# Patient Record
Sex: Female | Born: 1990 | Hispanic: No | Marital: Single | Smoking: Former smoker
Health system: Southern US, Community
[De-identification: ages and names within clinical notes are randomized; demographics above are authoritative.]

## PROBLEM LIST (undated history)

## (undated) ENCOUNTER — Inpatient Hospital Stay: Payer: Self-pay

## (undated) DIAGNOSIS — F32A Depression, unspecified: Secondary | ICD-10-CM

## (undated) DIAGNOSIS — R569 Unspecified convulsions: Secondary | ICD-10-CM

## (undated) DIAGNOSIS — F419 Anxiety disorder, unspecified: Secondary | ICD-10-CM

## (undated) HISTORY — DX: Depression, unspecified: F32.A

## (undated) HISTORY — DX: Anxiety disorder, unspecified: F41.9

---

## 2013-04-05 DIAGNOSIS — G40909 Epilepsy, unspecified, not intractable, without status epilepticus: Secondary | ICD-10-CM

## 2013-04-05 HISTORY — DX: Epilepsy, unspecified, not intractable, without status epilepticus: G40.909

## 2016-03-05 NOTE — L&D Delivery Note (Signed)
Delivery Note At 9:33 PM a viable female was delivered via VBAC, Spontaneous (Presentation: vtx Milon Dikes/loa;  ).  APGAR: 9, 9; weight  .  Pushed for 15 minutes  With good effort . Controlled delivery of shoulders and body . Vigorous cry . Delayed cord clamping    Placenta status:intact  , .  Cord:  with the following complications: left periurethral laceration .  Cord pH: not done   Anesthesia:  CLE and lidocaine local  Episiotomy:  None  Lacerations:  Left periurethral laceration  Suture Repair: 2.0 3.0 vicryl Est. Blood Loss (mL):  700  Mom to postpartum.  Baby to Couplet care / Skin to Skin.  Natalie Lawrence 01/10/2017, 10:02 PM

## 2016-03-21 ENCOUNTER — Emergency Department: Payer: Self-pay

## 2016-03-21 ENCOUNTER — Encounter: Payer: Self-pay | Admitting: Emergency Medicine

## 2016-03-21 ENCOUNTER — Emergency Department
Admission: EM | Admit: 2016-03-21 | Discharge: 2016-03-21 | Disposition: A | Discharge status: Home / Self Care | Payer: Self-pay | Attending: Emergency Medicine | Admitting: Emergency Medicine

## 2016-03-21 DIAGNOSIS — X58XXXA Exposure to other specified factors, initial encounter: Secondary | ICD-10-CM | POA: Insufficient documentation

## 2016-03-21 DIAGNOSIS — Y999 Unspecified external cause status: Secondary | ICD-10-CM | POA: Insufficient documentation

## 2016-03-21 DIAGNOSIS — Y929 Unspecified place or not applicable: Secondary | ICD-10-CM | POA: Insufficient documentation

## 2016-03-21 DIAGNOSIS — F17200 Nicotine dependence, unspecified, uncomplicated: Secondary | ICD-10-CM | POA: Insufficient documentation

## 2016-03-21 DIAGNOSIS — R55 Syncope and collapse: Secondary | ICD-10-CM | POA: Insufficient documentation

## 2016-03-21 DIAGNOSIS — Y93E1 Activity, personal bathing and showering: Secondary | ICD-10-CM | POA: Insufficient documentation

## 2016-03-21 DIAGNOSIS — S0511XA Contusion of eyeball and orbital tissues, right eye, initial encounter: Secondary | ICD-10-CM | POA: Insufficient documentation

## 2016-03-21 DIAGNOSIS — S0011XA Contusion of right eyelid and periocular area, initial encounter: Secondary | ICD-10-CM

## 2016-03-21 HISTORY — DX: Unspecified convulsions: R56.9

## 2016-03-21 LAB — BASIC METABOLIC PANEL
Anion gap: 4 — ABNORMAL LOW (ref 5–15)
BUN: 9 mg/dL (ref 6–20)
CHLORIDE: 107 mmol/L (ref 101–111)
CO2: 28 mmol/L (ref 22–32)
CREATININE: 0.79 mg/dL (ref 0.44–1.00)
Calcium: 8.8 mg/dL — ABNORMAL LOW (ref 8.9–10.3)
GFR calc Af Amer: >60 mL/min (ref >60)
Glucose, Bld: 87 mg/dL (ref 65–99)
Potassium: 3.4 mmol/L — ABNORMAL LOW (ref 3.5–5.1)
SODIUM: 139 mmol/L (ref 135–145)

## 2016-03-21 LAB — URINALYSIS, COMPLETE (UACMP) WITH MICROSCOPIC
Bacteria, UA: NONE SEEN
Bilirubin Urine: NEGATIVE
Glucose, UA: NEGATIVE mg/dL
Ketones, ur: NEGATIVE mg/dL
Nitrite: NEGATIVE
PH: 5 (ref 5.0–8.0)
Protein, ur: 30 mg/dL — AB
SPECIFIC GRAVITY, URINE: 1.028 (ref 1.005–1.030)

## 2016-03-21 LAB — CBC
HCT: 36.7 % (ref 35.0–47.0)
Hemoglobin: 11.9 g/dL — ABNORMAL LOW (ref 12.0–16.0)
MCH: 28.4 pg (ref 26.0–34.0)
MCHC: 32.5 g/dL (ref 32.0–36.0)
MCV: 87.2 fL (ref 80.0–100.0)
PLATELETS: 244 10*3/uL (ref 150–440)
RBC: 4.21 MIL/uL (ref 3.80–5.20)
RDW: 13.1 % (ref 11.5–14.5)
WBC: 10.4 10*3/uL (ref 3.6–11.0)

## 2016-03-21 LAB — PREGNANCY, URINE: Preg Test, Ur: NEGATIVE

## 2016-03-21 NOTE — ED Triage Notes (Addendum)
Pt ambulatory to triage with steady gait, no distress noted. Pt reports she was getting ready for work when she had a syncopal episode, sts she hit her head, denies LOC. Hx of epilepsy, takes medication on daily basis.

## 2016-03-21 NOTE — ED Provider Notes (Signed)
[facility] Emergency Department Provider Note   First MD Initiated Contact with Patient 03/21/16 0222     (approximate)  I have reviewed the triage vital signs and the nursing notes.   HISTORY  Chief Complaint Dizziness    HPI Natalie Lawrence is a 26 y.o. female history of seizure disorder presents to the emergency department following a syncopal episode. Patient states that following taking a shower she abruptly lost consciousness. Patient denies any preceding symptoms. Patient does admit to right facial/periorbital pain at this time. Patient states her current pain score is 3 out of 10. Patient denies any chest pain or shortness of breath or dizziness no weakness numbness or gait instability. Patient denies any visual changes. Patient denies any palpitations. Patient states that his syncopal episode was witnessed by her boyfriend who is not present in the emergency department at this time however she states that he denied with this in any seizure-like activity. She admits to being compliant with Keppra   Past Medical History:  Diagnosis Date  . Seizures (HCC)     There are no active problems to display for this patient.   Surgical history None  Prior to Admission medications   Not on File    Allergies No known drug allergies History reviewed. No pertinent family history.  Social History Social History  Substance Use Topics  . Smoking status: Current Every Day Smoker  . Smokeless tobacco: Never Used  . Alcohol use No    Review of Systems Constitutional: No fever/chills Eyes: No visual changes. ENT: No sore throat. Cardiovascular: Denies chest pain. Respiratory: Denies shortness of breath. Gastrointestinal: No abdominal pain.  No nausea, no vomiting.  No diarrhea.  No constipation. Genitourinary: Negative for dysuria. Musculoskeletal: Negative for back pain. Skin: Negative for rash. Neurological: Negative for headaches, focal  weakness or numbness.Positive for syncope   10-point ROS otherwise negative.  ____________________________________________   PHYSICAL EXAM:  VITAL SIGNS: ED Triage Vitals  Enc Vitals Group     BP 03/21/16 0100 114/90     Pulse Rate 03/21/16 0100 75     Resp 03/21/16 0100 16     Temp 03/21/16 0100 98 F (36.7 C)     Temp Source 03/21/16 0100 Oral     SpO2 03/21/16 0100 100 %     Weight 03/21/16 0101 168 lb (76.2 kg)     Height 03/21/16 0101 5\' 6"  (1.676 m)     Head Circumference --      Peak Flow --      Pain Score 03/21/16 0406 3     Pain Loc --      Pain Edu? --      Excl. in GC? --     Constitutional: Alert and oriented. Well appearing and in no acute distress. Eyes: Conjunctivae are normal. PERRL. EOMI. Head: Right periorbital ecchymoses Ears:  Healthy appearing ear canals and TMs bilaterally Nose: No congestion/rhinnorhea. Mouth/Throat: Mucous membranes are moist.  Oropharynx non-erythematous. Neck: No stridor. No cervical spine tenderness to palpation. Cardiovascular: Normal rate, regular rhythm. Good peripheral circulation. Grossly normal heart sounds. Respiratory: Normal respiratory effort.  No retractions. Lungs CTAB. Gastrointestinal: Soft and nontender. No distention.  Musculoskeletal: No lower extremity tenderness nor edema. No gross deformities of extremities. Neurologic:  Normal speech and language. No gross focal neurologic deficits are appreciated.  Skin:  Skin is warm, dry and intact. No rash noted. Psychiatric: Mood and affect are normal. Speech and behavior are normal.  ____________________________________________  LABS (all labs ordered are listed, but only abnormal results are displayed)  Labs Reviewed  BASIC METABOLIC PANEL - Abnormal; Notable for the following:       Result Value   Potassium 3.4 (*)    Calcium 8.8 (*)    Anion gap 4 (*)    All other components within normal limits  CBC - Abnormal; Notable for the following:    Hemoglobin  11.9 (*)    All other components within normal limits  URINALYSIS, COMPLETE (UACMP) WITH MICROSCOPIC - Abnormal; Notable for the following:    Color, Urine AMBER (*)    APPearance CLOUDY (*)    Hgb urine dipstick SMALL (*)    Protein, ur 30 (*)    Leukocytes, UA SMALL (*)    Squamous Epithelial / LPF TOO NUMEROUS TO COUNT (*)    All other components within normal limits  PREGNANCY, URINE  CBG MONITORING, ED   ____________________________________________  EKG  ED ECG REPORT I, Plevna N BROWN, the attending physician, personally viewed and interpreted this ECG.   Date: 03/21/2016  EKG Time: 1:07 AM  Rate: 69  Rhythm: Normal sinus rhythm  Axis: Normal  Intervals: Normal  ST&T Change: None  ____________________________________________  RADIOLOGY I, Iola N BROWN, personally viewed and evaluated these images (plain radiographs) as part of my medical decision making, as well as reviewing the written report by the radiologist.  Dg Chest 2 View  Result Date: 03/21/2016 CLINICAL DATA:  26 y/o  F; syncope with right periorbital contusion. EXAM: CHEST  2 VIEW COMPARISON:  None. FINDINGS: The heart size and mediastinal contours are within normal limits. Both lungs are clear. The visualized skeletal structures are unremarkable. IMPRESSION: No active cardiopulmonary disease. Electronically Signed   By: Mitzi Hansen M.D.   On: 03/21/2016 02:56   Ct Head Wo Contrast  Result Date: 03/21/2016 CLINICAL DATA:  Syncope with right periorbital contusion. No loss of consciousness. EXAM: CT HEAD WITHOUT CONTRAST TECHNIQUE: Contiguous axial images were obtained from the base of the skull through the vertex without intravenous contrast. COMPARISON:  None. FINDINGS: Brain: No evidence of acute infarction, hemorrhage, hydrocephalus, extra-axial collection or mass lesion/mass effect. Vascular: No hyperdense vessel or unexpected calcification. Skull: Normal. Negative for fracture or focal  lesion. Sinuses/Orbits: Paranasal sinuses and mastoid air cells are clear. The visualized orbits are unremarkable. The periorbital contusion is not seen by CT. Other: None. IMPRESSION: No acute intracranial abnormality or skull fracture. Electronically Signed   By: Rubye Oaks M.D.   On: 03/21/2016 03:30     Procedures     INITIAL IMPRESSION / ASSESSMENT AND PLAN / ED COURSE  Pertinent labs & imaging results that were available during my care of the patient were reviewed by me and considered in my medical decision making (see chart for details).  No clear etiology for the patient's syncopal episode identified the emergency department laboratory data and CT of the head is unremarkable   Clinical Course     ____________________________________________  FINAL CLINICAL IMPRESSION(S) / ED DIAGNOSES  Final diagnoses:  Syncope, unspecified syncope type  Periorbital ecchymosis of right eye, initial encounter     MEDICATIONS GIVEN DURING THIS VISIT:  Medications - No data to display   NEW OUTPATIENT MEDICATIONS STARTED DURING THIS VISIT:  There are no discharge medications for this patient.   There are no discharge medications for this patient.   There are no discharge medications for this patient.    Note:  This document was prepared  using Conservation officer, historic buildingsDragon voice recognition software and may include unintentional dictation errors.    Darci Currentandolph N Brown, MD 03/21/16 [phone]

## 2016-05-15 ENCOUNTER — Emergency Department
Admission: EM | Admit: 2016-05-15 | Discharge: 2016-05-15 | Disposition: A | Discharge status: Home / Self Care | Payer: Medicaid Other | Attending: Emergency Medicine | Admitting: Emergency Medicine

## 2016-05-15 ENCOUNTER — Encounter: Payer: Self-pay | Admitting: Emergency Medicine

## 2016-05-15 DIAGNOSIS — O209 Hemorrhage in early pregnancy, unspecified: Secondary | ICD-10-CM | POA: Insufficient documentation

## 2016-05-15 DIAGNOSIS — O4690 Antepartum hemorrhage, unspecified, unspecified trimester: Secondary | ICD-10-CM

## 2016-05-15 DIAGNOSIS — Z87891 Personal history of nicotine dependence: Secondary | ICD-10-CM | POA: Insufficient documentation

## 2016-05-15 DIAGNOSIS — Z3A01 Less than 8 weeks gestation of pregnancy: Secondary | ICD-10-CM | POA: Insufficient documentation

## 2016-05-15 DIAGNOSIS — R102 Pelvic and perineal pain: Secondary | ICD-10-CM | POA: Insufficient documentation

## 2016-05-15 LAB — ABO/RH: ABO/RH(D): B POS

## 2016-05-15 LAB — HCG, QUANTITATIVE, PREGNANCY: hCG, Beta Chain, Quant, S: 5652 m[IU]/mL — ABNORMAL HIGH (ref <5)

## 2016-05-15 NOTE — ED Triage Notes (Addendum)
Patient to ER for c/o very light vaginal spotting after fall today. Patient states she slipped on ice and fell on her butt. Patient also reports one vomiting episode that had a couple of streaks of blood after fall. Patient is currently [redacted] weeks pregnant.

## 2016-05-15 NOTE — ED Provider Notes (Signed)
[facility] Emergency Department Provider Note  ________________________________________   I have reviewed the triage vital signs and the nursing notes.   HISTORY  Chief Complaint Abdominal Pain and Vaginal Bleeding   History limited by: Not Limited   HPI Natalie Lawrence is a 26 y.o. female who presents to the emergency department today because of concerns for vaginal spotting after a fall. Patient states she fell down about 3 steps onto her buttocks. She states couple hours after that after urinating she noticed some blood. The patient is roughly [redacted] weeks pregnant. She did have ultrasound and workup performed 3 days ago at [facility]. This showed an intrauterine gestational sac. The patient denies any other vaginal discharge. In addition the patient states she has had a couple episodes of emesis today with some blood streaks in them. Patient has had emesis during this pregnancy. Denies any head injury during the fall.   Past Medical History:  Diagnosis Date  . Seizures (HCC)     There are no active problems to display for this patient.   Past Surgical History:  Procedure Laterality Date  . CESAREAN SECTION  2012    Prior to Admission medications   Not on File    Allergies Patient has no known allergies.  No family history on file.  Social History Social History  Substance Use Topics  . Smoking status: Former Games developer  . Smokeless tobacco: Never Used  . Alcohol use No    Review of Systems  Constitutional: Negative for fever. Cardiovascular: Negative for chest pain. Respiratory: Negative for shortness of breath. Gastrointestinal: Positive for pelvic cramping. Neurological: Negative for headaches, focal weakness or numbness.  10-point ROS otherwise negative.  ____________________________________________   PHYSICAL EXAM:  VITAL SIGNS: ED Triage Vitals  Enc Vitals Group     BP 05/15/16 1944 (!) 142/85     Pulse Rate 05/15/16  1944 83     Resp 05/15/16 1944 18     Temp 05/15/16 1944 99.5 F (37.5 C)     Temp Source 05/15/16 1944 Oral     SpO2 05/15/16 1944 100 %     Weight 05/15/16 1945 172 lb (78 kg)     Height 05/15/16 1945 5\' 6"  (1.676 m)     Head Circumference --      Peak Flow --      Pain Score 05/15/16 1945 10   Constitutional: Alert and oriented. Well appearing and in no distress. Eyes: Conjunctivae are normal. Normal extraocular movements. ENT   Head: Normocephalic and atraumatic.   Nose: No congestion/rhinnorhea.   Mouth/Throat: Mucous membranes are moist.   Neck: No stridor. Hematological/Lymphatic/Immunilogical: No cervical lymphadenopathy. Cardiovascular: Normal rate, regular rhythm.  No murmurs, rubs, or gallops.  Respiratory: Normal respiratory effort without tachypnea nor retractions. Breath sounds are clear and equal bilaterally. No wheezes/rales/rhonchi. Gastrointestinal: Soft and non tender. No rebound. No guarding.  Genitourinary: Deferred Musculoskeletal: Normal range of motion in all extremities. No lower extremity edema. No spinal tenderness. Neurologic:  Normal speech and language. No gross focal neurologic deficits are appreciated.  Skin:  Skin is warm, dry and intact. No rash noted. Psychiatric: Mood and affect are normal. Speech and behavior are normal. Patient exhibits appropriate insight and judgment.  ____________________________________________    LABS (pertinent positives/negatives)  Labs Reviewed  HCG, QUANTITATIVE, PREGNANCY - Abnormal; Notable for the following:       Result Value   hCG, Beta Chain, Quant, S 5,652 (*)    All other components  within normal limits  ABO/RH     ____________________________________________   EKG  None  ____________________________________________    RADIOLOGY  None  ____________________________________________   PROCEDURES  Procedures  ____________________________________________   INITIAL  IMPRESSION / ASSESSMENT AND PLAN / ED COURSE  Pertinent labs & imaging results that were available during my care of the patient were reviewed by me and considered in my medical decision making (see chart for details).  Patient presented to the emergency department today with concerns for vaginal spotting after a fall. He hCG today is roughly double what it was 3 days ago. Patient's ultrasound did show an intrauterine gestational sac. I had a discussion with the patient. I discussed with patient that the bleeding could represent an early miscarriage. I did discuss ultrasound patient however stated even if we saw gestational sac would be hard to determine what the final course will be. Discussed the more [place] be following the beta hCG. Did recommend patient contact her primary OB/GYN doctor and have them repeat beta hCG and 48 hours. Patient felt comfortable with the plan.  ____________________________________________   FINAL CLINICAL IMPRESSION(S) / ED DIAGNOSES  Final diagnoses:  Vaginal bleeding in pregnancy     Note: This dictation was prepared with Dragon dictation. Any transcriptional errors that result from this process are unintentional     Phineas SemenGraydon Tamanika Heiney, MD 05/15/16 2119

## 2016-05-15 NOTE — ED Notes (Signed)
Pt states she fell on stairs today and after started having mid abd pain . Pt states she noticed blood when wiping after urination and also vomited blood x2 . Pt ambulatory, NAD noted

## 2016-05-15 NOTE — Discharge Instructions (Signed)
Please seek medical attention for any high fevers, chest pain, shortness of breath, change in behavior, persistent vomiting, bloody stool or any other new or concerning symptoms.  

## 2016-05-18 ENCOUNTER — Other Ambulatory Visit: Payer: Self-pay | Admitting: Obstetrics and Gynecology

## 2016-05-18 DIAGNOSIS — O200 Threatened abortion: Secondary | ICD-10-CM

## 2016-05-21 ENCOUNTER — Other Ambulatory Visit: Payer: Self-pay | Admitting: Obstetrics and Gynecology

## 2016-05-25 ENCOUNTER — Other Ambulatory Visit: Payer: Self-pay | Admitting: Obstetrics and Gynecology

## 2016-05-25 DIAGNOSIS — O200 Threatened abortion: Secondary | ICD-10-CM

## 2016-05-31 ENCOUNTER — Ambulatory Visit
Admission: RE | Admit: 2016-05-31 | Discharge: 2016-05-31 | Disposition: A | Discharge status: Home / Self Care | Payer: Self-pay | Source: Ambulatory Visit | Attending: Obstetrics and Gynecology | Admitting: Obstetrics and Gynecology

## 2016-05-31 DIAGNOSIS — Z3A01 Less than 8 weeks gestation of pregnancy: Secondary | ICD-10-CM | POA: Insufficient documentation

## 2016-05-31 DIAGNOSIS — O200 Threatened abortion: Secondary | ICD-10-CM

## 2016-07-23 DIAGNOSIS — R569 Unspecified convulsions: Secondary | ICD-10-CM | POA: Insufficient documentation

## 2016-07-25 ENCOUNTER — Other Ambulatory Visit: Payer: Self-pay | Admitting: Obstetrics and Gynecology

## 2016-07-25 DIAGNOSIS — Z3689 Encounter for other specified antenatal screening: Secondary | ICD-10-CM

## 2016-08-13 ENCOUNTER — Institutional Professional Consult (permissible substitution): Payer: Self-pay

## 2016-08-13 ENCOUNTER — Other Ambulatory Visit: Payer: Self-pay

## 2016-08-16 ENCOUNTER — Other Ambulatory Visit: Payer: Self-pay

## 2016-08-16 ENCOUNTER — Inpatient Hospital Stay: Admission: RE | Admit: 2016-08-16 | Payer: Self-pay | Source: Ambulatory Visit

## 2016-08-21 DIAGNOSIS — O0992 Supervision of high risk pregnancy, unspecified, second trimester: Secondary | ICD-10-CM | POA: Insufficient documentation

## 2016-09-10 ENCOUNTER — Ambulatory Visit: Payer: Self-pay | Attending: Obstetrics and Gynecology

## 2016-09-13 ENCOUNTER — Other Ambulatory Visit: Payer: Self-pay | Admitting: Neurology

## 2016-09-13 DIAGNOSIS — G40909 Epilepsy, unspecified, not intractable, without status epilepticus: Secondary | ICD-10-CM

## 2016-09-20 ENCOUNTER — Ambulatory Visit: Payer: Medicaid Other

## 2016-12-28 ENCOUNTER — Observation Stay
Admission: EM | Admit: 2016-12-28 | Discharge: 2016-12-28 | Disposition: A | Discharge status: Home / Self Care | Payer: Medicaid Other | Attending: Obstetrics and Gynecology | Admitting: Obstetrics and Gynecology

## 2016-12-28 ENCOUNTER — Encounter: Payer: Self-pay | Admitting: *Deleted

## 2016-12-28 DIAGNOSIS — Z3689 Encounter for other specified antenatal screening: Secondary | ICD-10-CM

## 2016-12-28 DIAGNOSIS — O99353 Diseases of the nervous system complicating pregnancy, third trimester: Principal | ICD-10-CM | POA: Insufficient documentation

## 2016-12-28 DIAGNOSIS — Z3A37 37 weeks gestation of pregnancy: Secondary | ICD-10-CM | POA: Diagnosis not present

## 2016-12-28 NOTE — Final Progress Note (Signed)
   Triage visit for NST   Natalie Lawrence is a 26 y.o. G3P2003. She is at 2854w6d gestation. She presents for a scheduled NST.  Indication: Seizure disorder  S: Resting comfortably. no CTX, no VB. Active fetal movement. Concerned about nothing O:  BP 102/69 (BP Location: Left Arm)   Pulse 75   Resp 18   LMP 03/27/2016 (Exact Date)  No results found for this or any previous visit (from the past 48 hour(s)).   Gen: NAD, AAOx3      Abd: FNTTP      Ext: Non-tender, Nonedmeatous    NST  Baseline: 120 Variability: moderate Accelerations present x >2 Decelerations absent Time 20mins  Interpretation: reactive NST, category 1 tracing  A/P:  26 y.o. G3P2003 4254w6d with seizure disorder at term.    Reactive NST, with moderate variability and accelerations, no decels  Fetal Wellbeing: Reassuring  D/c home stable, precautions reviewed, follow-up as scheduled.     ----- Christeen DouglasBethany Quantrell Splitt, MD MPH Attending Obstetrician and Gynecologist [facility], Department of OB/GYN [facility]

## 2016-12-28 NOTE — OB Triage Note (Signed)
Scheduled NST 

## 2016-12-28 NOTE — Discharge Instructions (Signed)
Drink plenty of fluid and get plenty of rest. Call your provider for any other concerns. Keep your next scheduled appointment

## 2017-01-10 ENCOUNTER — Inpatient Hospital Stay
Admission: EM | Admit: 2017-01-10 | Discharge: 2017-01-12 | DRG: 806 | Disposition: A | Discharge status: Home / Self Care | Payer: Medicaid Other | Attending: Obstetrics and Gynecology | Admitting: Obstetrics and Gynecology

## 2017-01-10 ENCOUNTER — Other Ambulatory Visit: Payer: Self-pay

## 2017-01-10 ENCOUNTER — Inpatient Hospital Stay: Payer: Medicaid Other | Admitting: Anesthesiology

## 2017-01-10 DIAGNOSIS — Z3A39 39 weeks gestation of pregnancy: Secondary | ICD-10-CM | POA: Diagnosis not present

## 2017-01-10 DIAGNOSIS — G40909 Epilepsy, unspecified, not intractable, without status epilepticus: Secondary | ICD-10-CM | POA: Diagnosis present

## 2017-01-10 DIAGNOSIS — O34211 Maternal care for low transverse scar from previous cesarean delivery: Secondary | ICD-10-CM | POA: Diagnosis present

## 2017-01-10 DIAGNOSIS — O4292 Full-term premature rupture of membranes, unspecified as to length of time between rupture and onset of labor: Secondary | ICD-10-CM | POA: Diagnosis present

## 2017-01-10 DIAGNOSIS — Z87891 Personal history of nicotine dependence: Secondary | ICD-10-CM

## 2017-01-10 DIAGNOSIS — O99824 Streptococcus B carrier state complicating childbirth: Secondary | ICD-10-CM | POA: Diagnosis present

## 2017-01-10 DIAGNOSIS — O99354 Diseases of the nervous system complicating childbirth: Secondary | ICD-10-CM | POA: Diagnosis present

## 2017-01-10 DIAGNOSIS — O479 False labor, unspecified: Secondary | ICD-10-CM | POA: Diagnosis present

## 2017-01-10 LAB — TYPE AND SCREEN
ABO/RH(D): B POS
Antibody Screen: NEGATIVE

## 2017-01-10 LAB — CBC
HEMATOCRIT: 36.6 % (ref 35.0–47.0)
HEMOGLOBIN: 11.8 g/dL — AB (ref 12.0–16.0)
MCH: 28.2 pg (ref 26.0–34.0)
MCHC: 32.3 g/dL (ref 32.0–36.0)
MCV: 87.4 fL (ref 80.0–100.0)
Platelets: 164 10*3/uL (ref 150–440)
RBC: 4.19 MIL/uL (ref 3.80–5.20)
RDW: 13.8 % (ref 11.5–14.5)
WBC: 13.1 10*3/uL — ABNORMAL HIGH (ref 3.6–11.0)

## 2017-01-10 MED ORDER — LACTATED RINGERS IV SOLN: 500.0000 mL | Freq: Once | INTRAVENOUS | Status: DC

## 2017-01-10 MED ORDER — PHENYLEPHRINE 40 MCG/ML (10ML) SYRINGE FOR IV PUSH (FOR BLOOD PRESSURE SUPPORT)
80.0000 ug | PREFILLED_SYRINGE | INTRAVENOUS | Status: DC | PRN
  Filled 2017-01-10: qty 5

## 2017-01-10 MED ORDER — METHYLERGONOVINE MALEATE 0.2 MG/ML IJ SOLN
0.2000 mg | INTRAMUSCULAR | Status: DC | PRN
  Administered 2017-01-10: 0.2 mg via INTRAMUSCULAR

## 2017-01-10 MED ORDER — ONDANSETRON HCL 4 MG/2ML IJ SOLN: 4.0000 mg | Freq: Four times a day (QID) | INTRAMUSCULAR | Status: DC | PRN

## 2017-01-10 MED ORDER — OXYTOCIN 40 UNITS IN LACTATED RINGERS INFUSION - SIMPLE MED
INTRAVENOUS | Status: AC
  Administered 2017-01-10: 500 mL via INTRAVENOUS
  Filled 2017-01-10: qty 1000

## 2017-01-10 MED ORDER — OXYTOCIN 40 UNITS IN LACTATED RINGERS INFUSION - SIMPLE MED
1.0000 m[IU]/min | INTRAVENOUS | Status: DC
  Administered 2017-01-10: 1 m[IU]/min via INTRAVENOUS

## 2017-01-10 MED ORDER — METHYLERGONOVINE MALEATE 0.2 MG PO TABS
0.2000 mg | ORAL_TABLET | ORAL | Status: DC | PRN
  Filled 2017-01-10: qty 1

## 2017-01-10 MED ORDER — OXYTOCIN BOLUS FROM INFUSION
500.0000 mL | Freq: Once | INTRAVENOUS | Status: AC
  Administered 2017-01-10: 500 mL via INTRAVENOUS

## 2017-01-10 MED ORDER — SODIUM CHLORIDE 0.9 % IV SOLN
INTRAVENOUS | Status: AC
  Filled 2017-01-10: qty 1000

## 2017-01-10 MED ORDER — AMMONIA AROMATIC IN INHA
RESPIRATORY_TRACT | Status: AC
  Filled 2017-01-10: qty 10

## 2017-01-10 MED ORDER — LIDOCAINE HCL (PF) 1 % IJ SOLN
INTRAMUSCULAR | Status: DC | PRN
  Administered 2017-01-10: 3 mL

## 2017-01-10 MED ORDER — FENTANYL 2.5 MCG/ML W/ROPIVACAINE 0.15% IN NS 100 ML EPIDURAL (ARMC)
EPIDURAL | Status: AC
Start: 2017-01-10 — End: 2017-01-10
  Filled 2017-01-10: qty 100

## 2017-01-10 MED ORDER — SODIUM CHLORIDE 0.9 % IV SOLN: 2.0000 g | Freq: Four times a day (QID) | INTRAVENOUS | Status: DC

## 2017-01-10 MED ORDER — LACTATED RINGERS IV SOLN
500.0000 mL | INTRAVENOUS | Status: DC | PRN
  Administered 2017-01-10: 500 mL via INTRAVENOUS

## 2017-01-10 MED ORDER — EPHEDRINE 5 MG/ML INJ
10.0000 mg | INTRAVENOUS | Status: DC | PRN
  Filled 2017-01-10: qty 2

## 2017-01-10 MED ORDER — BUTORPHANOL TARTRATE 1 MG/ML IJ SOLN: 1.0000 mg | INTRAMUSCULAR | Status: DC | PRN

## 2017-01-10 MED ORDER — ACETAMINOPHEN 325 MG PO TABS: 650.0000 mg | ORAL_TABLET | ORAL | Status: DC | PRN

## 2017-01-10 MED ORDER — AMPICILLIN SODIUM 2 G IJ SOLR
2.0000 g | Freq: Once | INTRAMUSCULAR | Status: AC
  Administered 2017-01-10: 2 g via INTRAVENOUS

## 2017-01-10 MED ORDER — LIDOCAINE HCL (PF) 1 % IJ SOLN: 30.0000 mL | INTRAMUSCULAR | Status: DC | PRN

## 2017-01-10 MED ORDER — MISOPROSTOL 200 MCG PO TABS
ORAL_TABLET | ORAL | Status: AC
  Filled 2017-01-10: qty 4

## 2017-01-10 MED ORDER — LIDOCAINE-EPINEPHRINE (PF) 1.5 %-1:200000 IJ SOLN
INTRAMUSCULAR | Status: DC | PRN
  Administered 2017-01-10: 3 mL via PERINEURAL

## 2017-01-10 MED ORDER — LIDOCAINE HCL (PF) 1 % IJ SOLN
INTRAMUSCULAR | Status: AC
  Filled 2017-01-10: qty 30

## 2017-01-10 MED ORDER — BUPIVACAINE HCL (PF) 0.25 % IJ SOLN
INTRAMUSCULAR | Status: DC | PRN
  Administered 2017-01-10: 5 mL via EPIDURAL
  Administered 2017-01-10: 2 mL via EPIDURAL

## 2017-01-10 MED ORDER — METHYLERGONOVINE MALEATE 0.2 MG/ML IJ SOLN
INTRAMUSCULAR | Status: AC
  Administered 2017-01-10: 0.2 mg via INTRAMUSCULAR
  Filled 2017-01-10: qty 1

## 2017-01-10 MED ORDER — FENTANYL 2.5 MCG/ML W/ROPIVACAINE 0.15% IN NS 100 ML EPIDURAL (ARMC)
12.0000 mL/h | EPIDURAL | Status: DC
  Administered 2017-01-10 (×2): 12 mL/h via EPIDURAL

## 2017-01-10 MED ORDER — LACTATED RINGERS IV SOLN
INTRAVENOUS | Status: DC
  Administered 2017-01-10: 1000 mL via INTRAVENOUS

## 2017-01-10 MED ORDER — OXYTOCIN 40 UNITS IN LACTATED RINGERS INFUSION - SIMPLE MED: 2.5000 [IU]/h | INTRAVENOUS | Status: DC

## 2017-01-10 MED ORDER — SODIUM CHLORIDE 0.9 % IV SOLN
2.0000 g | INTRAVENOUS | Status: DC
  Administered 2017-01-10: 2 g via INTRAVENOUS
  Filled 2017-01-10 (×5): qty 2000

## 2017-01-10 MED ORDER — HYDROCODONE-ACETAMINOPHEN 5-325 MG PO TABS: 1.0000 | ORAL_TABLET | ORAL | Status: DC | PRN

## 2017-01-10 MED ORDER — SODIUM CHLORIDE 0.9 % IV SOLN
2.0000 g | Freq: Once | INTRAVENOUS | Status: AC
  Administered 2017-01-10: 2 g via INTRAVENOUS
  Filled 2017-01-10: qty 2000

## 2017-01-10 MED ORDER — TERBUTALINE SULFATE 1 MG/ML IJ SOLN: 0.2500 mg | Freq: Once | INTRAMUSCULAR | Status: DC | PRN

## 2017-01-10 MED ORDER — SODIUM CHLORIDE 0.9 % IV SOLN
INTRAVENOUS | Status: AC
  Filled 2017-01-10: qty 2000

## 2017-01-10 MED ORDER — DIPHENHYDRAMINE HCL 50 MG/ML IJ SOLN: 12.5000 mg | INTRAMUSCULAR | Status: DC | PRN

## 2017-01-10 MED ORDER — SOD CITRATE-CITRIC ACID 500-334 MG/5ML PO SOLN: 30.0000 mL | ORAL | Status: DC | PRN

## 2017-01-10 MED ORDER — OXYTOCIN 10 UNIT/ML IJ SOLN
INTRAMUSCULAR | Status: AC
  Filled 2017-01-10: qty 2

## 2017-01-10 NOTE — Progress Notes (Signed)
Patient ID: Natalie RobinsonVanessa D Lawrence, female   DOB: [birth year], 26 y.o.   MRN: [id number] cx 3 cm / 75% /-1  CLE inplace   inadequate ctx pattern  Reassuring fetal monitoring  Start low dose Pitocin

## 2017-01-10 NOTE — H&P (Signed)
Deatra RobinsonVanessa D Balaban is a 26 y.o. female presenting for ROM and interested in TOL 39+5 weeks . OB History    Gravida Para Term Preterm AB Living   3 2 2     3    SAB TAB Ectopic Multiple Live Births         1 3     Past Medical History:  Diagnosis Date  . Seizures (HCC)    Past Surgical History:  Procedure Laterality Date  . CESAREAN SECTION  2012   Family History: family history includes Cancer in her paternal grandmother; Diabetes in her paternal grandmother; Hypertension in her paternal grandmother. Social History:  reports that she has quit smoking. she has never used smokeless tobacco. She reports that she does not drink alcohol or use drugs.     Maternal Diabetes: No Genetic Screening: Declined Maternal Ultrasounds/Referrals: Normal Fetal Ultrasounds or other Referrals:  None Maternal Substance Abuse:  No Significant Maternal Medications:  None Significant Maternal Lab Results:  None Other Comments:  None  ROS History Dilation: 3 Effacement (%): 60 Station: -2 Exam by:: L Neese, RN Blood pressure (!) 102/58, pulse 72, temperature 98.5 F (36.9 C), temperature source Oral, resp. rate 18, height 5\' 6"  (1.676 m), weight 106.6 kg (235 lb), last menstrual period 03/27/2016, SpO2 98 %. Exam Physical Exam  Prenatal labs: ABO, Rh: --/--/B POS (11/08 16100956) Antibody: NEG (11/08 0956) Rubella:  imm, varicella Imm RPR:   neg HBsAg:   neg HIV:   neg  GBS:   +  Assessment/Plan: SROM , term  Reassuring fetal monitoring  PT desires TOLAC .  On ampicillin for GBS prophylaxis    Ihor Austinhomas J Schermerhorn 01/10/2017, 5:15 PM

## 2017-01-10 NOTE — Discharge Summary (Signed)
Obstetric Discharge Summary   Patient ID: Patient Name: Natalie RobinsonVanessa D Lawrence DOB: September 19, 1990 MRN: [id number]  Date of Admission: 01/10/2017 Date of Delivery: 01/12/2017  Delivered IO:NGEXBMby:Thomas Shela CommonsJ Schermerhorn MD Date of Discharge: 01/12/2017  [facility]  WUX:[id number]:Patient's last menstrual period was 03/27/2016 (exact date). EDC Estimated Date of Delivery: 01/12/17 Gestational Age at Delivery: 7322w5d   Antepartum complications: h/o seizures restarted on keppra and stopped  Admitting Diagnosis: PROM 39+5  Augmentation: Pitocin Complications: None Delivery Type: vaginal birth after cesarean (VBAC) Anesthesia: epidural Placenta: sponatneous Laceration: left periurethral Episiotomy: none  Newborn Data: Live born female  Birth Weight:   APGAR: 9, 9  Newborn Delivery   Birth date/time:  01/10/2017 21:33:00 Delivery type:  VBAC, Spontaneous     Postpartum Course  Patient had an uncomplicated postpartum course.  By time of discharge on PPD#2, her pain was controlled on oral pain medications; she had appropriate lochia and was ambulating, voiding without difficulty and tolerating regular diet.  She was deemed stable for discharge to home.       Labs: CBC Latest Ref Rng & Units 01/12/2017 01/11/2017 01/10/2017  WBC 3.6 - 11.0 K/uL 16.1(H) 17.4(H) 13.1(H)  Hemoglobin 12.0 - 16.0 g/dL 10.3(L) 11.0(L) 11.8(L)  Hematocrit 35.0 - 47.0 % 32.0(L) 33.6(L) 36.6  Platelets 150 - 440 K/uL 167 154 164   B POS  Physical exam:  BP 101/69   Pulse 77   Temp 98.4 F (36.9 C) (Oral)   Resp 20   Ht 5\' 6"  (1.676 m)   Wt 106.6 kg (235 lb)   LMP 03/27/2016 (Exact Date)   SpO2 97%   BMI 37.93 kg/m  General: alert and no distress Pulm: normal respiratory effort Lochia: appropriate Abdomen: soft, NT Uterine Fundus: firm, below umbilicus Extremities: No evidence of DVT seen on physical exam. No lower extremity edema.   Disposition: stable, discharge to home Baby Disposition:  home with mom  Contraception: tbd  Prenatal Labs:  ABO, Rh: --/--/B POS (11/08 [id number]) Antibody: NEG (11/08 0956) Rubella:  imm, varicella Imm RPR:   neg HBsAg:   neg HIV:   neg  GBS:   +  Rh Immune globulin given: n/a  Plan:  Natalie Lawrence was discharged to home in good condition. [facility] with delivery provider in 4 weeks  Discharge Instructions: Per After Visit Summary. Activity: Advance as tolerated. Pelvic rest for 6 weeks.   Diet: Regular Discharge Medications: Allergies as of 01/12/2017   No Known Allergies     Medication List    TAKE these medications   folic acid 1 MG tablet Commonly known as:  FOLVITE Take 1 mg by mouth daily.   levETIRAcetam 500 MG tablet Commonly known as:  KEPPRA Take 500 mg by mouth 2 (two) times daily.   multivitamin-prenatal 27-0.8 MG Tabs tablet Take 1 tablet by mouth daily at 12 noon.      Outpatient follow up:  Follow-up Information    [facility].   Why:  [facility] in [place] not accepting new patients.  Please have parents call [facility] on Monday for appt.(office was closed on Friday) Contact information: [address] [place] [address] [phone]        Schermerhorn, Ihor Austinhomas J, MD Follow up in 4 week(s).   Specialty:  Obstetrics and Gynecology Contact information: [address] [facility] [place] [address] [phone]  Signed:  Genia DelMargaret Theon Sobotka

## 2017-01-10 NOTE — Progress Notes (Signed)
Natalie RobinsonVanessa D Lawrence is a 26 y.o. G3P2003 at 4850w5d  Subjective: Pitocin at 6 mu/ min -  Pt is comfortable  Objective: BP (!) 118/57   Pulse 76   Temp 98.3 F (36.8 C) (Oral)   Resp 18   Ht 5\' 6"  (1.676 m)   Wt 106.6 kg (235 lb)   LMP 03/27/2016 (Exact Date)   SpO2 97%   BMI 37.93 kg/m  I/O last 3 completed shifts: In: 500 [I.V.:500] Out: 750 [Urine:400; Emesis/NG output:350] No intake/output data recorded.  FHT:  FHR: 130 bpm, variability: moderate,  accelerations:  Present,  decelerations:  Absent UC:   regular, every 2-3 minutes SVE:   Dilation: 4.5 Effacement (%): 70 Station: -2 Exam by:: TJS  cx exam  4-5 cm / 100/ 0 station  Labs: Lab Results  Component Value Date   WBC 13.1 (H) 01/10/2017   HGB 11.8 (L) 01/10/2017   HCT 36.6 01/10/2017   MCV 87.4 01/10/2017   PLT 164 01/10/2017    Assessment / Plan: inadequate ctx pattern by MVU , but cervical change occuring . IUPC replaced continue pitocin to acheive adequate pattern     Suzy Bouchardhomas J Nomar Broad 01/10/2017, 8:29 PM

## 2017-01-10 NOTE — Anesthesia Procedure Notes (Signed)
Epidural Patient location during procedure: OB Start time: 01/10/2017 3:31 PM End time: 01/10/2017 3:50 PM  Staffing Anesthesiologist: Lenard SimmerKarenz, Andrew, MD Resident/CRNA: Ginger CarneMichelet, Ellwyn Ergle, CRNA Performed: resident/CRNA   Preanesthetic Checklist Completed: patient identified, site marked, surgical consent, pre-op evaluation, timeout performed, IV checked, risks and benefits discussed and monitors and equipment checked  Epidural Patient position: sitting Prep: Betadine Patient monitoring: heart rate, continuous pulse ox and blood pressure Approach: midline Location: L4-L5 Injection technique: LOR saline  Needle:  Needle type: Tuohy  Needle gauge: 17 G Needle length: 9 cm and 9 Needle insertion depth: 9 cm Catheter type: closed end flexible Catheter size: 19 Gauge Catheter at skin depth: 14 cm Test dose: negative and 1.5% lidocaine with Epi 1:200 K  Assessment Sensory level: T10 Events: blood not aspirated, injection not painful, no injection resistance, negative IV test and no paresthesia  Additional Notes Pt. Evaluated and documentation done after procedure finished. Patient identified. Risks/Benefits/Options discussed with patient including but not limited to bleeding, infection, nerve damage, paralysis, failed block, incomplete pain control, headache, blood pressure changes, nausea, vomiting, reactions to medication both or allergic, itching and postpartum back pain. Confirmed with bedside nurse the patient's most recent platelet count. Confirmed with patient that they are not currently taking any anticoagulation, have any bleeding history or any family history of bleeding disorders. Patient expressed understanding and wished to proceed. All questions were answered. Sterile technique was used throughout the entire procedure. Please see nursing notes for vital signs. Test dose was given through epidural catheter and negative prior to continuing to dose epidural or start infusion.  Warning signs of high block given to the patient including shortness of breath, tingling/numbness in hands, complete motor block, or any concerning symptoms with instructions to call for help. Patient was given instructions on fall risk and not to get out of bed. All questions and concerns addressed with instructions to call with any issues or inadequate analgesia.   Patient tolerated the insertion well without immediate complications.Reason for block:procedure for pain

## 2017-01-10 NOTE — Anesthesia Preprocedure Evaluation (Signed)
Anesthesia Evaluation  Patient identified by MRN, date of birth, ID band Patient awake    Reviewed: Allergy & Precautions, H&P , NPO status   History of Anesthesia Complications Negative for: history of anesthetic complications  Airway Mallampati: II   Neck ROM: full  Mouth opening: Limited Mouth Opening  Dental  (+) Teeth Intact   Pulmonary neg pulmonary ROS, former smoker,    Pulmonary exam normal        Cardiovascular Exercise Tolerance: Good negative cardio ROS Normal cardiovascular exam     Neuro/Psych Seizures -, Well Controlled,  Last seizure September 2018 negative psych ROS   GI/Hepatic Neg liver ROS, Controlled,  Endo/Other  negative endocrine ROS  Renal/GU negative Renal ROS  negative genitourinary   Musculoskeletal   Abdominal   Peds  Hematology negative hematology ROS (+)   Anesthesia Other Findings   Reproductive/Obstetrics (+) Pregnancy                             Anesthesia Physical Anesthesia Plan  ASA: II  Anesthesia Plan: Epidural   Post-op Pain Management:    Induction:   PONV Risk Score and Plan:   Airway Management Planned:   Additional Equipment:   Intra-op Plan:   Post-operative Plan:   Informed Consent:   Dental Advisory Given  Plan Discussed with: Anesthesiologist  Anesthesia Plan Comments:         Anesthesia Quick Evaluation

## 2017-01-11 LAB — CBC
HEMATOCRIT: 33.6 % — AB (ref 35.0–47.0)
HEMOGLOBIN: 11 g/dL — AB (ref 12.0–16.0)
MCH: 28.4 pg (ref 26.0–34.0)
MCHC: 32.8 g/dL (ref 32.0–36.0)
MCV: 86.6 fL (ref 80.0–100.0)
Platelets: 154 10*3/uL (ref 150–440)
RBC: 3.87 MIL/uL (ref 3.80–5.20)
RDW: 13.8 % (ref 11.5–14.5)
WBC: 17.4 10*3/uL — ABNORMAL HIGH (ref 3.6–11.0)

## 2017-01-11 LAB — RPR: RPR: NONREACTIVE

## 2017-01-11 MED ORDER — MAGNESIUM HYDROXIDE 400 MG/5ML PO SUSP: 30.0000 mL | ORAL | Status: DC | PRN

## 2017-01-11 MED ORDER — ZOLPIDEM TARTRATE 5 MG PO TABS: 5.0000 mg | ORAL_TABLET | Freq: Every evening | ORAL | Status: DC | PRN

## 2017-01-11 MED ORDER — PRENATAL MULTIVITAMIN CH
1.0000 | ORAL_TABLET | Freq: Every day | ORAL | Status: DC
  Administered 2017-01-11 – 2017-01-12 (×2): 1 via ORAL
  Filled 2017-01-11 (×2): qty 1

## 2017-01-11 MED ORDER — BENZOCAINE-MENTHOL 20-0.5 % EX AERO: 1.0000 "application " | INHALATION_SPRAY | CUTANEOUS | Status: DC | PRN

## 2017-01-11 MED ORDER — ONDANSETRON HCL 4 MG PO TABS
4.0000 mg | ORAL_TABLET | ORAL | Status: DC | PRN
Start: 2017-01-11 — End: 2017-01-12

## 2017-01-11 MED ORDER — MEASLES, MUMPS & RUBELLA VAC ~~LOC~~ INJ
0.5000 mL | INJECTION | Freq: Once | SUBCUTANEOUS | Status: DC
  Filled 2017-01-11: qty 0.5

## 2017-01-11 MED ORDER — ACETAMINOPHEN 325 MG PO TABS: 650.0000 mg | ORAL_TABLET | ORAL | Status: DC | PRN

## 2017-01-11 MED ORDER — DIPHENHYDRAMINE HCL 25 MG PO CAPS: 25.0000 mg | ORAL_CAPSULE | Freq: Four times a day (QID) | ORAL | Status: DC | PRN

## 2017-01-11 MED ORDER — FERROUS SULFATE 325 (65 FE) MG PO TABS
325.0000 mg | ORAL_TABLET | Freq: Two times a day (BID) | ORAL | Status: DC
  Administered 2017-01-11 – 2017-01-12 (×2): 325 mg via ORAL
  Filled 2017-01-11 (×3): qty 1

## 2017-01-11 MED ORDER — SENNOSIDES-DOCUSATE SODIUM 8.6-50 MG PO TABS
2.0000 | ORAL_TABLET | ORAL | Status: DC
  Administered 2017-01-12: 2 via ORAL
  Filled 2017-01-11 (×2): qty 2

## 2017-01-11 MED ORDER — ONDANSETRON HCL 4 MG/2ML IJ SOLN: 4.0000 mg | INTRAMUSCULAR | Status: DC | PRN

## 2017-01-11 MED ORDER — WITCH HAZEL-GLYCERIN EX PADS: 1.0000 "application " | MEDICATED_PAD | CUTANEOUS | Status: DC | PRN

## 2017-01-11 MED ORDER — COCONUT OIL OIL: 1.0000 "application " | TOPICAL_OIL | Status: DC | PRN

## 2017-01-11 MED ORDER — SIMETHICONE 80 MG PO CHEW: 80.0000 mg | CHEWABLE_TABLET | ORAL | Status: DC | PRN

## 2017-01-11 MED ORDER — DIBUCAINE 1 % RE OINT: 1.0000 "application " | TOPICAL_OINTMENT | RECTAL | Status: DC | PRN

## 2017-01-11 MED ORDER — IBUPROFEN 600 MG PO TABS
600.0000 mg | ORAL_TABLET | Freq: Four times a day (QID) | ORAL | Status: DC
  Administered 2017-01-11: 600 mg via ORAL
  Filled 2017-01-11 (×2): qty 1

## 2017-01-11 NOTE — Progress Notes (Signed)
Post Partum Day 1 Subjective: no complaints, up ad lib and voiding  Objective: Blood pressure 106/73, pulse 93, temperature 98.9 F (37.2 C), temperature source Oral, resp. rate 18, height 5\' 6"  (1.676 m), weight 106.6 kg (235 lb), last menstrual period 03/27/2016, SpO2 94 %.  Physical Exam:  General: alert, cooperative and appears stated age 19Lochia: appropriate CV: RRR Pulm: CTAB Uterine Fundus: firm DVT Evaluation: No evidence of DVT seen on physical exam. Negative Homan's sign. No cords or calf tenderness.  Recent Labs    01/10/17 0956 01/11/17 0549  HGB 11.8* 11.0*  HCT 36.6 33.6*    Assessment/Plan: Plan for discharge tomorrow, Breastfeeding and Lactation consult   LOS: 1 day   Natalie DouglasBethany Jamisyn Lawrence 01/11/2017, 3:29 PM

## 2017-01-11 NOTE — Anesthesia Postprocedure Evaluation (Signed)
Anesthesia Post Note  Patient: Natalie Lawrence  Procedure(s) Performed: AN AD HOC LABOR EPIDURAL  Patient location during evaluation: Mother Baby Anesthesia Type: Epidural Level of consciousness: awake, awake and alert and oriented Pain management: pain level controlled Vital Signs Assessment: post-procedure vital signs reviewed and stable Respiratory status: nonlabored ventilation, spontaneous breathing and respiratory function stable Cardiovascular status: blood pressure returned to baseline and stable Postop Assessment: no headache and no backache Anesthetic complications: no     Last Vitals:  Vitals:   01/11/17 0157 01/11/17 0601  BP: 121/76 105/72  Pulse: 83 77  Resp:    Temp: 36.9 C 36.8 C  SpO2: 98% 98%    Last Pain:  Vitals:   01/11/17 0601  TempSrc: Oral  PainSc:                  Ginger CarneStephanie Meika Earll

## 2017-01-12 LAB — CBC
HCT: 32 % — ABNORMAL LOW (ref 35.0–47.0)
Hemoglobin: 10.3 g/dL — ABNORMAL LOW (ref 12.0–16.0)
MCH: 28.4 pg (ref 26.0–34.0)
MCHC: 32.3 g/dL (ref 32.0–36.0)
MCV: 87.7 fL (ref 80.0–100.0)
PLATELETS: 167 10*3/uL (ref 150–440)
RBC: 3.64 MIL/uL — AB (ref 3.80–5.20)
RDW: 13.7 % (ref 11.5–14.5)
WBC: 16.1 10*3/uL — ABNORMAL HIGH (ref 3.6–11.0)

## 2017-01-12 NOTE — Progress Notes (Signed)
Upon review of D/C instructions, discussed with pt not taking Keppra during pregnancy. Pt stated she did not want to risk harm to her baby. Pt stated she has had a few seizures during her pregnancy, per pt "none were that serious" and last one was in September. Pt does plan to resume taking medications and her MD is aware.

## 2017-01-12 NOTE — Progress Notes (Signed)
Reviewed D/C instructions and follow up appointments with patient. Pt verbalized understanding.  Verified ID with mom and baby. Awaiting ride home with father to pick up after lunch, is having car worked on..Marland Kitchen

## 2017-01-12 NOTE — Progress Notes (Signed)
  January 12, 2017  Patient: Natalie Lawrence  Date of Birth: [birth year]  Date of Visit: 01/10/2017    To Whom It May Concern:  Natalie Lawrence was seen and treated in our womens care center on 11/8/2018and discharged on 01/12/17. Rolm GalaCory Badgett was present during this time as a support person for Natalie Lawrence  may return to work on when cleared by MD..  Sincerely,   Rea CollegeJessica,RN

## 2017-01-12 NOTE — Discharge Instructions (Signed)
Discharge instructions:   Call office if you have any of the following: headache, visual changes, fever >101.0 F, chills, breast concerns, excessive vaginal bleeding, incision drainage or problems, leg pain or redness, depression or any other concerns.   Activity: Do not lift > 10 lbs for 6 weeks.  No intercourse or tampons for 6 weeks.  No driving for 1-2 weeks.   Call your doctor for increased pain or vaginal bleeding, temperature above 101.0, depression, or concerns.  No strenuous activity or heavy lifting for 6 weeks.  No intercourse, tampons, douching, or enemas for 6 weeks.  No tub baths-showers only.  No driving for 2 weeks or while taking pain medications.  Continue prenatal vitamin and iron.  Increase calories and fluids while breastfeeding.  **Please discuss restarting seizure medications with MD as soon as possible. You may have a slight fever when your milk comes in, but it should go away on its own.  If it does not, and rises above 101.0 please call the doctor.  For concerns about your baby, please call your pediatrician For breastfeeding concerns, the lactation consultant can be reached at [phone]

## 2017-01-12 NOTE — Progress Notes (Signed)
Pt resting at rounds/report, baby in bassinet.

## 2017-01-12 NOTE — Progress Notes (Signed)
Pt discharged to home with husband via W/C.

## 2018-05-16 ENCOUNTER — Other Ambulatory Visit: Payer: Self-pay

## 2018-05-16 ENCOUNTER — Emergency Department (HOSPITAL_COMMUNITY)
Admission: EM | Admit: 2018-05-16 | Discharge: 2018-05-16 | Disposition: A | Discharge status: Home / Self Care | Payer: Medicaid Other | Attending: Emergency Medicine | Admitting: Emergency Medicine

## 2018-05-16 DIAGNOSIS — F329 Major depressive disorder, single episode, unspecified: Secondary | ICD-10-CM | POA: Diagnosis not present

## 2018-05-16 DIAGNOSIS — Z9119 Patient's noncompliance with other medical treatment and regimen: Secondary | ICD-10-CM | POA: Insufficient documentation

## 2018-05-16 DIAGNOSIS — Z008 Encounter for other general examination: Secondary | ICD-10-CM | POA: Diagnosis present

## 2018-05-16 DIAGNOSIS — Z87891 Personal history of nicotine dependence: Secondary | ICD-10-CM | POA: Diagnosis not present

## 2018-05-16 DIAGNOSIS — F32A Depression, unspecified: Secondary | ICD-10-CM

## 2018-05-16 DIAGNOSIS — R45851 Suicidal ideations: Secondary | ICD-10-CM | POA: Diagnosis not present

## 2018-05-16 DIAGNOSIS — Z79899 Other long term (current) drug therapy: Secondary | ICD-10-CM | POA: Diagnosis not present

## 2018-05-16 DIAGNOSIS — F419 Anxiety disorder, unspecified: Secondary | ICD-10-CM | POA: Insufficient documentation

## 2018-05-16 LAB — CBC
HCT: 40.3 % (ref 36.0–46.0)
Hemoglobin: 11.6 g/dL — ABNORMAL LOW (ref 12.0–15.0)
MCH: 26.4 pg (ref 26.0–34.0)
MCHC: 28.8 g/dL — AB (ref 30.0–36.0)
MCV: 91.6 fL (ref 80.0–100.0)
PLATELETS: 313 10*3/uL (ref 150–400)
RBC: 4.4 MIL/uL (ref 3.87–5.11)
RDW: 14 % (ref 11.5–15.5)
WBC: 7.4 10*3/uL (ref 4.0–10.5)
nRBC: 0 % (ref 0.0–0.2)

## 2018-05-16 LAB — COMPREHENSIVE METABOLIC PANEL
ALK PHOS: 77 U/L (ref 38–126)
ALT: 12 U/L (ref 0–44)
ANION GAP: 10 (ref 5–15)
AST: 15 U/L (ref 15–41)
Albumin: 4.1 g/dL (ref 3.5–5.0)
BILIRUBIN TOTAL: 0.3 mg/dL (ref 0.3–1.2)
BUN: 6 mg/dL (ref 6–20)
CO2: 22 mmol/L (ref 22–32)
CREATININE: 0.67 mg/dL (ref 0.44–1.00)
Calcium: 8.9 mg/dL (ref 8.9–10.3)
Chloride: 108 mmol/L (ref 98–111)
Glucose, Bld: 96 mg/dL (ref 70–99)
Potassium: 3.7 mmol/L (ref 3.5–5.1)
Sodium: 140 mmol/L (ref 135–145)
TOTAL PROTEIN: 7.7 g/dL (ref 6.5–8.1)

## 2018-05-16 LAB — SALICYLATE LEVEL

## 2018-05-16 LAB — I-STAT BETA HCG BLOOD, ED (MC, WL, AP ONLY)

## 2018-05-16 LAB — ACETAMINOPHEN LEVEL

## 2018-05-16 LAB — RAPID URINE DRUG SCREEN, HOSP PERFORMED
AMPHETAMINES: NOT DETECTED
Barbiturates: NOT DETECTED
Benzodiazepines: NOT DETECTED
Cocaine: NOT DETECTED
OPIATES: NOT DETECTED
TETRAHYDROCANNABINOL: POSITIVE — AB

## 2018-05-16 LAB — ETHANOL: Alcohol, Ethyl (B): <10 mg/dL (ref <10)

## 2018-05-16 MED ORDER — LORAZEPAM 1 MG PO TABS
1.0000 mg | ORAL_TABLET | Freq: Once | ORAL | Status: AC
  Administered 2018-05-16: 1 mg via ORAL
  Filled 2018-05-16: qty 1

## 2018-05-16 MED ORDER — LEVETIRACETAM 250 MG PO TABS
750.0000 mg | ORAL_TABLET | Freq: Two times a day (BID) | ORAL | Status: DC
  Administered 2018-05-16: 750 mg via ORAL
  Filled 2018-05-16: qty 1

## 2018-05-16 NOTE — ED Provider Notes (Signed)
[facility]-EMERGENCY DEPT Provider Note   CSN: [id number] Arrival date & time: 05/16/18  2148    History   Chief Complaint Chief Complaint  Patient presents with  . Anxiety  . Depression    HPI Natalie Lawrence is a 28 y.o. female.     28 year old female with a history of seizures presents to the emergency department for worsening depression and anxiety.  States that she has been battling worsening depression since the birth of her son in November 2018.  She has been having intermittent suicidal thoughts since this time, but notes increased SI lately.  She feels that her boyfriend is not supportive or helpful in taking care of her son.  Has had thoughts of cutting her wrists as well as sticking a pair of scissors in her vagina.  She feels as though her family would be "better off" if she wasn't around.  Is currently homeless with increased financial stress; has been staying with a family members of her boyfriend.  No HI, ETOH use, reported drug use.  Patient with hx of epilepsy. Has been off of her medications for seizures since approximately December. Was transitioning from 750mg  Keppra BID to 150mg  Lamictal BID over a period of 9 weeks, but never fully transitioned before discontinuing these medications all together. Notes last seizure to be in January 2020.   Anxiety   Depression     Past Medical History:  Diagnosis Date  . Seizures [facility])     Patient Active Problem List   Diagnosis Date Noted  . Uterine contractions during pregnancy 01/10/2017  . NST (non-stress test) reactive 12/28/2016    Past Surgical History:  Procedure Laterality Date  . CESAREAN SECTION  2012     OB History    Gravida  3   Para  2   Term  2   Preterm      AB      Living  3     SAB      TAB      Ectopic      Multiple  1   Live Births  3            Home Medications    Prior to Admission medications   Medication Sig Start Date End Date  Taking? Authorizing Provider  magnesium gluconate (MAGONATE) 500 MG tablet Take 500 mg by mouth daily.   Yes [provider]  Multiple Vitamin (MULTIVITAMIN WITH MINERALS) TABS tablet Take 1 tablet by mouth daily.   Yes [provider]    Family History Family History  Problem Relation Age of Onset  . Cancer Paternal Grandmother   . Hypertension Paternal Grandmother   . Diabetes Paternal Grandmother     Social History Social History   Tobacco Use  . Smoking status: Former Games developer  . Smokeless tobacco: Never Used  Substance Use Topics  . Alcohol use: No  . Drug use: No     Allergies   Patient has no known allergies.   Review of Systems Review of Systems  Psychiatric/Behavioral: Positive for depression.  Ten systems reviewed and are negative for acute change, except as noted in the HPI.     Physical Exam Updated Vital Signs BP (!) 138/103 (BP Location: Right Arm)   Pulse 94   Temp 98.7 F (37.1 C) (Oral)   Resp 20   Ht 5\' 6"  (1.676 m)   Wt 104.3 kg   LMP 05/12/2018   SpO2 98%  BMI 37.12 kg/m   Physical Exam Vitals signs and nursing note reviewed.  Constitutional:      General: She is not in acute distress.    Appearance: She is well-developed. She is not diaphoretic.     Comments: Nontoxic appearing and in NAD  HENT:     Head: Normocephalic and atraumatic.  Eyes:     General: No scleral icterus.    Conjunctiva/sclera: Conjunctivae normal.  Neck:     Musculoskeletal: Normal range of motion.  Pulmonary:     Effort: Pulmonary effort is normal. No respiratory distress.     Comments: Respirations even and unlabored Musculoskeletal: Normal range of motion.  Skin:    General: Skin is warm and dry.     Coloration: Skin is not pale.     Findings: No erythema or rash.  Neurological:     General: No focal deficit present.     Mental Status: She is alert and oriented to person, place, and time.     Coordination: Coordination normal.   Psychiatric:        Mood and Affect: Mood is anxious and depressed.        Behavior: Behavior normal. Behavior is cooperative.        Thought Content: Thought content includes suicidal ideation. Thought content does not include homicidal ideation.      ED Treatments / Results  Labs (all labs ordered are listed, but only abnormal results are displayed) Labs Reviewed  ACETAMINOPHEN LEVEL - Abnormal; Notable for the following components:      Result Value   Acetaminophen (Tylenol), Serum <10 (*)    All other components within normal limits  CBC - Abnormal; Notable for the following components:   Hemoglobin 11.6 (*)    MCHC 28.8 (*)    All other components within normal limits  RAPID URINE DRUG SCREEN, HOSP PERFORMED - Abnormal; Notable for the following components:   Tetrahydrocannabinol POSITIVE (*)    All other components within normal limits  COMPREHENSIVE METABOLIC PANEL  ETHANOL  SALICYLATE LEVEL  I-STAT BETA HCG BLOOD, ED ([facility], [facility], [facility] ONLY)    EKG None  Radiology No results found.  Procedures Procedures (including critical care time)  Medications Ordered in ED Medications  levETIRAcetam (KEPPRA) tablet 750 mg (750 mg Oral Given 05/16/18 2302)  LORazepam (ATIVAN) tablet 1 mg (1 mg Oral Given 05/16/18 2303)     Initial Impression / Assessment and Plan / ED Course  I have reviewed the triage vital signs and the nursing notes.  Pertinent labs & imaging results that were available during my care of the patient were reviewed by me and considered in my medical decision making (see chart for details).        10:51 PM Case discussed with Dr. Wilford Corner of neurology.  He recommends that the patient be put back on Keppra until she is able to be tapered back on Lamictal.  She has not had any seizure activity since arrival in the ED.  11:28 PM Patient appears anxious and panicked. She states that she cannot stay because her child is unable to be consoled by friends and she  needs to be with him to care for him. She is demanding to get her items and be discharged from the department. States that she will follow up with her outpatient mental health providers; has not been able to see them since January. Will have MD Pickering also assess to determine whether safe for discharge.  11:31 PM Patient seen  by my attending, Dr. Rubin PayorPickering who believes patient is stable for discharge.   Final Clinical Impressions(s) / ED Diagnoses   Final diagnoses:  Depression, unspecified depression type    ED Discharge Orders    None       Antony MaduraHumes, Leeza Heiner, PA-C 05/16/18 2332    Benjiman CorePickering, Nathan, MD 05/16/18 [id number]

## 2018-05-16 NOTE — BHH Counselor (Addendum)
Clinician attempted to assess the pt. Pt continued to say she wanted to leave and be with her baby. Clinician discussed the three possible dispositions (discharge with OPT resources, observe/re-evaluate and inpatient treatment) in detail. Clinician expressed to the pt that if inpatient treatment is recommended and she declined to sign-in voluntary, she could be involuntarily committed. Clinician followed up with Melina Schools, RN. PA to talk to pt, Robyn, RN to follow up with TTS, to see if assessment is needed.    Redmond Pulling, MS, [facility], [facility]. Triage Specialist [phone]

## 2018-05-16 NOTE — BHH Counselor (Signed)
EDP/PA discontinued TTS order. Pt to be discharged.    Redmond Pulling, MS, [facility], [facility] Triage Specialist [phone]

## 2018-05-16 NOTE — ED Notes (Signed)
Pt alert and oriented. Pt denies any pain or discomfort. Pt denies any si, hi, or avh. Pt tearful, and reports being homeless. Sitter noted at the bedside. Pt safe will continue to monitor.

## 2018-05-16 NOTE — ED Notes (Signed)
Bed: WTR6 Expected date:  Expected time:  Means of arrival:  Comments: 

## 2018-05-16 NOTE — ED Notes (Signed)
Pt discharged home. Pt denies any si or hi. Pt reported her fiance is on the way to pick her up. Pt calm and cooperative. Pt verbalizes an understanding of discharge instructions.
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
## 2019-02-05 LAB — OB RESULTS CONSOLE GC/CHLAMYDIA
Chlamydia: NEGATIVE
Gonorrhea: NEGATIVE

## 2019-02-11 LAB — CYTOLOGY - PAP: Pap: NEGATIVE

## 2019-02-18 DIAGNOSIS — O99211 Obesity complicating pregnancy, first trimester: Secondary | ICD-10-CM | POA: Insufficient documentation

## 2019-03-06 NOTE — L&D Delivery Note (Signed)
OB/GYN Faculty Practice Delivery Note  Natalie Lawrence is a 29 y.o. [id number] s/p NSVD at [redacted]w[redacted]d. She was admitted for latent labor.   ROM: 4h 81m with clear fluid GBS Status: positive Maximum Maternal Temperature: 98.5*F  Labor Progress: Labor was augmented with pitocin. AROM was performed, she progressed to complete and delivered shortly after.  Delivery Date/Time: 07/03/19, 0236 Delivery: Called to room and patient was complete and pushing. Head delivered ROA. No nuchal cord present. Shoulder and body delivered in usual fashion. Infant with spontaneous cry, placed on mother's abdomen, dried and stimulated. Cord clamped x 2 after 1-minute delay, and cut by FOB under my direct supervision. Cord blood drawn. Placenta delivered spontaneously with gentle cord traction. Fundus firm with massage and Pitocin. Labia, perineum, vagina, and cervix were inspected, and a small right vaginal side wall laceration was identified, bleeding- repaired with 3-0 Monocryl in the usual fashion.   Placenta: 3 vessel cord, intact, to L&D Complications: None Lacerations: a small right vaginal side wall laceration was identified, bleeding- repaired with 3-0 Monocryl in the usual fashion EBL: 400 mL Analgesia: epidural  Postpartum Planning [x]  message to sent to schedule follow-up  [x]  vaccines UTD  Infant: female  APGARs 8,9  weight per medical record  , DO OB/GYN Fellow, Faculty Practice

## 2019-05-05 ENCOUNTER — Inpatient Hospital Stay (HOSPITAL_COMMUNITY)
Admission: AD | Admit: 2019-05-05 | Discharge: 2019-05-05 | Disposition: A | Discharge status: Home / Self Care | Payer: Medicaid Other | Attending: Obstetrics & Gynecology | Admitting: Obstetrics & Gynecology

## 2019-05-05 ENCOUNTER — Encounter (HOSPITAL_COMMUNITY): Payer: Self-pay | Admitting: *Deleted

## 2019-05-05 ENCOUNTER — Other Ambulatory Visit: Payer: Self-pay

## 2019-05-05 DIAGNOSIS — O26893 Other specified pregnancy related conditions, third trimester: Secondary | ICD-10-CM | POA: Diagnosis not present

## 2019-05-05 DIAGNOSIS — O99353 Diseases of the nervous system complicating pregnancy, third trimester: Secondary | ICD-10-CM | POA: Diagnosis not present

## 2019-05-05 DIAGNOSIS — G40909 Epilepsy, unspecified, not intractable, without status epilepticus: Secondary | ICD-10-CM | POA: Diagnosis not present

## 2019-05-05 DIAGNOSIS — N949 Unspecified condition associated with female genital organs and menstrual cycle: Secondary | ICD-10-CM | POA: Diagnosis present

## 2019-05-05 DIAGNOSIS — Z87891 Personal history of nicotine dependence: Secondary | ICD-10-CM | POA: Diagnosis not present

## 2019-05-05 DIAGNOSIS — Z3A30 30 weeks gestation of pregnancy: Secondary | ICD-10-CM

## 2019-05-05 DIAGNOSIS — R102 Pelvic and perineal pain: Secondary | ICD-10-CM | POA: Diagnosis present

## 2019-05-05 LAB — WET PREP, GENITAL
Clue Cells Wet Prep HPF POC: NONE SEEN
Sperm: NONE SEEN
Trich, Wet Prep: NONE SEEN
Yeast Wet Prep HPF POC: NONE SEEN

## 2019-05-05 MED ORDER — COMFORT FIT MATERNITY SUPP SM MISC: 1.0000 [IU] | Freq: Every day | 0 refills | Status: DC | PRN

## 2019-05-05 NOTE — MAU Provider Note (Signed)
History     CSN: [id number]  Arrival date and time: 05/05/19 1427   First Provider Initiated Contact with Patient 05/05/19 1506      Chief Complaint  Patient presents with  . Pelvic Pain    pressure constant    HPI  Ms.  Natalie Lawrence is a 29 y.o. year old G28P2003 female at [redacted]w[redacted]d weeks gestation who arrives to MAU via EMS reporting constant pelvic pressure that started about 30 minutes prior to her arrival. She reports that she was in the park with her son when the pressure started all of a sudden, but it was more like pain than pressure. She had to sit down and call EMS. She said as she approached the hospital on the EMS truck, the pain subsided but the pressure remained. She also complains of "feeling wet." She denies VB. She reports good (+) FM. She [facility] at the [facility] in [place], moved to [place] and now is back in [place]. She wants to establish care at an OB/GYN in [place], because she doesn't want to not be able to get to hospital in [place]. She has a h/o epilepsy since 2015. She stopped taking her Keppra "d/t pregnancy." She does not have a neurologist in [place]. Her last seizure was Saturday 05/02/2019. She reports she "only has them in her sleep." She knows this because she "wake up with bloody pillow and tongue bent." She states she "has some seizure-like feelings sometimes while awake, but has never had a seizure while awake." She reports she is trying to get her Medicaid established, but not as of yet.    Past Medical History:  Diagnosis Date  . Seizures (HCC)     Past Surgical History:  Procedure Laterality Date  . CESAREAN SECTION  2012    Family History  Problem Relation Age of Onset  . Cancer Paternal Grandmother   . Hypertension Paternal Grandmother   . Diabetes Paternal Grandmother     Social History   Tobacco Use  . Smoking status: Former Games developer  . Smokeless tobacco: Never Used  Substance Use Topics  . Alcohol use:  No  . Drug use: No    Allergies: No Known Allergies  Medications Prior to Admission  Medication Sig Dispense Refill Last Dose  . magnesium gluconate (MAGONATE) 500 MG tablet Take 500 mg by mouth daily.     . Multiple Vitamin (MULTIVITAMIN WITH MINERALS) TABS tablet Take 1 tablet by mouth daily.       Review of Systems  Constitutional: Negative.   HENT: Negative.   Eyes: Negative.   Respiratory: Negative.   Cardiovascular: Negative.   Gastrointestinal: Negative.   Endocrine: Negative.   Genitourinary: Positive for pelvic pain ("pressure") and vaginal discharge ("feels wet").  Musculoskeletal: Negative.   Skin: Negative.   Allergic/Immunologic: Negative.   Neurological: Negative.   Hematological: Negative.   Psychiatric/Behavioral: Negative.    Physical Exam   Blood pressure 98/64, pulse 81, temperature 98.5 F (36.9 C), temperature source Oral, resp. rate 19, height 5\' 6"  (1.676 m), weight 104.3 kg, last menstrual period 05/12/2018, SpO2 99 %.  Physical Exam  Nursing note and vitals reviewed. Constitutional: She is oriented to person, place, and time. She appears well-developed and well-nourished.  HENT:  Head: Normocephalic and atraumatic.  Eyes: Pupils are equal, round, and reactive to light.  Cardiovascular: Normal rate.  Respiratory: Effort normal.  GI: Soft.  Genitourinary:    Genitourinary Comments: Uterus: gravid, SE: cervix is smooth, pink, no lesions,  moderate amt of thin, white vaginal d/c -- WP, GC/CT done, closed/long/soft/posterior, mild CMT or friability, no adnexal tenderness     Musculoskeletal:        General: Normal range of motion.     Cervical back: Normal range of motion.  Neurological: She is alert and oriented to person, place, and time.  Skin: Skin is warm and dry.  Psychiatric: She has a normal mood and affect. Her behavior is normal. Judgment and thought content normal.   NST - FHR: 140 bpm / moderate variability / accels present / decels  absent / TOCO: UI noted  MAU Course  Procedures  MDM Wet Prep GC/CT -- Results pending   Results for orders placed or performed during the hospital encounter of 05/05/19 (from the past 24 hour(s))  Wet prep, genital     Status: Abnormal   Collection Time: 05/05/19  3:16 PM   Specimen: PATH Cytology Cervicovaginal Ancillary Only  Result Value Ref Range   Yeast Wet Prep HPF POC NONE SEEN NONE SEEN   Trich, Wet Prep NONE SEEN NONE SEEN   Clue Cells Wet Prep HPF POC NONE SEEN NONE SEEN   WBC, Wet Prep HPF POC MANY (A) NONE SEEN   Sperm NONE SEEN     Assessment and Plan  Round ligament pain - Plan: Discharge patient - Rx for maternity support belt given, also info for purchasing one on [employer] - Information provided on RLP - Get scheduled for [facility] - OB providers list given - Patient verbalized an understanding of the plan of care and agrees.    Laury Deep, MSN, CNM 05/05/2019, 3:06 PM

## 2019-05-05 NOTE — Discharge Instructions (Signed)
  PREGNANCY SUPPORT BELT: You are not alone, Seventy-five percent of women have some sort of abdominal or back pain at some point in their pregnancy. Your baby is growing at a fast pace, which means that your whole body is rapidly trying to adjust to the changes. As your uterus grows, your back may start feeling a bit under stress and this can result in back or abdominal pain that can go from mild, and therefore bearable, to severe pains that will not allow you to sit or lay down comfortably, When it comes to dealing with pregnancy-related pains and cramps, some pregnant women usually prefer natural remedies, which the market is filled with nowadays. For example, wearing a pregnancy support belt can help ease and lessen your discomfort and pain. WHAT ARE THE BENEFITS OF WEARING A PREGNANCY SUPPORT BELT? A pregnancy support belt provides support to the lower portion of the belly taking some of the weight of the growing uterus and distributing to the other parts of your body. It is designed make you comfortable and gives you extra support. Over the years, the pregnancy apparel market has been studying the needs and wants of pregnant women and they have come up with the most comfortable pregnancy support belts that woman could ever ask for. In fact, you will no longer have to wear a stretched-out or bulky pregnancy belt that is visible underneath your clothes and makes you feel even more uncomfortable. Nowadays, a pregnancy support belt is made of comfortable and stretchy materials that will not irritate your skin but will actually make you feel at ease and you will not even notice you are wearing it. They are easy to put on and adjust during the day and can be worn at night for additional support.  BENEFITS: . Relives Back pain . Relieves Abdominal Muscle and Leg Pain . Stabilizes the Pelvic Ring . Offers a Cushioned Abdominal Lift Pad . Relieves pressure on the Sciatic Nerve Within Minutes WHERE TO GET YOUR  PREGNANCY BELT:  1) You can purchase a 4 in 1 Pregnancy support belt maternity pelvic girdle & back pain $19.97 up to $28.95 on [employer].  2) Conservation officer, nature and [employer]  [address] [place], [place] [address]  [phone]  [email or web address]    Center for [facility] Healthcare Prenatal Care Providers          Center for [facility] locations:  Hours may vary. Please call for an appointment  [facility] for [facility] @ [place]  [address] [place]  [phone]  [facility] for [facility] @ [facility]   [address]  [phone]  Center For [facility] @ [facility]       [address] [phone]            Center for [facility] Healthcare @ [place]     [phone]          Center for [facility] @ [facility]   [address] [phone]  Center for [facility] [place] [facility] @ [facility]  [address] [phone]     Center for [facility] @ [facility]  [address]   [phone]

## 2019-05-05 NOTE — MAU Note (Signed)
Pt received to 128 via EMS with c/o constant pelvic pressure starting approximately 30 minutes ago, pt "feels a little wet" but normal vaginal discharge noted on panties, no vaginal bleeding or obvious LOF noted. Pt verbalized + Fm, EFM applied and assessing.

## 2019-05-06 LAB — GC/CHLAMYDIA PROBE AMP (~~LOC~~) NOT AT ARMC
Chlamydia: NEGATIVE
Comment: NEGATIVE
Comment: NORMAL
Neisseria Gonorrhea: NEGATIVE

## 2019-05-14 ENCOUNTER — Telehealth: Payer: Self-pay | Admitting: Family Medicine

## 2019-05-14 NOTE — Telephone Encounter (Signed)
Attempted to contact patient to get her scheduled for an appointment to start prenatal care at our office. No answer, left voicemail for patient to give the office a call back to be scheduled.

## 2019-05-17 ENCOUNTER — Encounter (HOSPITAL_COMMUNITY): Payer: Self-pay | Admitting: Emergency Medicine

## 2019-05-17 ENCOUNTER — Emergency Department (HOSPITAL_COMMUNITY)
Admission: EM | Admit: 2019-05-17 | Discharge: 2019-05-17 | Disposition: A | Discharge status: Home / Self Care | Payer: Medicaid Other | Attending: Emergency Medicine | Admitting: Emergency Medicine

## 2019-05-17 ENCOUNTER — Emergency Department (HOSPITAL_COMMUNITY): Payer: Medicaid Other

## 2019-05-17 DIAGNOSIS — Z79899 Other long term (current) drug therapy: Secondary | ICD-10-CM | POA: Diagnosis not present

## 2019-05-17 DIAGNOSIS — Z87891 Personal history of nicotine dependence: Secondary | ICD-10-CM | POA: Insufficient documentation

## 2019-05-17 DIAGNOSIS — R0789 Other chest pain: Secondary | ICD-10-CM

## 2019-05-17 DIAGNOSIS — O99353 Diseases of the nervous system complicating pregnancy, third trimester: Secondary | ICD-10-CM | POA: Insufficient documentation

## 2019-05-17 DIAGNOSIS — R21 Rash and other nonspecific skin eruption: Secondary | ICD-10-CM | POA: Insufficient documentation

## 2019-05-17 DIAGNOSIS — Z3A33 33 weeks gestation of pregnancy: Secondary | ICD-10-CM | POA: Insufficient documentation

## 2019-05-17 DIAGNOSIS — R569 Unspecified convulsions: Secondary | ICD-10-CM | POA: Diagnosis not present

## 2019-05-17 LAB — URINALYSIS, ROUTINE W REFLEX MICROSCOPIC
Bilirubin Urine: NEGATIVE
Glucose, UA: NEGATIVE mg/dL
Ketones, ur: 80 mg/dL — AB
Leukocytes,Ua: NEGATIVE
Nitrite: NEGATIVE
Protein, ur: NEGATIVE mg/dL
RBC / HPF: >50 RBC/hpf — ABNORMAL HIGH (ref 0–5)
Specific Gravity, Urine: 1.023 (ref 1.005–1.030)
pH: 5 (ref 5.0–8.0)

## 2019-05-17 LAB — CBC
HCT: 36.6 % (ref 36.0–46.0)
Hemoglobin: 11.5 g/dL — ABNORMAL LOW (ref 12.0–15.0)
MCH: 28.7 pg (ref 26.0–34.0)
MCHC: 31.4 g/dL (ref 30.0–36.0)
MCV: 91.3 fL (ref 80.0–100.0)
Platelets: 208 10*3/uL (ref 150–400)
RBC: 4.01 MIL/uL (ref 3.87–5.11)
RDW: 13 % (ref 11.5–15.5)
WBC: 15.4 10*3/uL — ABNORMAL HIGH (ref 4.0–10.5)
nRBC: 0 % (ref 0.0–0.2)

## 2019-05-17 LAB — BASIC METABOLIC PANEL
Anion gap: 9 (ref 5–15)
BUN: 6 mg/dL (ref 6–20)
CO2: 18 mmol/L — ABNORMAL LOW (ref 22–32)
Calcium: 8.3 mg/dL — ABNORMAL LOW (ref 8.9–10.3)
Chloride: 108 mmol/L (ref 98–111)
Creatinine, Ser: 0.48 mg/dL (ref 0.44–1.00)
GFR calc Af Amer: >60 mL/min (ref >60)
GFR calc non Af Amer: >60 mL/min (ref >60)
Glucose, Bld: 86 mg/dL (ref 70–99)
Potassium: 3.3 mmol/L — ABNORMAL LOW (ref 3.5–5.1)
Sodium: 135 mmol/L (ref 135–145)

## 2019-05-17 MED ORDER — NITROFURANTOIN MONOHYD MACRO 100 MG PO CAPS: 100.0000 mg | ORAL_CAPSULE | Freq: Two times a day (BID) | ORAL | 0 refills | Status: DC

## 2019-05-17 NOTE — Progress Notes (Signed)
Pt is a G4 P3 at 32 4/[redacted] weeks gestation presenting with chest pain that started around 0900 this morning. Pt denies diaphoresis, rapid or irreular heart rate, pain in her arms or jaw, no nausea. Says the pain does not radiate. Pt also has a hx of epilepsy that was diagnosed in 2015. She has recently moved here from [place] and says she was seeing a neurologist there. Says she has not been on any seizure meds since this pregnancy. Pt says she has about one seizure a month that occurs while she is sleeping. Says she wakes up and has bitten her tongue and has urinated on herself. She denies any problems with this pregnancy or her previous pregnancies. She has had a vaginal delivery, a C/S, and a vaginal delivery. The C/S was for a twin gestation. Says  She delivered all her babies at term. She denies vaginal bleeding or leaking of fluid. Denies cramping or uc's.

## 2019-05-17 NOTE — ED Triage Notes (Addendum)
Per pt, states she is 8 1/2 months pregnant-has not had any prenatal care-first appointment is on Tuesday-states CP that started this am, rash on face and neck-states she has been off her seizure meds since becoming pregnant-had a seizure last night and bit her tongue-states she has been pregnant with normal deliveries times 5

## 2019-05-17 NOTE — ED Notes (Signed)
Rapid OB called  

## 2019-05-17 NOTE — Progress Notes (Signed)
Spoke with Dr. Vergie Living. Pt is a G4P3 at 32 4/[redacted] weeks gestation presenting with chest pain. V/S are stable, 02 sats 99-100%. FHR tracing is a category 1 with no uc's tracing. No vaginal bleeding or leaking of fluid. She has an appointment with [facility] on 05/19/2019. Pt is OB cleared.

## 2019-05-17 NOTE — Discharge Instructions (Signed)
Follow up with your OB GYN doctor, consider seeing a neurologist considering the recurrent seizures

## 2019-05-17 NOTE — ED Provider Notes (Signed)
[place] DEPT Provider Note   CSN: [id number] Arrival date & time: 05/17/19  1145     History Chief Complaint  Patient presents with  . Chest Pain    Natalie Lawrence is a 29 y.o. female.  HPI   Patient presented to the ED for evaluation after a seizure this morning.  Patient is G4 P2-0-0-3.  She states she is at approximately 33 weeks.  Patient states she has a history of seizure disorder.  She used to be on Keppra but stopped taking it during her pregnancy.  Patient woke up this morning feeling that she had a seizure.  She bit her tongue.  She also noticed that she developed a rash on her face and was having some chest discomfort.  She came to the ER for evaluation.  Patient denies any fevers or chills.  No cough or shortness of breath.  Previous records indicate the patient has had seizures associated with her pregnancy.  She was evaluated at the maternity admission unit at [facility] on March 2 at that time she indicated her last seizure was February 27. Past Medical History:  Diagnosis Date  . Seizures [facility])     Patient Active Problem List   Diagnosis Date Noted  . Round ligament pain 05/05/2019  . Uterine contractions during pregnancy 01/10/2017  . NST (non-stress test) reactive 12/28/2016    Past Surgical History:  Procedure Laterality Date  . CESAREAN SECTION  2012     OB History    Gravida  4   Para  2   Term  2   Preterm      AB      Living  3     SAB      TAB      Ectopic      Multiple  1   Live Births  3           Family History  Problem Relation Age of Onset  . Cancer Paternal Grandmother   . Hypertension Paternal Grandmother   . Diabetes Paternal Grandmother     Social History   Tobacco Use  . Smoking status: Former Research scientist (life sciences)  . Smokeless tobacco: Never Used  Substance Use Topics  . Alcohol use: No  . Drug use: No    Home Medications Prior to Admission medications   Medication Sig  Start Date End Date Taking? Authorizing Provider  Prenatal Vit-Iron Carbonyl-FA (PRENATABS RX) 29-1 MG TABS Take 1 tablet by mouth daily.   Yes [provider]  Elastic Bandages & Supports (COMFORT FIT MATERNITY SUPP SM) MISC 1 Units by Does not apply route daily as needed. 05/05/19   Laury Deep, CNM    Allergies    Patient has no known allergies.  Review of Systems   Review of Systems  Constitutional: Negative for fever.  Respiratory: Negative for shortness of breath.   Cardiovascular: Positive for chest pain.  Gastrointestinal: Negative for abdominal pain.  Genitourinary: Negative for dysuria and vaginal bleeding.  Musculoskeletal: Negative for joint swelling.  All other systems reviewed and are negative.   Physical Exam Updated Vital Signs BP 102/88   Pulse 92   Temp 98.3 F (36.8 C) (Oral)   Resp 13   LMP 05/12/2018   SpO2 100%   Physical Exam Vitals and nursing note reviewed.  Constitutional:      General: She is not in acute distress.    Appearance: She is well-developed.  HENT:  Head: Normocephalic.     Comments: Small injury to the tongue noted    Right Ear: External ear normal.     Left Ear: External ear normal.  Eyes:     General: No scleral icterus.       Right eye: No discharge.        Left eye: No discharge.     Conjunctiva/sclera: Conjunctivae normal.  Neck:     Trachea: No tracheal deviation.  Cardiovascular:     Rate and Rhythm: Normal rate and regular rhythm.     Comments: Rate in the 90s at the bedside Pulmonary:     Effort: Pulmonary effort is normal. No respiratory distress.     Breath sounds: Normal breath sounds. No stridor. No wheezing or rales.     Comments: Tenderness palpation anterior chest wall Abdominal:     General: Bowel sounds are normal. There is no distension.     Palpations: Abdomen is soft.     Tenderness: There is no abdominal tenderness. There is no guarding or rebound.  Musculoskeletal:        General: No  tenderness.     Cervical back: Neck supple.  Skin:    General: Skin is warm and dry.     Findings: Rash present.     Comments: Fine petechial rash involving the neck and face  Neurological:     Mental Status: She is alert.     Cranial Nerves: No cranial nerve deficit (no facial droop, extraocular movements intact, no slurred speech).     Sensory: No sensory deficit.     Motor: No abnormal muscle tone or seizure activity.     Coordination: Coordination normal.     ED Results / Procedures / Treatments   Labs (all labs ordered are listed, but only abnormal results are displayed) Labs Reviewed  BASIC METABOLIC PANEL - Abnormal; Notable for the following components:      Result Value   Potassium 3.3 (*)    CO2 18 (*)    Calcium 8.3 (*)    All other components within normal limits  CBC - Abnormal; Notable for the following components:   WBC 15.4 (*)    Hemoglobin 11.5 (*)    All other components within normal limits  URINALYSIS, ROUTINE W REFLEX MICROSCOPIC    EKG EKG Interpretation  Date/Time:  Sunday May 17 2019 12:07:07 EDT Ventricular Rate:  98 PR Interval:    QRS Duration: 86 QT Interval:  339 QTC Calculation: 433 R Axis:   44 Text Interpretation: Sinus rhythm Consider right atrial enlargement No significant change since last tracing Confirmed by Linwood Dibbles [phone]) on 05/17/2019 12:19:12 PM   Radiology DG Chest Portable 1 View  Result Date: 05/17/2019 CLINICAL DATA:  Chest pain EXAM: PORTABLE CHEST 1 VIEW COMPARISON:  03/21/2016 chest radiograph. FINDINGS: Stable cardiomediastinal silhouette with normal heart size. No pneumothorax. No pleural effusion. Lungs appear clear, with no acute consolidative airspace disease and no pulmonary edema. IMPRESSION: No active disease. Electronically Signed   By: Delbert Phenix M.D.   On: 05/17/2019 12:42    Procedures Procedures (including critical care time)  Medications Ordered in ED Medications - No data to display  ED  Course  I have reviewed the triage vital signs and the nursing notes.  Pertinent labs & imaging results that were available during my care of the patient were reviewed by me and considered in my medical decision making (see chart for details).  Clinical Course as of May 17 9415  Sun May 17, 2019  1623 Patient was monitored in the ED.  No recurrent seizures.  Plan on getting sample patient specimen.  She is not having any urinary discomfort.  I have low suspicion for UTI.  She appears stable for discharge.   [JK]    Clinical Course User Index [JK] Linwood Dibbles, MD   MDM Rules/Calculators/A&P                      Patient presented to the ED for evaluation seizures and chest pain.  Patient has been monitored serially.  Patient has not taken her seizure medications because she was concerned complications associated with pregnancy.  She is remained stable condition follow-up with neurology as discussed treatment options.  Her chest pain, I suspect this is musculoskeletal in nature.  Does have chest wall tenderness.  EKG and chest x-ray showing.  Rash on face is consistent with small vessel injury likely from the seizure.  No sign of infection Final Clinical Impression(s) / ED Diagnoses Final diagnoses:  Seizure (HCC)  Chest wall pain    Rx / DC Orders ED Discharge Orders         Ordered    Ambulatory referral to Neurology    Comments: An appointment is requested in approximately: 1 week Hx of seizures, pregnant, pt decided to stop seizure medications   05/17/19 1623           Linwood Dibbles, MD 05/17/19 1627

## 2019-05-19 ENCOUNTER — Encounter: Payer: Self-pay | Admitting: Women's Health

## 2019-05-19 ENCOUNTER — Ambulatory Visit (INDEPENDENT_AMBULATORY_CARE_PROVIDER_SITE_OTHER): Payer: Medicaid Other | Admitting: Women's Health

## 2019-05-19 ENCOUNTER — Other Ambulatory Visit: Payer: Self-pay

## 2019-05-19 VITALS — BP 108/70 | HR 71 | Wt 249.0 lb

## 2019-05-19 DIAGNOSIS — O99353 Diseases of the nervous system complicating pregnancy, third trimester: Secondary | ICD-10-CM

## 2019-05-19 DIAGNOSIS — O0990 Supervision of high risk pregnancy, unspecified, unspecified trimester: Secondary | ICD-10-CM | POA: Insufficient documentation

## 2019-05-19 DIAGNOSIS — R569 Unspecified convulsions: Secondary | ICD-10-CM

## 2019-05-19 DIAGNOSIS — T7491XA Unspecified adult maltreatment, confirmed, initial encounter: Secondary | ICD-10-CM | POA: Insufficient documentation

## 2019-05-19 DIAGNOSIS — Z3A32 32 weeks gestation of pregnancy: Secondary | ICD-10-CM

## 2019-05-19 DIAGNOSIS — O34219 Maternal care for unspecified type scar from previous cesarean delivery: Secondary | ICD-10-CM

## 2019-05-19 DIAGNOSIS — Z98891 History of uterine scar from previous surgery: Secondary | ICD-10-CM | POA: Insufficient documentation

## 2019-05-19 DIAGNOSIS — O99323 Drug use complicating pregnancy, third trimester: Secondary | ICD-10-CM

## 2019-05-19 DIAGNOSIS — F1290 Cannabis use, unspecified, uncomplicated: Secondary | ICD-10-CM | POA: Diagnosis not present

## 2019-05-19 MED ORDER — BLOOD PRESSURE KIT DEVI: 1.0000 | Freq: Every day | 0 refills | Status: DC

## 2019-05-19 MED ORDER — FOLIC ACID 1 MG PO TABS: 1.0000 mg | ORAL_TABLET | Freq: Every day | ORAL | 3 refills | Status: DC
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
## 2019-05-19 NOTE — Progress Notes (Signed)
VBAC and Tubal signed today

## 2019-05-19 NOTE — Patient Instructions (Addendum)
Maternity Assessment Unit (MAU)  The Maternity Assessment Unit (MAU) is located at the [facility] at [facility]. The address is: [address], [place], [place], [place] [address]. Please see map below for additional directions.    The Maternity Assessment Unit is designed to help you during your pregnancy, and for up to 6 weeks after delivery, with any pregnancy- or postpartum-related emergencies, if you think you are in labor, or if your water has broken. For example, if you experience nausea and vomiting, vaginal bleeding, severe abdominal or pelvic pain, elevated blood pressure or other problems related to your pregnancy or postpartum time, please come to the Maternity Assessment Unit for assistance.    Pregnancy and Influenza  Influenza, also called the flu, is an infection of the lungs and airways (respiratory tract). If you are pregnant, you are more likely to catch the flu. You are also more likely to have a more serious case of the flu. This is because pregnancy causes changes to your body's disease-fighting system (immune system), heart, and lungs. If you develop a bad case of the flu, especially with a high fever, this can cause problems for you and your developing baby. How do people get the flu? The flu is caused by a type of germ called a virus. It spreads when virus particles get passed from person to person by:  Being near a sick person who is coughing or sneezing.  Touching something that has the virus on it and then touching your mouth, nose, or face. The influenza virus is most common during the fall and winter. How can I protect myself against the flu?  Get a flu shot. The best way to prevent the flu is to get a flu shot before flu season starts. The flu shot is not dangerous for your developing baby. It may even help protect your baby from the flu for up to 6 months after birth.  Wash your hands often with soap and warm water. If soap  and water are not available, use hand sanitizer.  Do not come in close contact with sick people.  Do not share food, drinks, or utensils with other people.  Avoid touching your eyes, nose, and mouth.  Clean frequently used surfaces at home, school, or work.  Practice healthy lifestyle habits, such as: ? Eating a healthy, balanced diet. ? Drinking plenty of fluids. ? Exercising regularly or as told by your health care provider. ? Sleeping 7-9 hours each night. ? Finding ways to manage stress. What should I do if I have flu symptoms?  If you have any symptoms of the flu, even after getting a flu shot, contact your health care provider right away.  To reduce fever, take over-the-counter acetaminophen as told by your health care provider.  If you have the flu, you may get antiviral medicine to keep the flu from becoming severe and to shorten how long it lasts.  Avoid spreading the flu to others: ? Stay home until you are well. ? Cover your nose and mouth when you cough or sneeze. ? Wash your hands often. Follow these instructions at home:  Take over-the-counter and prescription medicines only as told by your health care provider. Do not take any medicine, including cold or flu medicine, unless your health care provider tells you to do so.  If you were prescribed antiviral medicine, take it as told by your health care provider. Do not stop taking the antiviral medicine even if you  start to feel better.  Eat a nutrient-rich diet that includes fresh fruits and vegetables, whole grains, lean protein, and low-fat dairy.  Drink enough fluid to keep your urine clear or pale yellow.  Get plenty of rest. Contact a health care provider if:  You have fever or chills.  You have a cough, sore throat, or stuffy nose.  You have worsening or unusual: ? Muscle aches. ? Headache. ? Tiredness. ? Loss of appetite.  You have vomiting or diarrhea. Get help right away if:  You have trouble  breathing.  You have chest pain.  You have abdominal pain.  You begin to have labor pains.  You have a fever that does not go down 24 hours after you take medicine.  You do not feel your baby move.  You have diarrhea or vomiting that will not go away.  You have dizziness or confusion.  Your symptoms do not improve, even with treatment. Summary  If you are pregnant, you are more likely to catch the flu. You are also more likely to have a more serious case of the flu.  If you have flu-like symptoms, call your health care provider right away. If you develop a bad case of the flu, especially with a high fever, this can be dangerous for your developing baby.  The best way to prevent the flu is to get a flu shot before flu season starts. The flu shot is not dangerous for your developing baby.  If you have the flu and were prescribed antiviral medicine, take it as told by your health care provider. This information is not intended to replace advice given to you by your health care provider. Make sure you discuss any questions you have with your health care provider. Document Revised: 06/13/2018 Document Reviewed: 04/17/2016 Elsevier Patient Education  2020 [employer].   . Vaginal Birth After Cesarean Delivery  Vaginal birth after cesarean delivery (VBAC) is giving birth vaginally after previously delivering a baby through a cesarean section (C-section). A VBAC may be a safe option for you, depending on your health and other factors. It is important to discuss VBAC with your health care provider early in your pregnancy so you can understand the risks, benefits, and options. Having these discussions early will give you time to make your birth plan. Who are the best candidates for VBAC? The best candidates for VBAC are women who: Have had one or two prior cesarean deliveries, and the incision made during the delivery was horizontal (low transverse). Do not have a vertical (classical)  scar on their uterus. Have not had a tear in the wall of their uterus (uterine rupture). Plan to have more pregnancies. A VBAC is also more likely to be successful: In women who have previously given birth vaginally. When labor starts by itself (spontaneously) before the due date. What are the benefits of VBAC? The benefits of delivering your baby vaginally instead of by a cesarean delivery include: A shorter hospital stay. A faster recovery time. Less pain. Avoiding risks associated with major surgery, such as infection and blood clots. Less blood loss and less need for donated blood (transfusions). What are the risks of VBAC? The main risk of attempting a VBAC is that it may fail, forcing your health care provider to deliver your baby by a C-section. Other risks are rare and include: Tearing (rupture) of the scar from a past cesarean delivery. Other risks associated with vaginal deliveries. If a repeat cesarean delivery is needed, the risks  include: Blood loss. Infection. Blood clot. Damage to surrounding organs. Removal of the uterus (hysterectomy), if it is damaged. Placenta problems in future pregnancies. What else should I know about my options? Delivering a baby through a VBAC is similar to having a normal spontaneous vaginal delivery. Therefore, it is safe: To try with twins. For your health care provider to try to turn the baby from a breech position (external cephalic version) during labor. With epidural analgesia for pain relief. Consider where you would like to deliver your baby. VBAC should be attempted in facilities where an emergency cesarean delivery can be performed. VBAC is not recommended for home births. Any changes in your health or your baby's health during your pregnancy may make it necessary to change your initial decision about VBAC. Your health care provider may recommend that you do not attempt a VBAC if: Your baby's suspected weight is 8.8 lb (4 kg) or  more. You have preeclampsia. This is a condition that causes high blood pressure along with other symptoms, such as swelling and headaches. You will have VBAC less than 19 months after your cesarean delivery. You are past your due date. You need to have labor started (induced) because your cervix is not ready for labor (unfavorable). Where to find more information American Pregnancy Association: americanpregnancy.org [employer] of Obstetricians and Gynecologists: acog.org Summary Vaginal birth after cesarean delivery (VBAC) is giving birth vaginally after previously delivering a baby through a cesarean section (C-section). A VBAC may be a safe option for you, depending on your health and other factors. Discuss VBAC with your health care provider early in your pregnancy so you can understand the risks, benefits, options, and have plenty of time to make your birth plan. The main risk of attempting a VBAC is that it may fail, forcing your health care provider to deliver your baby by a C-section. Other risks are rare. This information is not intended to replace advice given to you by your health care provider. Make sure you discuss any questions you have with your health care provider. Document Revised: 06/17/2018 Document Reviewed: 05/29/2016 Elsevier Patient Education  [place] During Pregnancy Genetic testing during pregnancy is also called prenatal genetic testing. This type of testing can determine if your baby is at risk of being born with a disorder caused by abnormal genes or chromosomes (genetic disorder). Chromosomes contain genes that control how your baby will develop in your womb. There are many different genetic disorders. Examples of genetic disorders that may be found through genetic testing include Down syndrome and cystic fibrosis. Gene changes (mutations) can be passed down through families. Genetic testing is offered to all women before or  during pregnancy. You can choose whether to have genetic testing. Why is genetic testing done? Genetic testing is done during pregnancy to find out whether your child is at risk for a genetic disorder. Having genetic testing allows you to:  Discuss your test results and options with a genetic counselor.  Prepare for a baby that may be born with a genetic disorder. Learning about the disorder ahead of time helps you be better prepared to manage it. Your health care providers can also be prepared in case your baby requires special care before or after birth.  Consider whether you want to continue with the pregnancy. In some cases, genetic testing may be done to learn about the traits a child will inherit. Types of genetic tests There are two basic types of  genetic testing. Screening tests indicate whether your developing baby (fetus) is at higher risk for a genetic disorder. Diagnostic tests check actual fetal cells to diagnose a genetic disorder. Screening tests     Screening tests will not harm your baby. They are recommended for all pregnant women. Types of screening tests include:  Carrier screening. This test involves checking genes from both parents by testing their blood or saliva. The test checks to find out if the parents carry a genetic mutation that may be passed to a baby. In most cases, both parents must carry the mutation for a baby to be at risk.  First trimester screening. This test combines a blood test with sound wave imaging of your baby (fetal ultrasound). This screening test checks for a risk of Down syndrome or other defects caused by having extra chromosomes. It also checks for defects of the heart, abdomen, or skeleton.  Second trimester screening also combines a blood test with a fetal ultrasound exam. It checks for a risk of genetic defects of the face, brain, spine, heart, or limbs.  Combined or sequential screening. This type of testing combines the results of first  and second trimester screening. This type of testing may be more accurate than first or second trimester screening alone.  Cell-free DNA testing. This is a blood test that detects cells released by the placenta that get into the mother's blood. It can be used to check for a risk of Down syndrome, other extra chromosome syndromes, and disorders caused by abnormal numbers of sex chromosomes. This test can be done any time after 10 weeks of pregnancy.  Diagnostic tests Diagnostic tests carry slight risks of problems, including bleeding, infection, and loss of the pregnancy. These tests are done only if your baby is at risk for a genetic disorder. You may meet with a genetic counselor to discuss the risks and benefits before having diagnostic tests. Examples of diagnostic tests include:  Chorionic villus sampling (CVS). This involves a procedure to remove and test a sample of cells taken from the placenta. The procedure may be done between 10 and 12 weeks of pregnancy.  Amniocentesis. This involves a procedure to remove and test a sample of fluid (amniotic fluid) and cells from the sac that surrounds the developing baby. The procedure may be done between 15 and 20 weeks of pregnancy. What do the results mean? For a screening test:  If the results are negative, it often means that your child is not at higher risk. There is still a slight chance your child could have a genetic disorder.  If the results are positive, it does not mean your child will have a genetic disorder. It may mean that your child has a higher-than-normal risk for a genetic disorder. In that case, you may want to talk with a genetic counselor about whether you should have diagnostic genetic tests. For a diagnostic test:  If the result is negative, it is unlikely that your child will have a genetic disorder.  If the test is positive for a genetic disorder, it is likely that your child will have the disorder. The test may not tell how  severe the disorder will be. Talk with your health care provider about your options. Questions to ask your health care provider Before talking to your health care provider about genetic testing, find out if there is a history of genetic disorders in your family. It may also help to know your family's ethnic origins. Then ask your health  care provider the following questions:  Is my baby at risk for a genetic disorder?  What are the benefits of having genetic screening?  What tests are best for me and my baby?  What are the risks of each test?  If I get a positive result on a screening test, what is the next step?  Should I meet with a genetic counselor before having a diagnostic test?  Should my partner or other members of my family be tested?  How much do the tests cost? Will my insurance cover the testing? Summary  Genetic testing is done during pregnancy to find out whether your child is at risk for a genetic disorder.  Genetic testing is offered to all women before or during pregnancy. You can choose whether to have genetic testing.  There are two basic types of genetic testing. Screening tests indicate whether your developing baby (fetus) is at higher risk for a genetic disorder. Diagnostic tests check actual fetal cells to diagnose a genetic disorder.  If a diagnostic genetic test is positive, talk with your health care provider about your options. This information is not intended to replace advice given to you by your health care provider. Make sure you discuss any questions you have with your health care provider. Document Revised: 06/12/2018 Document Reviewed: 05/06/2017 Elsevier Patient Education  2020 [employer]   Preterm Labor and Birth Information  The normal length of a pregnancy is 39-41 weeks. Preterm labor is when labor starts before 37 completed weeks of pregnancy. What are the risk factors for preterm labor? Preterm labor is more likely to occur in women  who:  Have certain infections during pregnancy such as a bladder infection, sexually transmitted infection, or infection inside the uterus (chorioamnionitis).  Have a shorter-than-normal cervix.  Have gone into preterm labor before.  Have had surgery on their cervix.  Are younger than age 31 or older than age 31.  Are African American.  Are pregnant with twins or multiple babies (multiple gestation).  Take street drugs or smoke while pregnant.  Do not gain enough weight while pregnant.  Became pregnant shortly after having been pregnant. What are the symptoms of preterm labor? Symptoms of preterm labor include:  Cramps similar to those that can happen during a menstrual period. The cramps may happen with diarrhea.  Pain in the abdomen or lower back.  Regular uterine contractions that may feel like tightening of the abdomen.  A feeling of increased pressure in the pelvis.  Increased watery or bloody mucus discharge from the vagina.  Water breaking (ruptured amniotic sac). Why is it important to recognize signs of preterm labor? It is important to recognize signs of preterm labor because babies who are born prematurely may not be fully developed. This can put them at an increased risk for:  Long-term (chronic) heart and lung problems.  Difficulty immediately after birth with regulating body systems, including blood sugar, body temperature, heart rate, and breathing rate.  Bleeding in the brain.  Cerebral palsy.  Learning difficulties.  Death. These risks are highest for babies who are born before 34 weeks of pregnancy. How is preterm labor treated? Treatment depends on the length of your pregnancy, your condition, and the health of your baby. It may involve:  Having a stitch (suture) placed in your cervix to prevent your cervix from opening too early (cerclage).  Taking or being given medicines, such as: ? Hormone medicines. These may be given early in pregnancy  to help support the  pregnancy. ? Medicine to stop contractions. ? Medicines to help mature the baby's lungs. These may be prescribed if the risk of delivery is high. ? Medicines to prevent your baby from developing cerebral palsy. If the labor happens before 34 weeks of pregnancy, you may need to stay in the hospital. What should I do if I think I am in preterm labor? If you think that you are going into preterm labor, call your health care provider right away. How can I prevent preterm labor in future pregnancies? To increase your chance of having a full-term pregnancy:  Do not use any tobacco products, such as cigarettes, chewing tobacco, and e-cigarettes. If you need help quitting, ask your health care provider.  Do not use street drugs or medicines that have not been prescribed to you during your pregnancy.  Talk with your health care provider before taking any herbal supplements, even if you have been taking them regularly.  Make sure you gain a healthy amount of weight during your pregnancy.  Watch for infection. If you think that you might have an infection, get it checked right away.  Make sure to tell your health care provider if you have gone into preterm labor before. This information is not intended to replace advice given to you by your health care provider. Make sure you discuss any questions you have with your health care provider. Document Revised: 06/13/2018 Document Reviewed: 07/13/2015 Elsevier Patient Education  2020 [employer].  Pregnancy and Epilepsy  Epilepsy is a condition in which a person has repeated seizures over time. Epilepsy can cause problems during pregnancy, but with the right care the chances of having a normal, healthy baby are good. Most women with epilepsy have normal pregnancies and healthy babies. What kind of problems can epilepsy cause during pregnancy? The problems that may happen due to epilepsy depend on whether or not you take medicines  for your condition during your pregnancy. Without treatment, epilepsy can put you and your baby at risk for certain problems, including:  More frequent seizures than usual.  Placenta problems.  Premature labor and birth.  Slowed heart rate and low oxygen supply to the baby.  Slower than normal growth in the uterus (intrauterine growth restriction). How is epilepsy treated during pregnancy? During pregnancy, treatment for epilepsy may include:  Taking a medicine that helps prevent seizures (antiepileptic drug). This medicine is commonly prescribed for women who have had seizures within several years of the pregnancy.  Taking a higher than normal doses of folic acid. Folic acid helps prevent your baby from developing spinal cord defects. Working with your health care provider and going to all your prenatal appointments is an important part of treatment. Will my medicine affect my baby? Antiepileptic medicines can affect you and your baby by increasing the risk for:  Vaginal bleeding early in pregnancy.  High blood pressure during pregnancy (preeclampsia).  Birth defects.  Labor induction. This is when you fail to go into labor, and labor has to be started for you.  A cesarean delivery.  Complications after birth. Most of the time, medicines are prescribed anyway because the risk of the medicines harming your baby is lower than the risk of a seizure harming your baby. If your health care provider prescribes this medicine, he or she will prescribe the safest kind of medicine at the lowest dose that will still prevent seizures. You will also have blood tests often to make sure that the medicine is at a safe level. What  if I have a seizure while I am pregnant? Women with epilepsy are more likely to have seizures during pregnancy due to hormonal changes, stress, or a lower dose of seizure medicine. It is important to carefully follow your medicine and seizure-control plans during  pregnancy. If you have a seizure during your pregnancy, your risk for early delivery may increase. Will I be able to breastfeed my baby? Women with epilepsy are encouraged to breastfeed. The antiepileptic drug may pass through your breast milk in small amounts, but the amount is usually not enough to affect your baby. Contact a health care provider if:  You think you are having seizures.  You do not feel the baby moving as much as usual.  You develop tiredness or weakness.  You feel like you are going to faint. Get help right away if:  You have very bad abdominal pain.  You have vaginal bleeding.  You do not feel the baby moving.  You have very bad headaches.  You have vision problems.  A part of your body feels numb.  You cannot stop vomiting. Summary  Epilepsy is a condition in which a person has repeated seizures over time.  Most women with epilepsy have normal pregnancies and healthy babies.  Follow your healthcare provider's recommendations regarding management and treatment of epilepsy during pregnancy. This information is not intended to replace advice given to you by your health care provider. Make sure you discuss any questions you have with your health care provider. Document Revised: 06/13/2018 Document Reviewed: 05/02/2016 Elsevier Patient Education  2020 [employer].

## 2019-05-20 ENCOUNTER — Other Ambulatory Visit: Payer: Self-pay

## 2019-05-20 DIAGNOSIS — O0990 Supervision of high risk pregnancy, unspecified, unspecified trimester: Secondary | ICD-10-CM

## 2019-05-20 LAB — HEMOGLOBIN A1C
Est. average glucose Bld gHb Est-mCnc: 97 mg/dL
Hgb A1c MFr Bld: 5 % (ref 4.8–5.6)

## 2019-05-20 LAB — OBSTETRIC PANEL, INCLUDING HIV
Antibody Screen: NEGATIVE
Basophils Absolute: 0 10*3/uL (ref 0.0–0.2)
Basos: 0 %
EOS (ABSOLUTE): 0.1 10*3/uL (ref 0.0–0.4)
Eos: 1 %
HIV Screen 4th Generation wRfx: NONREACTIVE
Hematocrit: 35.3 % (ref 34.0–46.6)
Hemoglobin: 11.6 g/dL (ref 11.1–15.9)
Hepatitis B Surface Ag: NEGATIVE
Immature Grans (Abs): 0.1 10*3/uL (ref 0.0–0.1)
Immature Granulocytes: 1 %
Lymphocytes Absolute: 2 10*3/uL (ref 0.7–3.1)
Lymphs: 20 %
MCH: 28.9 pg (ref 26.6–33.0)
MCHC: 32.9 g/dL (ref 31.5–35.7)
MCV: 88 fL (ref 79–97)
Monocytes Absolute: 0.8 10*3/uL (ref 0.1–0.9)
Monocytes: 8 %
Neutrophils Absolute: 7.3 10*3/uL — ABNORMAL HIGH (ref 1.4–7.0)
Neutrophils: 70 %
Platelets: 210 10*3/uL (ref 150–450)
RBC: 4.02 x10E6/uL (ref 3.77–5.28)
RDW: 12.3 % (ref 11.7–15.4)
RPR Ser Ql: NONREACTIVE
Rh Factor: POSITIVE
Rubella Antibodies, IGG: 2.82 index (ref >0.99)
WBC: 10.3 10*3/uL (ref 3.4–10.8)

## 2019-05-20 LAB — HEPATITIS C ANTIBODY: Hep C Virus Ab: <0.1 s/co ratio (ref 0.0–0.9)

## 2019-05-20 NOTE — Addendum Note (Signed)
Addended by: Marjo Bicker on: 05/20/2019 03:28 PM   Modules accepted: Orders

## 2019-05-21 ENCOUNTER — Encounter: Payer: Self-pay | Admitting: *Deleted

## 2019-05-25 ENCOUNTER — Encounter: Payer: Self-pay | Admitting: Neurology

## 2019-05-25 ENCOUNTER — Telehealth (INDEPENDENT_AMBULATORY_CARE_PROVIDER_SITE_OTHER): Payer: Medicaid Other | Admitting: Family Medicine

## 2019-05-25 ENCOUNTER — Other Ambulatory Visit: Payer: Self-pay

## 2019-05-25 ENCOUNTER — Encounter: Payer: Self-pay | Admitting: *Deleted

## 2019-05-25 ENCOUNTER — Ambulatory Visit: Payer: Medicaid Other | Admitting: Neurology

## 2019-05-25 DIAGNOSIS — Z0130 Encounter for examination of blood pressure without abnormal findings: Secondary | ICD-10-CM

## 2019-05-25 DIAGNOSIS — G40009 Localization-related (focal) (partial) idiopathic epilepsy and epileptic syndromes with seizures of localized onset, not intractable, without status epilepticus: Secondary | ICD-10-CM | POA: Diagnosis not present

## 2019-05-25 MED ORDER — LEVETIRACETAM 1000 MG PO TABS: 1000.0000 mg | ORAL_TABLET | Freq: Two times a day (BID) | ORAL | 6 refills | Status: DC
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
## 2019-05-25 NOTE — Telephone Encounter (Signed)
The patient called in stating she was told to pick up a blood pressure cuff however she got to the pharmacy she was told the order was not placed. She would like a call back.

## 2019-05-25 NOTE — Patient Instructions (Signed)
Start keppra email me with how you are doing  Levetiracetam tablets What is this medicine? LEVETIRACETAM (lee ve tye RA se tam) is an antiepileptic drug. It is used with other medicines to treat certain types of seizures. This medicine may be used for other purposes; ask your health care provider or pharmacist if you have questions. COMMON BRAND NAME(S): Keppra, Roweepra What should I tell my health care provider before I take this medicine? They need to know if you have any of these conditions:  kidney disease  suicidal thoughts, plans, or attempt; a previous suicide attempt by you or a family member  an unusual or allergic reaction to levetiracetam, other medicines, foods, dyes, or preservatives  pregnant or trying to get pregnant  breast-feeding How should I use this medicine? Take this medicine by mouth with a glass of water. Follow the directions on the prescription label. Swallow the tablets whole. Do not crush or chew this medicine. You may take this medicine with or without food. Take your doses at regular intervals. Do not take your medicine more often than directed. Do not stop taking this medicine or any of your seizure medicines unless instructed by your doctor or health care professional. Stopping your medicine suddenly can increase your seizures or their severity. A special MedGuide will be given to you by the pharmacist with each prescription and refill. Be sure to read this information carefully each time. Contact your pediatrician or health care professional regarding the use of this medication in children. While this drug may be prescribed for children as young as 75 years of age for selected conditions, precautions do apply. Overdosage: If you think you have taken too much of this medicine contact a poison control center or emergency room at once. NOTE: This medicine is only for you. Do not share this medicine with others. What if I miss a dose? If you miss a dose, take  it as soon as you can. If it is almost time for your next dose, take only that dose. Do not take double or extra doses. What may interact with this medicine? This medicine may interact with the following medications:  carbamazepine  colesevelam  probenecid  sevelamer This list may not describe all possible interactions. Give your health care provider a list of all the medicines, herbs, non-prescription drugs, or dietary supplements you use. Also tell them if you smoke, drink alcohol, or use illegal drugs. Some items may interact with your medicine. What should I watch for while using this medicine? Visit your doctor or health care provider for a regular check on your progress. Wear a medical identification bracelet or chain to say you have epilepsy, and carry a card that lists all your medications. This medicine may cause serious skin reactions. They can happen weeks to months after starting the medicine. Contact your health care provider right away if you notice fevers or flu-like symptoms with a rash. The rash may be red or purple and then turn into blisters or peeling of the skin. Or, you might notice a red rash with swelling of the face, lips or lymph nodes in your neck or under your arms. It is important to take this medicine exactly as instructed by your health care provider. When first starting treatment, your dose may need to be adjusted. It may take weeks or months before your dose is stable. You should contact your doctor or health care provider if your seizures get worse or if you have any new types of  seizures. You may get drowsy or dizzy. Do not drive, use machinery, or do anything that needs mental alertness until you know how this medicine affects you. Do not stand or sit up quickly, especially if you are an older patient. This reduces the risk of dizzy or fainting spells. Alcohol may interfere with the effect of this medicine. Avoid alcoholic drinks. The use of this medicine may  increase the chance of suicidal thoughts or actions. Pay special attention to how you are responding while on this medicine. Any worsening of mood, or thoughts of suicide or dying should be reported to your health care provider right away. Women who become pregnant while using this medicine may enroll in the [facility] Pregnancy Registry by calling [phone]. This registry collects information about the safety of antiepileptic drug use during pregnancy. What side effects may I notice from receiving this medicine? Side effects that you should report to your doctor or health care professional as soon as possible:  allergic reactions like skin rash, itching or hives, swelling of the face, lips, or tongue  breathing problems  dark urine  general ill feeling or flu-like symptoms  problems with balance, talking, walking  rash, fever, and swollen lymph nodes  redness, blistering, peeling or loosening of the skin, including inside the mouth  unusually weak or tired  worsening of mood, thoughts or actions of suicide or dying  yellowing of the eyes or skin Side effects that usually do not require medical attention (report to your doctor or health care professional if they continue or are bothersome):  diarrhea  dizzy, drowsy  headache  loss of appetite This list may not describe all possible side effects. Call your doctor for medical advice about side effects. You may report side effects to FDA at 1-800-FDA-1088. Where should I keep my medicine? Keep out of reach of children. Store at room temperature between 15 and 30 degrees C (59 and 86 degrees F). Throw away any unused medicine after the expiration date. NOTE: This sheet is a summary. It may not cover all possible information. If you have questions about this medicine, talk to your doctor, pharmacist, or health care provider.  2020 Elsevier/Gold Standard (2018-05-23 15:23:36)

## 2019-05-25 NOTE — Telephone Encounter (Signed)
Called [facility] to verify BP cuff is ready for pickup. Called pt who states she went to [place] on [address]. Explained to pt that [facility] fills all BP cuff prescriptions. Address and phone number given to pt; pt states she plans to pick up BP cuff.

## 2019-05-26 ENCOUNTER — Other Ambulatory Visit: Payer: Medicaid Other

## 2019-05-26 ENCOUNTER — Ambulatory Visit (HOSPITAL_COMMUNITY): Payer: Medicaid Other

## 2019-05-27 ENCOUNTER — Encounter: Payer: Medicaid Other | Admitting: Obstetrics and Gynecology

## 2019-05-28 DIAGNOSIS — Z98891 History of uterine scar from previous surgery: Secondary | ICD-10-CM

## 2019-05-28 DIAGNOSIS — O0990 Supervision of high risk pregnancy, unspecified, unspecified trimester: Secondary | ICD-10-CM

## 2019-05-28 DIAGNOSIS — N949 Unspecified condition associated with female genital organs and menstrual cycle: Secondary | ICD-10-CM

## 2019-05-28 DIAGNOSIS — T7491XA Unspecified adult maltreatment, confirmed, initial encounter: Secondary | ICD-10-CM

## 2019-05-28 DIAGNOSIS — F1290 Cannabis use, unspecified, uncomplicated: Secondary | ICD-10-CM

## 2019-06-03 ENCOUNTER — Other Ambulatory Visit: Payer: Medicaid Other

## 2019-06-10 ENCOUNTER — Encounter: Payer: Medicaid Other | Admitting: Obstetrics and Gynecology

## 2019-06-17 ENCOUNTER — Other Ambulatory Visit: Payer: Medicaid Other

## 2019-06-18 ENCOUNTER — Ambulatory Visit (HOSPITAL_COMMUNITY): Payer: Medicaid Other

## 2019-06-18 ENCOUNTER — Encounter (HOSPITAL_COMMUNITY): Payer: Self-pay

## 2019-06-19 ENCOUNTER — Encounter (HOSPITAL_COMMUNITY): Payer: Self-pay

## 2019-06-19 ENCOUNTER — Encounter (HOSPITAL_COMMUNITY): Payer: Medicaid Other

## 2019-06-19 ENCOUNTER — Ambulatory Visit (HOSPITAL_COMMUNITY)
Admission: RE | Admit: 2019-06-19 | Discharge: 2019-06-19 | Disposition: A | Discharge status: Home / Self Care | Payer: Medicaid Other | Source: Ambulatory Visit | Attending: Obstetrics and Gynecology | Admitting: Obstetrics and Gynecology

## 2019-06-19 ENCOUNTER — Ambulatory Visit (HOSPITAL_COMMUNITY): Admission: RE | Admit: 2019-06-19 | Payer: Medicaid Other | Source: Ambulatory Visit

## 2019-06-19 ENCOUNTER — Ambulatory Visit (HOSPITAL_COMMUNITY): Payer: Medicaid Other | Admitting: *Deleted

## 2019-06-19 ENCOUNTER — Other Ambulatory Visit: Payer: Self-pay

## 2019-06-19 ENCOUNTER — Ambulatory Visit (HOSPITAL_BASED_OUTPATIENT_CLINIC_OR_DEPARTMENT_OTHER): Payer: Medicaid Other | Admitting: Obstetrics and Gynecology

## 2019-06-19 ENCOUNTER — Other Ambulatory Visit (HOSPITAL_COMMUNITY): Payer: Self-pay | Admitting: Obstetrics and Gynecology

## 2019-06-19 ENCOUNTER — Ambulatory Visit (HOSPITAL_COMMUNITY): Payer: Medicaid Other

## 2019-06-19 DIAGNOSIS — O0990 Supervision of high risk pregnancy, unspecified, unspecified trimester: Secondary | ICD-10-CM

## 2019-06-19 DIAGNOSIS — O99353 Diseases of the nervous system complicating pregnancy, third trimester: Secondary | ICD-10-CM

## 2019-06-19 DIAGNOSIS — Z363 Encounter for antenatal screening for malformations: Secondary | ICD-10-CM | POA: Diagnosis not present

## 2019-06-19 DIAGNOSIS — O99213 Obesity complicating pregnancy, third trimester: Secondary | ICD-10-CM

## 2019-06-19 DIAGNOSIS — O34219 Maternal care for unspecified type scar from previous cesarean delivery: Secondary | ICD-10-CM

## 2019-06-19 DIAGNOSIS — O0933 Supervision of pregnancy with insufficient antenatal care, third trimester: Secondary | ICD-10-CM | POA: Insufficient documentation

## 2019-06-19 DIAGNOSIS — Z98891 History of uterine scar from previous surgery: Secondary | ICD-10-CM | POA: Insufficient documentation

## 2019-06-19 DIAGNOSIS — F1290 Cannabis use, unspecified, uncomplicated: Secondary | ICD-10-CM

## 2019-06-19 DIAGNOSIS — G40909 Epilepsy, unspecified, not intractable, without status epilepticus: Secondary | ICD-10-CM | POA: Diagnosis present

## 2019-06-19 DIAGNOSIS — T7491XA Unspecified adult maltreatment, confirmed, initial encounter: Secondary | ICD-10-CM | POA: Diagnosis present

## 2019-06-19 DIAGNOSIS — E669 Obesity, unspecified: Secondary | ICD-10-CM

## 2019-06-19 DIAGNOSIS — Z3A37 37 weeks gestation of pregnancy: Secondary | ICD-10-CM

## 2019-06-19 DIAGNOSIS — O0993 Supervision of high risk pregnancy, unspecified, third trimester: Secondary | ICD-10-CM | POA: Diagnosis not present

## 2019-06-19 DIAGNOSIS — N949 Unspecified condition associated with female genital organs and menstrual cycle: Secondary | ICD-10-CM | POA: Insufficient documentation

## 2019-06-19 NOTE — Consult Note (Addendum)
Delete note

## 2019-06-19 NOTE — Progress Notes (Signed)
Maternal-Fetal Medicine  Name: Natalie Lawrence DOB: March 04, 1991 MRN: [id number] Referring Provider: Donia Ast, CNM  I had  The pleasure of seeing [facility] at [facility]. She is G4 P3004 at 37w 3d gestation with past medical history of epilepsy. Patient reports that in Feb 2015 her epilepsy was diagnosed. She had intermittent care with her neurologists. Seizures, apparently, occurred only during sleep. Recent episodes of seizures occurred on 03/14, 03/22 and 06/15/19. Her most recent seizure was witnessed by her partner. He found her making "gurgling noise" during sleep and the patient woke up confused, hot and flushed.  She had neurology consultation on 05/25/2019 with Dr. Lucia Gaskins (see note in Epic). She was prescribed to take Keppra 1,000 mg twice daily.  Patient informed her neurologist and reiterated today that she cannot tolerate Keppra and does not want to take any other anticonvulsant medication including lamictal. She does not have hypertension or diabetes or any other chronic medical conditions.  Past surgical history: Cesarean section. Medications prenatal vitamins, folic acid. Allergies: No known drug allergies Social history denies tobacco drug or alcohol use.  She is single and her partner is in good health. Family history: Father has hypertension; no history of seizure disorders in the family; no history of venous thromboembolism in the family.  Obstetric history is significant for a term vaginal delivery in 2010 of a female infant.  In 2012, she had twin cesarean delivery at term of 2 female children.  Subsequently, in 2018 she had VBAC of a female infant.  All her children are in good health. PSH: Ces GYN history: No history of abnormal Pap smears or cervical surgeries.  No history of any breast disease.  Prenatal care: Patient initiated her prenatal care in [place] and fetal anatomy scan was reportedly normal.  She desires bilateral tubal  ligation after surgery.  Patient is planning VBAC.  On today's ultrasound, fetal growth is appropriate for her gestational age.  Amniotic fluid is normal and good fetal activity seen.  Cephalic presentation.  Fetal anatomical survey appears normal but limited by advanced gestational age. Fetal breathing movements did not meet the criteria of BPP.  NST is reactive.  BPP 8/10.  I counseled the patient on following: Epilepsy in pregnancy: -I briefly discussed the physiological changes in pregnancy and the requirement of higher dosage of anticonvulsants. -Emphasized the importance of taking anticonvulsant medication to prevent maternal complications.  Seizure activity is slightly increased the risk of placental abruption. -Women who have uncontrolled seizures in pregnancy are more likely to have seizures during labor.  Seizure activity during labor can cause fetal bradycardia that may lead to unnecessary cesarean sections. I asked her to consult with a neurologist try alternative medication or if possible to take anticonvulsants till delivery. -Patient informed that she is reluctant to take anticonvulsants.  She informed that she is not in an unsafe situation and always has the help of members of her family. -She reports she does not have any clear trigger factors and it occurs only during sleep.   Recommendations: -Epilepsy precautions (discussed by her neurologist in detail). -Weekly BPP to continue till delivery. -If cervix is favorable, induction may be considered at 39 to 40 weeks.  -During labor, lorazepam should be available at short notice (order from pharmacy). If seizure occurs, lorazepam 1 mg to 2 mg (maximum 4 mg) may be given. Discuss with neurologist before admission if elective induction is planned. -Seizure precautions during labor (to prevent fall). -If seizure occurs  during labor, we anticipate fetal bradycardia that should gradually improve. Decision to perform cesarean delivery  will be made by the obstetrician. -Vitamin K injection in the third trimester is not indicated.  Thank you for consultation.  Please contact me at the [facility] if you have any questions. Consultation including face-to-face counseling 40 minutes.

## 2019-06-19 NOTE — Procedures (Signed)
Natalie Lawrence Sep 06, 1990 [redacted]w[redacted]d  Fetus A Non-Stress Test Interpretation for 06/19/19  Indication: Unsatisfactory BPP  Fetal Heart Rate A Mode: External Baseline Rate (A): 130 bpm Variability: Moderate Accelerations: 15 x 15 Decelerations: None Multiple birth?: No  Uterine Activity Mode: Toco Contraction Frequency (min): none noted  Interpretation (Fetal Testing) Nonstress Test Interpretation: Reactive Comments: FHR tracing rev'd by Dr. Judeth Cornfield

## 2019-06-22 ENCOUNTER — Other Ambulatory Visit (HOSPITAL_COMMUNITY): Payer: Self-pay | Admitting: *Deleted

## 2019-06-22 ENCOUNTER — Encounter: Payer: Self-pay | Admitting: Neurology

## 2019-06-22 DIAGNOSIS — G40909 Epilepsy, unspecified, not intractable, without status epilepticus: Secondary | ICD-10-CM

## 2019-06-22 DIAGNOSIS — O99353 Diseases of the nervous system complicating pregnancy, third trimester: Secondary | ICD-10-CM

## 2019-06-24 ENCOUNTER — Ambulatory Visit (HOSPITAL_COMMUNITY): Payer: Medicaid Other | Admitting: *Deleted

## 2019-06-24 ENCOUNTER — Other Ambulatory Visit: Payer: Self-pay

## 2019-06-24 ENCOUNTER — Ambulatory Visit (HOSPITAL_COMMUNITY)
Admission: RE | Admit: 2019-06-24 | Discharge: 2019-06-24 | Disposition: A | Discharge status: Home / Self Care | Payer: Medicaid Other | Source: Ambulatory Visit | Attending: Obstetrics and Gynecology | Admitting: Obstetrics and Gynecology

## 2019-06-24 ENCOUNTER — Encounter (HOSPITAL_COMMUNITY): Payer: Self-pay

## 2019-06-24 DIAGNOSIS — N949 Unspecified condition associated with female genital organs and menstrual cycle: Secondary | ICD-10-CM | POA: Diagnosis present

## 2019-06-24 DIAGNOSIS — O99353 Diseases of the nervous system complicating pregnancy, third trimester: Secondary | ICD-10-CM

## 2019-06-24 DIAGNOSIS — Z3A38 38 weeks gestation of pregnancy: Secondary | ICD-10-CM

## 2019-06-24 DIAGNOSIS — Z98891 History of uterine scar from previous surgery: Secondary | ICD-10-CM | POA: Insufficient documentation

## 2019-06-24 DIAGNOSIS — T7491XA Unspecified adult maltreatment, confirmed, initial encounter: Secondary | ICD-10-CM

## 2019-06-24 DIAGNOSIS — F1290 Cannabis use, unspecified, uncomplicated: Secondary | ICD-10-CM | POA: Diagnosis present

## 2019-06-24 DIAGNOSIS — O0990 Supervision of high risk pregnancy, unspecified, unspecified trimester: Secondary | ICD-10-CM | POA: Diagnosis present

## 2019-06-24 DIAGNOSIS — G40909 Epilepsy, unspecified, not intractable, without status epilepticus: Secondary | ICD-10-CM | POA: Diagnosis not present

## 2019-06-26 ENCOUNTER — Encounter: Payer: Medicaid Other | Admitting: Obstetrics and Gynecology

## 2019-06-29 ENCOUNTER — Other Ambulatory Visit: Payer: Self-pay

## 2019-06-29 ENCOUNTER — Ambulatory Visit (INDEPENDENT_AMBULATORY_CARE_PROVIDER_SITE_OTHER): Payer: Medicaid Other | Admitting: Obstetrics and Gynecology

## 2019-06-29 ENCOUNTER — Other Ambulatory Visit (HOSPITAL_COMMUNITY)
Admission: RE | Admit: 2019-06-29 | Discharge: 2019-06-29 | Disposition: A | Discharge status: Home / Self Care | Payer: Medicaid Other | Source: Ambulatory Visit | Attending: Obstetrics and Gynecology | Admitting: Obstetrics and Gynecology

## 2019-06-29 VITALS — BP 110/73 | HR 97 | Wt 242.2 lb

## 2019-06-29 DIAGNOSIS — R569 Unspecified convulsions: Secondary | ICD-10-CM | POA: Diagnosis not present

## 2019-06-29 DIAGNOSIS — O99211 Obesity complicating pregnancy, first trimester: Secondary | ICD-10-CM

## 2019-06-29 DIAGNOSIS — Z6841 Body Mass Index (BMI) 40.0 and over, adult: Secondary | ICD-10-CM | POA: Insufficient documentation

## 2019-06-29 DIAGNOSIS — O0990 Supervision of high risk pregnancy, unspecified, unspecified trimester: Secondary | ICD-10-CM | POA: Insufficient documentation

## 2019-06-29 DIAGNOSIS — E669 Obesity, unspecified: Secondary | ICD-10-CM

## 2019-06-29 DIAGNOSIS — Z3A38 38 weeks gestation of pregnancy: Secondary | ICD-10-CM

## 2019-06-29 DIAGNOSIS — O0993 Supervision of high risk pregnancy, unspecified, third trimester: Secondary | ICD-10-CM | POA: Diagnosis not present

## 2019-06-29 DIAGNOSIS — Z98891 History of uterine scar from previous surgery: Secondary | ICD-10-CM

## 2019-06-29 DIAGNOSIS — O0992 Supervision of high risk pregnancy, unspecified, second trimester: Secondary | ICD-10-CM

## 2019-06-29 LAB — GLUCOSE, CAPILLARY: Glucose-Capillary: 98 mg/dL (ref 70–99)
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
## 2019-06-30 LAB — CERVICOVAGINAL ANCILLARY ONLY
Chlamydia: NEGATIVE
Comment: NEGATIVE
Comment: NEGATIVE
Comment: NORMAL
Neisseria Gonorrhea: NEGATIVE
Trichomonas: NEGATIVE

## 2019-07-01 ENCOUNTER — Encounter: Payer: Self-pay | Admitting: Obstetrics and Gynecology

## 2019-07-01 ENCOUNTER — Other Ambulatory Visit: Payer: Self-pay | Admitting: *Deleted

## 2019-07-01 ENCOUNTER — Inpatient Hospital Stay (HOSPITAL_COMMUNITY)
Admission: AD | Admit: 2019-07-01 | Discharge: 2019-07-04 | DRG: 797 | Disposition: A | Discharge status: Home / Self Care | Payer: Medicaid Other | Attending: Obstetrics and Gynecology | Admitting: Obstetrics and Gynecology

## 2019-07-01 ENCOUNTER — Encounter (HOSPITAL_COMMUNITY): Payer: Self-pay | Admitting: Obstetrics and Gynecology

## 2019-07-01 ENCOUNTER — Other Ambulatory Visit: Payer: Medicaid Other

## 2019-07-01 ENCOUNTER — Encounter: Payer: Self-pay | Admitting: *Deleted

## 2019-07-01 ENCOUNTER — Other Ambulatory Visit: Payer: Self-pay

## 2019-07-01 DIAGNOSIS — F1290 Cannabis use, unspecified, uncomplicated: Secondary | ICD-10-CM | POA: Diagnosis present

## 2019-07-01 DIAGNOSIS — Z6841 Body Mass Index (BMI) 40.0 and over, adult: Secondary | ICD-10-CM

## 2019-07-01 DIAGNOSIS — O99354 Diseases of the nervous system complicating childbirth: Secondary | ICD-10-CM | POA: Diagnosis present

## 2019-07-01 DIAGNOSIS — O34219 Maternal care for unspecified type scar from previous cesarean delivery: Principal | ICD-10-CM | POA: Diagnosis present

## 2019-07-01 DIAGNOSIS — O0990 Supervision of high risk pregnancy, unspecified, unspecified trimester: Secondary | ICD-10-CM

## 2019-07-01 DIAGNOSIS — E669 Obesity, unspecified: Secondary | ICD-10-CM | POA: Diagnosis present

## 2019-07-01 DIAGNOSIS — O99820 Streptococcus B carrier state complicating pregnancy: Secondary | ICD-10-CM

## 2019-07-01 DIAGNOSIS — G40909 Epilepsy, unspecified, not intractable, without status epilepticus: Secondary | ICD-10-CM | POA: Diagnosis present

## 2019-07-01 DIAGNOSIS — O99211 Obesity complicating pregnancy, first trimester: Secondary | ICD-10-CM | POA: Diagnosis present

## 2019-07-01 DIAGNOSIS — O99824 Streptococcus B carrier state complicating childbirth: Secondary | ICD-10-CM | POA: Diagnosis present

## 2019-07-01 DIAGNOSIS — Z3A39 39 weeks gestation of pregnancy: Secondary | ICD-10-CM

## 2019-07-01 DIAGNOSIS — Z87891 Personal history of nicotine dependence: Secondary | ICD-10-CM

## 2019-07-01 DIAGNOSIS — Z20822 Contact with and (suspected) exposure to covid-19: Secondary | ICD-10-CM | POA: Diagnosis present

## 2019-07-01 DIAGNOSIS — O99214 Obesity complicating childbirth: Secondary | ICD-10-CM | POA: Diagnosis present

## 2019-07-01 DIAGNOSIS — Z98891 History of uterine scar from previous surgery: Secondary | ICD-10-CM

## 2019-07-01 DIAGNOSIS — Z9851 Tubal ligation status: Secondary | ICD-10-CM

## 2019-07-01 DIAGNOSIS — R569 Unspecified convulsions: Secondary | ICD-10-CM

## 2019-07-01 DIAGNOSIS — Z302 Encounter for sterilization: Secondary | ICD-10-CM

## 2019-07-01 HISTORY — DX: Streptococcus B carrier state complicating pregnancy: O99.820

## 2019-07-01 LAB — CBC
HCT: 37.7 % (ref 36.0–46.0)
Hemoglobin: 12 g/dL (ref 12.0–15.0)
MCH: 27.8 pg (ref 26.0–34.0)
MCHC: 31.8 g/dL (ref 30.0–36.0)
MCV: 87.5 fL (ref 80.0–100.0)
Platelets: 187 10*3/uL (ref 150–400)
RBC: 4.31 MIL/uL (ref 3.87–5.11)
RDW: 13.4 % (ref 11.5–15.5)
WBC: 13.4 10*3/uL — ABNORMAL HIGH (ref 4.0–10.5)
nRBC: 0 % (ref 0.0–0.2)

## 2019-07-01 LAB — COMPREHENSIVE METABOLIC PANEL
ALT: 13 U/L (ref 0–44)
AST: 18 U/L (ref 15–41)
Albumin: 3 g/dL — ABNORMAL LOW (ref 3.5–5.0)
Alkaline Phosphatase: 118 U/L (ref 38–126)
Anion gap: 12 (ref 5–15)
BUN: 7 mg/dL (ref 6–20)
CO2: 17 mmol/L — ABNORMAL LOW (ref 22–32)
Calcium: 8.9 mg/dL (ref 8.9–10.3)
Chloride: 108 mmol/L (ref 98–111)
Creatinine, Ser: 0.66 mg/dL (ref 0.44–1.00)
GFR calc Af Amer: >60 mL/min (ref >60)
GFR calc non Af Amer: >60 mL/min (ref >60)
Glucose, Bld: 83 mg/dL (ref 70–99)
Potassium: 3.8 mmol/L (ref 3.5–5.1)
Sodium: 137 mmol/L (ref 135–145)
Total Bilirubin: 0.8 mg/dL (ref 0.3–1.2)
Total Protein: 6.8 g/dL (ref 6.5–8.1)

## 2019-07-01 LAB — PROTEIN / CREATININE RATIO, URINE
Creatinine, Urine: 354.68 mg/dL
Protein Creatinine Ratio: 0.07 mg/mg{Cre} (ref 0.00–0.15)
Total Protein, Urine: 24 mg/dL

## 2019-07-01 LAB — URINALYSIS, ROUTINE W REFLEX MICROSCOPIC
Bilirubin Urine: NEGATIVE
Glucose, UA: NEGATIVE mg/dL
Hgb urine dipstick: NEGATIVE
Ketones, ur: 5 mg/dL — AB
Nitrite: NEGATIVE
Protein, ur: 100 mg/dL — AB
Specific Gravity, Urine: 1.032 — ABNORMAL HIGH (ref 1.005–1.030)
pH: 5 (ref 5.0–8.0)

## 2019-07-01 LAB — LIPASE, BLOOD: Lipase: 33 U/L (ref 11–51)

## 2019-07-01 LAB — STREP GP B NAA: Strep Gp B NAA: POSITIVE — AB

## 2019-07-01 NOTE — MAU Provider Note (Signed)
Chief Complaint:  Abdominal Pain   First Provider Initiated Contact with Patient 07/01/19 2233     HPI  HPI: Natalie Lawrence is a 29 y.o. J4G9201 at 66w1dho presents to maternity admissions reporting sharp abdominal pain all over her abdomen.  Reports intermittent tightening.  [facility] who brought her here.  Was advised to be induced due to seizures, but had declined.  . She reports good fetal movement, denies LOF, vaginal bleeding, vaginal itching/burning, urinary symptoms, h/a, dizziness, n/v, diarrhea, constipation or fever/chills.  She denies headache, visual changes or RUQ abdominal pain.  RN Note: Pt reports to MAU via EMS c/o sharp abdominal pain that is constant from the top of her abdomen to her vagina. No bleeding. Pt reports normal discharge. +FM.   Past Medical History: Past Medical History:  Diagn[place] Date  . [place]     Past obstetric history: OB History  Gravida Para Term Preterm AB Living  4 3 3  0 0 4  SAB TAB Ectopic Multiple Live Births  0 0 0 1 4    # Outcome Date GA Lbr Len/2nd Weight Sex Delivery Anes PTL Lv  4 Current           3 Term 01/10/17 [id number] g M VBAC   LIV  2A Term 06/30/10    F CS-LTranv   LIV  2B Term 06/30/10    F    LIV  1 Term 12/30/08 [id number] Vag-Spont   LIV    Obstetric Comments                                                                                          Past Surgical History: Past Surgical History:  Procedure Laterality Date  . CESAREAN SECTION  2012    Family History: Family History  Problem Relation Age of Onset  . Hypertension Paternal Grandmother   . Diabetes Paternal Grandmother   . Breast cancer Paternal Grandmother   . Hypertension Paternal Grandfather   . Seizures Neg Hx     Social History: Social History   Tobacco Use  . Smoking status: Former Smoker    Types: Cigarettes  . Smokeless tobacco: Never Used   Substance Use Topics  . Alcohol use: No  . Drug use: No    Comment: h/o smoking marijuana     Allergies: No Known Allergies  Meds:  Medications Prior to Admission  Medication Sig Dispense Refill Last Dose  . folic acid (FOLVITE) 1 MG tablet Take 1 tablet (1 mg total) by mouth daily. 30 tablet 3 07/01/2019 at Unknown time  . Prenatal Vit-Iron Carbonyl-FA (PRENATABS RX) 29-1 MG TABS Take 1 tablet by mouth daily.   07/01/2019 at Unknown time  . Blood Pressure Monitoring (BLOOD PRESSURE KIT) DEVI 1 Device by Does not apply route daily. ICD 10: Z34.00 (Patient not taking: Reported on 06/29/2019) 1 each 0   . Elastic Bandages & Supports (COMFORT FIT MATERNITY SUPP SM) MISC 1 Units by Does not apply route daily as needed. (Patient not taking: Reported  on 06/29/2019) 1 each 0   . levETIRAcetam (KEPPRA) 1000 MG tablet Take 1 tablet (1,000 mg total) by mouth 2 (two) times daily. (Patient not taking: Reported on 06/19/2019) 60 tablet 6     I have reviewed patient's Past Medical Hx, Surgical Hx, Family Hx, Social Hx, medications and allergies.   ROS:  Review of Systems  Constitutional: Negative for chills and fever.  Eyes: Negative for visual disturbance.  Respiratory: Negative for shortness of breath.   Cardiovascular: Negative for leg swelling.  Gastrointestinal: Positive for abdominal pain. Negative for constipation, diarrhea and nausea.  Genitourinary: Positive for pelvic pain. Negative for dysuria, vaginal bleeding and vaginal discharge.  Musculoskeletal: Negative for back pain.  Neurological: Negative for dizziness and weakness.   Other systems negative  Physical Exam   Patient Vitals for the past 24 hrs:  BP Temp Temp src Pulse  07/01/19 2207 106/65 97.9 F (36.6 C) Oral 96   Constitutional: Well-developed, well-nourished female in no acute distress.  Cardiovascular: normal rate and rhythm Respiratory: normal effort, clear to auscultation bilaterally GI: Abd soft, diffusely tender,  gravid appropriate for gestational age.   No rebound or guarding. MS: Extremities nontender, no edema, normal ROM Neurologic: Alert and oriented x 4.  GU: Neg CVAT.  PELVIC EXAM: Dilation: 3 Effacement (%): 50 Cervical Position: Posterior Station: -3 Presentation: Vertex Exam by:: Cox RN  FHT:  Baseline 140 , moderate variability, accelerations present, no decelerations Contractions: q 3 mins Irregular     Labs: Results for orders placed or performed during the hospital encounter of 07/01/19 (from the past 24 hour(s))  Urinalysis, Routine w reflex microscopic     Status: Abnormal   Collection Time: 07/01/19 10:44 PM  Result Value Ref Range   Color, Urine AMBER (A) YELLOW   APPearance HAZY (A) CLEAR   Specific Gravity, Urine 1.032 (H) 1.005 - 1.030   pH 5.0 5.0 - 8.0   Glucose, UA NEGATIVE NEGATIVE mg/dL   Hgb urine dipstick NEGATIVE NEGATIVE   Bilirubin Urine NEGATIVE NEGATIVE   Ketones, ur 5 (A) NEGATIVE mg/dL   Protein, ur 100 (A) NEGATIVE mg/dL   Nitrite NEGATIVE NEGATIVE   Leukocytes,Ua SMALL (A) NEGATIVE   RBC / HPF 0-5 0 - 5 RBC/hpf   WBC, UA 6-10 0 - 5 WBC/hpf   Bacteria, UA FEW (A) NONE SEEN   Squamous Epithelial / LPF 6-10 0 - 5   Mucus PRESENT   Protein / creatinine ratio, urine     Status: None   Collection Time: 07/01/19 10:45 PM  Result Value Ref Range   Creatinine, Urine 354.68 mg/dL   Total Protein, Urine 24 mg/dL   Protein Creatinine Ratio 0.07 0.00 - 0.15 mg/mg[Cre]  CBC     Status: Abnormal   Collection Time: 07/01/19 10:46 PM  Result Value Ref Range   WBC 13.4 (H) 4.0 - 10.5 K/uL   RBC 4.31 3.87 - 5.11 MIL/uL   Hemoglobin 12.0 12.0 - 15.0 g/dL   HCT 37.7 36.0 - 46.0 %   MCV 87.5 80.0 - 100.0 fL   MCH 27.8 26.0 - 34.0 pg   MCHC 31.8 30.0 - 36.0 g/dL   RDW 13.4 11.5 - 15.5 %   Platelets 187 150 - 400 K/uL   nRBC 0.0 0.0 - 0.2 %  Comprehensive metabolic panel     Status: Abnormal   Collection Time: 07/01/19 10:46 PM  Result Value Ref Range    Sodium 137 135 - 145 mmol/L   Potassium  3.8 3.5 - 5.1 mmol/L   Chloride 108 98 - 111 mmol/L   CO2 17 (L) 22 - 32 mmol/L   Glucose, Bld 83 70 - 99 mg/dL   BUN 7 6 - 20 mg/dL   Creatinine, Ser 0.66 0.44 - 1.00 mg/dL   Calcium 8.9 8.9 - 10.3 mg/dL   Total Protein 6.8 6.5 - 8.1 g/dL   Albumin 3.0 (L) 3.5 - 5.0 g/dL   AST 18 15 - 41 U/L   ALT 13 0 - 44 U/L   Alkaline Phosphatase 118 38 - 126 U/L   Total Bilirubin 0.8 0.3 - 1.2 mg/dL   GFR calc non Af Amer >60 >60 mL/min   GFR calc Af Amer >60 >60 mL/min   Anion gap 12 5 - 15  Lipase, blood     Status: None   Collection Time: 07/01/19 10:46 PM  Result Value Ref Range   Lipase 33 11 - 51 U/L    B/Positive/-- (03/16 1546)  Imaging:    MAU Course/MDM: I have ordered labs and reviewed results. Lab results are normal  Patient has continued with regular and increasingly painful contractions since arrival.  Discussed that I think the pain is related to contractions and not any acute abdominal process.  Cervix has changed over time while here.  NST reviewed, reactive    Assessment: Single IUP at 15w2dLatent phase labor with cervical change Seizure disorder History of prior C/S, desires TOLAC GBS +  Plan: Admit to Labor and Delivery Delivery team to follow  MHansel FeinsteinCNM, MSN Certified Nurse-Midwife 07/01/2019 10:33 PM

## 2019-07-01 NOTE — MAU Note (Signed)
Pt reports to MAU via EMS c/o sharp abdominal pain that is constant from the top of her abdomen to her vagina. No bleeding. Pt reports normal discharge. +FM.

## 2019-07-01 NOTE — Progress Notes (Signed)
It looks like patient no showed to GTT and antenatal testing visits for today. inbasket message sent to reschedule for this week with hrob visit  Cornelia Copa MD Attending Center for [facility] (Faculty Practice) 07/01/2019 Time: 1114am

## 2019-07-02 ENCOUNTER — Inpatient Hospital Stay (HOSPITAL_COMMUNITY): Payer: Medicaid Other | Admitting: Anesthesiology

## 2019-07-02 ENCOUNTER — Other Ambulatory Visit: Payer: Self-pay

## 2019-07-02 ENCOUNTER — Encounter (HOSPITAL_COMMUNITY): Payer: Self-pay | Admitting: Obstetrics and Gynecology

## 2019-07-02 DIAGNOSIS — G40909 Epilepsy, unspecified, not intractable, without status epilepticus: Secondary | ICD-10-CM | POA: Diagnosis not present

## 2019-07-02 DIAGNOSIS — O99214 Obesity complicating childbirth: Secondary | ICD-10-CM | POA: Diagnosis not present

## 2019-07-02 DIAGNOSIS — Z87891 Personal history of nicotine dependence: Secondary | ICD-10-CM | POA: Diagnosis not present

## 2019-07-02 DIAGNOSIS — O26893 Other specified pregnancy related conditions, third trimester: Secondary | ICD-10-CM | POA: Diagnosis present

## 2019-07-02 DIAGNOSIS — O99354 Diseases of the nervous system complicating childbirth: Secondary | ICD-10-CM | POA: Diagnosis not present

## 2019-07-02 DIAGNOSIS — O99320 Drug use complicating pregnancy, unspecified trimester: Secondary | ICD-10-CM | POA: Diagnosis not present

## 2019-07-02 DIAGNOSIS — Z20822 Contact with and (suspected) exposure to covid-19: Secondary | ICD-10-CM | POA: Diagnosis not present

## 2019-07-02 DIAGNOSIS — O99824 Streptococcus B carrier state complicating childbirth: Secondary | ICD-10-CM | POA: Diagnosis not present

## 2019-07-02 DIAGNOSIS — R569 Unspecified convulsions: Secondary | ICD-10-CM | POA: Diagnosis not present

## 2019-07-02 DIAGNOSIS — O34219 Maternal care for unspecified type scar from previous cesarean delivery: Secondary | ICD-10-CM | POA: Diagnosis not present

## 2019-07-02 DIAGNOSIS — E669 Obesity, unspecified: Secondary | ICD-10-CM | POA: Diagnosis not present

## 2019-07-02 DIAGNOSIS — Z302 Encounter for sterilization: Secondary | ICD-10-CM | POA: Diagnosis not present

## 2019-07-02 DIAGNOSIS — Z3A39 39 weeks gestation of pregnancy: Secondary | ICD-10-CM | POA: Diagnosis not present

## 2019-07-02 LAB — RESPIRATORY PANEL BY RT PCR (FLU A&B, COVID)
Influenza A by PCR: NEGATIVE
Influenza B by PCR: NEGATIVE
SARS Coronavirus 2 by RT PCR: NEGATIVE

## 2019-07-02 LAB — TYPE AND SCREEN
ABO/RH(D): B POS
Antibody Screen: NEGATIVE

## 2019-07-02 LAB — RPR: RPR Ser Ql: NONREACTIVE

## 2019-07-02 LAB — ABO/RH: ABO/RH(D): B POS

## 2019-07-02 MED ORDER — PHENYLEPHRINE 40 MCG/ML (10ML) SYRINGE FOR IV PUSH (FOR BLOOD PRESSURE SUPPORT): 80.0000 ug | PREFILLED_SYRINGE | INTRAVENOUS | Status: DC | PRN

## 2019-07-02 MED ORDER — ACETAMINOPHEN 325 MG PO TABS: 650.0000 mg | ORAL_TABLET | ORAL | Status: DC | PRN

## 2019-07-02 MED ORDER — SODIUM CHLORIDE 0.9 % IV SOLN
5.0000 10*6.[IU] | Freq: Once | INTRAVENOUS | Status: AC
  Administered 2019-07-02: 5 10*6.[IU] via INTRAVENOUS
  Filled 2019-07-02: qty 5

## 2019-07-02 MED ORDER — OXYTOCIN 40 UNITS IN NORMAL SALINE INFUSION - SIMPLE MED
2.5000 [IU]/h | INTRAVENOUS | Status: DC
  Administered 2019-07-03: 2.5 [IU]/h via INTRAVENOUS

## 2019-07-02 MED ORDER — LACTATED RINGERS IV SOLN: 500.0000 mL | INTRAVENOUS | Status: DC | PRN

## 2019-07-02 MED ORDER — TERBUTALINE SULFATE 1 MG/ML IJ SOLN: 0.2500 mg | Freq: Once | INTRAMUSCULAR | Status: DC | PRN

## 2019-07-02 MED ORDER — ONDANSETRON HCL 4 MG/2ML IJ SOLN: 4.0000 mg | Freq: Four times a day (QID) | INTRAMUSCULAR | Status: DC | PRN

## 2019-07-02 MED ORDER — PENICILLIN G POT IN DEXTROSE 60000 UNIT/ML IV SOLN
3.0000 10*6.[IU] | INTRAVENOUS | Status: DC
  Administered 2019-07-02 (×4): 3 10*6.[IU] via INTRAVENOUS
  Filled 2019-07-02 (×4): qty 50

## 2019-07-02 MED ORDER — SODIUM CHLORIDE (PF) 0.9 % IJ SOLN
INTRAMUSCULAR | Status: DC | PRN
  Administered 2019-07-02: 12 mL/h via EPIDURAL

## 2019-07-02 MED ORDER — DIPHENHYDRAMINE HCL 50 MG/ML IJ SOLN: 12.5000 mg | INTRAMUSCULAR | Status: DC | PRN

## 2019-07-02 MED ORDER — LACTATED RINGERS IV SOLN
500.0000 mL | Freq: Once | INTRAVENOUS | Status: AC
  Administered 2019-07-02: 22:00:00 500 mL via INTRAVENOUS

## 2019-07-02 MED ORDER — OXYTOCIN 40 UNITS IN NORMAL SALINE INFUSION - SIMPLE MED
1.0000 m[IU]/min | INTRAVENOUS | Status: DC
  Administered 2019-07-02: 07:00:00 2 m[IU]/min via INTRAVENOUS
  Filled 2019-07-02: qty 1000

## 2019-07-02 MED ORDER — LORAZEPAM 2 MG/ML IJ SOLN
INTRAMUSCULAR | Status: AC
  Filled 2019-07-02: qty 1

## 2019-07-02 MED ORDER — FENTANYL-BUPIVACAINE-NACL 0.5-0.125-0.9 MG/250ML-% EP SOLN
12.0000 mL/h | EPIDURAL | Status: DC | PRN
  Filled 2019-07-02: qty 250

## 2019-07-02 MED ORDER — FENTANYL CITRATE (PF) 100 MCG/2ML IJ SOLN
100.0000 ug | INTRAMUSCULAR | Status: DC | PRN
  Administered 2019-07-02: 100 ug via INTRAVENOUS
  Filled 2019-07-02: qty 2

## 2019-07-02 MED ORDER — LORAZEPAM 2 MG/ML IJ SOLN: 1.0000 mg | Freq: Once | INTRAMUSCULAR | Status: AC

## 2019-07-02 MED ORDER — PHENYLEPHRINE 40 MCG/ML (10ML) SYRINGE FOR IV PUSH (FOR BLOOD PRESSURE SUPPORT)
80.0000 ug | PREFILLED_SYRINGE | INTRAVENOUS | Status: DC | PRN
  Filled 2019-07-02: qty 10

## 2019-07-02 MED ORDER — LORAZEPAM 2 MG/ML IJ SOLN: 1.0000 mg | INTRAMUSCULAR | Status: DC | PRN

## 2019-07-02 MED ORDER — SOD CITRATE-CITRIC ACID 500-334 MG/5ML PO SOLN: 30.0000 mL | ORAL | Status: DC | PRN

## 2019-07-02 MED ORDER — OXYCODONE-ACETAMINOPHEN 5-325 MG PO TABS: 1.0000 | ORAL_TABLET | ORAL | Status: DC | PRN

## 2019-07-02 MED ORDER — OXYTOCIN BOLUS FROM INFUSION
500.0000 mL | Freq: Once | INTRAVENOUS | Status: AC
  Administered 2019-07-03: 03:00:00 500 mL via INTRAVENOUS

## 2019-07-02 MED ORDER — LIDOCAINE HCL (PF) 1 % IJ SOLN
INTRAMUSCULAR | Status: DC | PRN
  Administered 2019-07-02: 5 mL via EPIDURAL

## 2019-07-02 MED ORDER — FLEET ENEMA 7-19 GM/118ML RE ENEM: 1.0000 | ENEMA | RECTAL | Status: DC | PRN

## 2019-07-02 MED ORDER — EPHEDRINE 5 MG/ML INJ: 10.0000 mg | INTRAVENOUS | Status: DC | PRN

## 2019-07-02 MED ORDER — LACTATED RINGERS IV SOLN: INTRAVENOUS | Status: DC

## 2019-07-02 MED ORDER — LIDOCAINE HCL (PF) 1 % IJ SOLN: 30.0000 mL | INTRAMUSCULAR | Status: DC | PRN

## 2019-07-02 MED ORDER — OXYCODONE-ACETAMINOPHEN 5-325 MG PO TABS: 2.0000 | ORAL_TABLET | ORAL | Status: DC | PRN

## 2019-07-02 NOTE — Progress Notes (Signed)
Labor Progress Note Natalie Lawrence is a 29 y.o. [phone] at [redacted]w[redacted]d presented for SOL  S: Pt groggy but denies pain  O:  BP (!) 90/57   Pulse 83   Temp 98 F (36.7 C) (Oral)   Resp 18   Ht 5\' 6"  (1.676 m)   Wt 109.8 kg   LMP 05/12/2018   SpO2 98%   BMI 39.06 kg/m  EFM: 140 bpm, moderate variability, no accels, no decels  CVE: Dilation: 3.5 Effacement (%): 50, 60 Cervical Position: Posterior Station: -3 Presentation: Vertex Exam by:: Dr. [email or web address] RN   A&P: 29 y.o. 26 [redacted]w[redacted]d her for TOLAC for SOL #Labor: Not much change since admission, so will start low dose pitocin.  #Pain: As patient requests, likely epidural #FWB: Cat 1, 76% EFW #GBS positive, PCN started @ 0211 #seizure disorder: s/p Seizure at 0400 with 1mg  Ativan given. Hx of seizures only when sleeping. Wakes up with bloody tongue and headaches. According to neurology note and confirmed by patient, pt supposed to be on Keppra 1000mg  BID but does not want to take medication and does not want to start medication after birth. According to neuro note, patient was counseled on risk of abruption if she has a seizure during delivery. On admission, patient confirmed she knows risks and wants to continue with TOLAC.   0212, DO 6:45 AM
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
## 2019-07-02 NOTE — Progress Notes (Addendum)
Responded to room for duress alert. Patient seizing upon entry. Ativan 1 mg given. Patient supported with O2 as O2 sat 91% at the time. Fetal tracing with small decel but otherwise reactive and reassuring. Can give another 1 mg Ativan PRN for seizures. Currently in post-ictal state.  Jerilynn Birkenhead, MD [facility] Fellow, [facility] for [employer], [facility]

## 2019-07-02 NOTE — Progress Notes (Addendum)
Labor Progress Note Natalie Lawrence is a 29 y.o. [id number] at [redacted]w[redacted]d presented for SOL. Pt scheduled for induction   S: Patient resting comfortably.   O:  BP 108/65   Pulse 78   Temp 98.5 F (36.9 C) (Oral)   Resp 16   Ht 5\' 6"  (1.676 m)   Wt 109.8 kg   LMP 05/12/2018   SpO2 98%   BMI 39.06 kg/m  EFM: 130 / moderate variability / pos accels, no decels  TOCO: irregular  CVE: deferred   A&P: 29 y.o. 26 [redacted]w[redacted]d her for TOLAC for SOL #Labor: Continue pitocin and labor management. Anticipate VBAC.  #Pain: per patient request  #FWB: Cat 1  #GBS positive, PCN started @ 0211 #seizure disorder: s/p Seizure at 0400 with 1mg  Ativan given. Hx of seizures when sleeping. Pt not taking Wakes up with bloody tongue and headaches. To give 1-2mg  Ativan prn for seizures. Patient not taking Keppra, per neurology.      0212, DO 10:39 AM

## 2019-07-02 NOTE — Progress Notes (Signed)
Labor Progress Note Natalie Lawrence is a 29 y.o. [id number] at [redacted]w[redacted]d presented for SOL.   S: Patient doing well.  Endorses a slight headache that is resolving s/p seizure.   O:  BP 101/70   Pulse 88   Temp 98 F (36.7 C) (Oral)   Resp 16   Ht 5\' 6"  (1.676 m)   Wt 109.8 kg   LMP 05/12/2018   SpO2 98%   BMI 39.06 kg/m  EFM: 140 / moderate variability / pos accels, no decels  TOCO: every 2-4 minutes   Dilation: 3.5 Effacement (%): 60 Cervical Position: Posterior Station: -2 Presentation: Vertex Exam by:: bhambri,cnm    A&P: 29 y.o. 26 [redacted]w[redacted]d her for TOLAC for SOL #Labor: Continue pitocin and labor management. Anticipate VBAC.  #Pain: per patient request  #FWB: Cat 1  #GBS positive, PCN started @ 0211 #seizure disorder: Patient had a seizure at 0400 today and was given 1mg  Ativan. Known hx of seizures when sleeping however is not taking her antiepileptic medication. Patient states she has help at home and is rarely alone with her children. Will give 1-2mg  Ativan prn for seizure activity.    [name], DO 1:48 PM

## 2019-07-02 NOTE — Progress Notes (Addendum)
Patient ID: Natalie Lawrence, female   DOB: 1990-09-04, 28 y.o.   MRN: [id number] Natalie Lawrence is a 29 y.o. [id number] at [redacted]w[redacted]d by ultrasound admitted for active labor  Subjective: No pain with contractions. No prodromal symptoms or recent seizure activity.  Objective: BP 96/62   Pulse 82   Temp 98.2 F (36.8 C) (Oral)   Resp 16   Ht 5\' 6"  (1.676 m)   Wt 109.8 kg   LMP 05/12/2018   SpO2 98%   BMI 39.06 kg/m  No intake/output data recorded. No intake/output data recorded.  FHT:  FHR: 145 bpm, variability: moderate,  accelerations:  Present,  decelerations:  Absent UC:   irregular, every 2-3 minutes SVE:   Dilation: 5 Effacement (%): 80 Station: -2 Exam by:: Dr. [email or web address]  Labs: Lab Results  Component Value Date   WBC 13.4 (H) 07/01/2019   HGB 12.0 07/01/2019   HCT 37.7 07/01/2019   MCV 87.5 07/01/2019   PLT 187 07/01/2019     A&P:  28 y.o. 07/03/2019 [redacted]w[redacted]d admitted for SOL, TOLAC.   #Labor: Pitocin started at 0654. Now at 15. AROM @ 2200 with clear fluid. Consider IUPC at next check if no change.  #Pain: per patient request   #FWB: Cat 1  #GBS positive, PCN  #seizure disorder: Patient had a seizure at 0400 on 4/29 and was given 1mg  Ativan. Known hx of seizures when sleeping however is not taking her antiepileptic medication. Patient states she has help at home and is rarely alone with her children. Will give 1-2mg  Ativan prn for seizure activity.   5/29 07/02/2019, 10:05 PM

## 2019-07-02 NOTE — Anesthesia Preprocedure Evaluation (Addendum)
Anesthesia Evaluation  Patient identified by MRN, date of birth, ID band Patient awake    Reviewed: Allergy & Precautions, NPO status , Patient's Chart, lab work & pertinent test results  Airway Mallampati: II  TM Distance: >3 FB Neck ROM: Full    Dental no notable dental hx. (+) Teeth Intact   Pulmonary former smoker,    Pulmonary exam normal breath sounds clear to auscultation       Cardiovascular negative cardio ROS Normal cardiovascular exam Rhythm:Regular Rate:Normal     Neuro/Psych Seizures -, Poorly Controlled,  Last one this AM received Ativan negative psych ROS   GI/Hepatic negative GI ROS, Neg liver ROS,   Endo/Other  negative endocrine ROS  Renal/GU negative Renal ROS     Musculoskeletal negative musculoskeletal ROS (+)   Abdominal (+) + obese,   Peds  Hematology Lab Results      Component                Value               Date                      WBC                      13.4 (H)            07/01/2019                HGB                      12.0                07/01/2019                HCT                      37.7                07/01/2019                MCV                      87.5                07/01/2019                PLT                      187                 07/01/2019              Anesthesia Other Findings   Reproductive/Obstetrics (+) Pregnancy                             Anesthesia Physical Anesthesia Plan  ASA: III  Anesthesia Plan: Epidural   Post-op Pain Management:    Induction:   PONV Risk Score and Plan:   Airway Management Planned:   Additional Equipment:   Intra-op Plan:   Post-operative Plan:   Informed Consent:   Plan Discussed with:   Anesthesia Plan Comments: (39.2 Wk G4p3 w hx of Sz disorder ( last one this AM) for VBAC attempt for LEA)        Anesthesia Quick Evaluation

## 2019-07-02 NOTE — Progress Notes (Signed)
Labor Progress Note Natalie Lawrence is a 29 y.o. [id number] at [redacted]w[redacted]d presented for SOL.   S: Comfortable. Denies headache and blurry vision.  Has no complaints at this time.   O:  BP 116/78   Pulse 79   Temp 98.4 F (36.9 C) (Oral)   Resp 16   Ht 5\' 6"  (1.676 m)   Wt 109.8 kg   LMP 05/12/2018   SpO2 98%   BMI 39.06 kg/m  EFM: 135 / mod variability / pos accels, no decels  TOCO: every 2-4 minutes   Dilation: 3.5 Effacement (%): 60 Cervical Position: Posterior Station: -2 Presentation: Vertex Exam by:: bhambri,cnm    A&P: 29 y.o. 26 [redacted]w[redacted]d her for TOLAC for SOL #Labor: Continue pitocin.  Consider pitocin break ~1900 or AROM.   #Pain: per patient request  #FWB: Cat 1  #GBS positive, PCN started @ 0211 #seizure disorder: Patient had a seizure at 0400 today and was given 1mg  Ativan. Known hx of seizures when sleeping however is not taking her antiepileptic medication. Patient states she has help at home and is rarely alone with her children. Will give 1-2mg  Ativan prn for seizure activity.    0212, DO 6:32 PM

## 2019-07-02 NOTE — Anesthesia Procedure Notes (Signed)
Epidural Patient location during procedure: OB Start time: 07/02/2019 10:14 PM End time: 07/02/2019 10:30 PM  Staffing Anesthesiologist: Trevor Iha, MD Performed: anesthesiologist   Preanesthetic Checklist Completed: patient identified, IV checked, site marked, risks and benefits discussed, surgical consent, monitors and equipment checked, pre-op evaluation and timeout performed  Epidural Patient position: sitting Prep: DuraPrep and site prepped and draped Patient monitoring: continuous pulse ox and blood pressure Approach: midline Location: L3-L4 Injection technique: LOR air  Needle:  Needle type: Tuohy  Needle gauge: 17 G Needle length: 9 cm and 9 Needle insertion depth: 9 cm Catheter type: closed end flexible Catheter size: 19 Gauge Catheter at skin depth: 14 cm Test dose: negative  Assessment Events: blood not aspirated, injection not painful, no injection resistance, no paresthesia and negative IV test  Additional Notes Patient identified. Risks/Benefits/Options discussed with patient including but not limited to bleeding, infection, nerve damage, paralysis, failed block, incomplete pain control, headache, blood pressure changes, nausea, vomiting, reactions to medication both or allergic, itching and postpartum back pain. Confirmed with bedside nurse the patient's most recent platelet count. Confirmed with patient that they are not currently taking any anticoagulation, have any bleeding history or any family history of bleeding disorders. Patient expressed understanding and wished to proceed. All questions were answered. Sterile technique was used throughout the entire procedure. Please see nursing notes for vital signs. Test dose was given through epidural needle and negative prior to continuing to dose epidural or start infusion. Warning signs of high block given to the patient including shortness of breath, tingling/numbness in hands, complete motor block, or any  concerning symptoms with instructions to call for help. Patient was given instructions on fall risk and not to get out of bed. All questions and concerns addressed with instructions to call with any issues.  1 Attempt (S) . Patient tolerated procedure well.

## 2019-07-03 ENCOUNTER — Inpatient Hospital Stay (HOSPITAL_COMMUNITY): Payer: Medicaid Other | Admitting: Anesthesiology

## 2019-07-03 ENCOUNTER — Encounter (HOSPITAL_COMMUNITY)
Admission: AD | Disposition: A | Discharge status: Home / Self Care | Payer: Self-pay | Source: Home / Self Care | Attending: Obstetrics and Gynecology

## 2019-07-03 ENCOUNTER — Encounter (HOSPITAL_COMMUNITY): Payer: Self-pay | Admitting: Obstetrics and Gynecology

## 2019-07-03 DIAGNOSIS — Z3A39 39 weeks gestation of pregnancy: Secondary | ICD-10-CM

## 2019-07-03 DIAGNOSIS — Z9851 Tubal ligation status: Secondary | ICD-10-CM

## 2019-07-03 DIAGNOSIS — O99354 Diseases of the nervous system complicating childbirth: Secondary | ICD-10-CM

## 2019-07-03 DIAGNOSIS — R569 Unspecified convulsions: Secondary | ICD-10-CM

## 2019-07-03 DIAGNOSIS — Z98891 History of uterine scar from previous surgery: Secondary | ICD-10-CM

## 2019-07-03 DIAGNOSIS — F1211 Cannabis abuse, in remission: Secondary | ICD-10-CM

## 2019-07-03 DIAGNOSIS — Z302 Encounter for sterilization: Secondary | ICD-10-CM

## 2019-07-03 DIAGNOSIS — O99320 Drug use complicating pregnancy, unspecified trimester: Secondary | ICD-10-CM

## 2019-07-03 HISTORY — PX: TUBAL LIGATION: SHX77

## 2019-07-03 LAB — CBC
HCT: 35.5 % — ABNORMAL LOW (ref 36.0–46.0)
Hemoglobin: 11 g/dL — ABNORMAL LOW (ref 12.0–15.0)
MCH: 28.6 pg (ref 26.0–34.0)
MCHC: 31 g/dL (ref 30.0–36.0)
MCV: 92.4 fL (ref 80.0–100.0)
Platelets: 168 10*3/uL (ref 150–400)
RBC: 3.84 MIL/uL — ABNORMAL LOW (ref 3.87–5.11)
RDW: 13.5 % (ref 11.5–15.5)
WBC: 15.9 10*3/uL — ABNORMAL HIGH (ref 4.0–10.5)
nRBC: 0 % (ref 0.0–0.2)

## 2019-07-03 SURGERY — LIGATION, FALLOPIAN TUBE, POSTPARTUM
Anesthesia: Epidural | Site: Abdomen | Laterality: Bilateral | Wound class: Clean Contaminated

## 2019-07-03 MED ORDER — PRENATAL MULTIVITAMIN CH
1.0000 | ORAL_TABLET | Freq: Every day | ORAL | Status: DC
  Administered 2019-07-04: 1 via ORAL
  Filled 2019-07-03: qty 1

## 2019-07-03 MED ORDER — LACTATED RINGERS IV SOLN: INTRAVENOUS | Status: DC

## 2019-07-03 MED ORDER — PROMETHAZINE HCL 25 MG/ML IJ SOLN: 6.2500 mg | INTRAMUSCULAR | Status: DC | PRN

## 2019-07-03 MED ORDER — MIDAZOLAM HCL 5 MG/5ML IJ SOLN
INTRAMUSCULAR | Status: DC | PRN
  Administered 2019-07-03 (×2): 1 mg via INTRAVENOUS

## 2019-07-03 MED ORDER — KETOROLAC TROMETHAMINE 30 MG/ML IJ SOLN: 30.0000 mg | Freq: Once | INTRAMUSCULAR | Status: DC | PRN

## 2019-07-03 MED ORDER — ACETAMINOPHEN 325 MG PO TABS: 650.0000 mg | ORAL_TABLET | ORAL | Status: DC | PRN

## 2019-07-03 MED ORDER — KETOROLAC TROMETHAMINE 30 MG/ML IJ SOLN
INTRAMUSCULAR | Status: AC
  Filled 2019-07-03: qty 1

## 2019-07-03 MED ORDER — OXYCODONE HCL 5 MG PO TABS: 10.0000 mg | ORAL_TABLET | ORAL | Status: DC | PRN

## 2019-07-03 MED ORDER — FAMOTIDINE 20 MG PO TABS
40.0000 mg | ORAL_TABLET | Freq: Once | ORAL | Status: AC
  Administered 2019-07-03: 40 mg via ORAL
  Filled 2019-07-03: qty 2

## 2019-07-03 MED ORDER — FENTANYL CITRATE (PF) 100 MCG/2ML IJ SOLN
INTRAMUSCULAR | Status: DC | PRN
  Administered 2019-07-03 (×2): 50 ug via INTRAVENOUS

## 2019-07-03 MED ORDER — MIDAZOLAM HCL 2 MG/2ML IJ SOLN
INTRAMUSCULAR | Status: AC
  Filled 2019-07-03: qty 2

## 2019-07-03 MED ORDER — WITCH HAZEL-GLYCERIN EX PADS: 1.0000 "application " | MEDICATED_PAD | CUTANEOUS | Status: DC | PRN

## 2019-07-03 MED ORDER — SODIUM BICARBONATE 8.4 % IV SOLN
INTRAVENOUS | Status: AC
  Filled 2019-07-03: qty 50

## 2019-07-03 MED ORDER — ONDANSETRON HCL 4 MG/2ML IJ SOLN
INTRAMUSCULAR | Status: DC | PRN
  Administered 2019-07-03: 4 mg via INTRAVENOUS

## 2019-07-03 MED ORDER — ONDANSETRON HCL 4 MG PO TABS: 4.0000 mg | ORAL_TABLET | ORAL | Status: DC | PRN

## 2019-07-03 MED ORDER — LIDOCAINE-EPINEPHRINE (PF) 2 %-1:200000 IJ SOLN
INTRAMUSCULAR | Status: AC
  Filled 2019-07-03: qty 10

## 2019-07-03 MED ORDER — KETOROLAC TROMETHAMINE 30 MG/ML IJ SOLN
INTRAMUSCULAR | Status: DC | PRN
  Administered 2019-07-03: 30 mg via INTRAVENOUS

## 2019-07-03 MED ORDER — ZOLPIDEM TARTRATE 5 MG PO TABS: 5.0000 mg | ORAL_TABLET | Freq: Every evening | ORAL | Status: DC | PRN

## 2019-07-03 MED ORDER — HYDROMORPHONE HCL 1 MG/ML IJ SOLN: 0.2500 mg | INTRAMUSCULAR | Status: DC | PRN

## 2019-07-03 MED ORDER — ONDANSETRON HCL 4 MG/2ML IJ SOLN: 4.0000 mg | INTRAMUSCULAR | Status: DC | PRN

## 2019-07-03 MED ORDER — MEPERIDINE HCL 25 MG/ML IJ SOLN: 6.2500 mg | INTRAMUSCULAR | Status: DC | PRN

## 2019-07-03 MED ORDER — FENTANYL CITRATE (PF) 100 MCG/2ML IJ SOLN
INTRAMUSCULAR | Status: AC
  Filled 2019-07-03: qty 2

## 2019-07-03 MED ORDER — COCONUT OIL OIL: 1.0000 "application " | TOPICAL_OIL | Status: DC | PRN

## 2019-07-03 MED ORDER — LACTATED RINGERS IV SOLN: INTRAVENOUS | Status: DC | PRN

## 2019-07-03 MED ORDER — SODIUM BICARBONATE 8.4 % IV SOLN
INTRAVENOUS | Status: DC | PRN
  Administered 2019-07-03 (×2): 7 mL via EPIDURAL
  Administered 2019-07-03: 1 mL via EPIDURAL
  Administered 2019-07-03: 3 mL via EPIDURAL

## 2019-07-03 MED ORDER — BUPIVACAINE HCL (PF) 0.25 % IJ SOLN
INTRAMUSCULAR | Status: AC
  Filled 2019-07-03: qty 20

## 2019-07-03 MED ORDER — OXYCODONE HCL 5 MG PO TABS
5.0000 mg | ORAL_TABLET | ORAL | Status: DC | PRN
  Administered 2019-07-03: 5 mg via ORAL
  Filled 2019-07-03 (×2): qty 1

## 2019-07-03 MED ORDER — BENZOCAINE-MENTHOL 20-0.5 % EX AERO: 1.0000 "application " | INHALATION_SPRAY | CUTANEOUS | Status: DC | PRN

## 2019-07-03 MED ORDER — IBUPROFEN 600 MG PO TABS
600.0000 mg | ORAL_TABLET | Freq: Four times a day (QID) | ORAL | Status: DC
  Administered 2019-07-03 – 2019-07-04 (×3): 600 mg via ORAL
  Filled 2019-07-03 (×4): qty 1

## 2019-07-03 MED ORDER — TETANUS-DIPHTH-ACELL PERTUSSIS 5-2.5-18.5 LF-MCG/0.5 IM SUSP: 0.5000 mL | Freq: Once | INTRAMUSCULAR | Status: DC

## 2019-07-03 MED ORDER — SENNOSIDES-DOCUSATE SODIUM 8.6-50 MG PO TABS
2.0000 | ORAL_TABLET | ORAL | Status: DC
  Administered 2019-07-03: 2 via ORAL
  Filled 2019-07-03: qty 2

## 2019-07-03 MED ORDER — METOCLOPRAMIDE HCL 10 MG PO TABS
10.0000 mg | ORAL_TABLET | Freq: Once | ORAL | Status: AC
  Administered 2019-07-03: 10 mg via ORAL
  Filled 2019-07-03: qty 1

## 2019-07-03 MED ORDER — DIPHENHYDRAMINE HCL 25 MG PO CAPS: 25.0000 mg | ORAL_CAPSULE | Freq: Four times a day (QID) | ORAL | Status: DC | PRN

## 2019-07-03 MED ORDER — SIMETHICONE 80 MG PO CHEW: 80.0000 mg | CHEWABLE_TABLET | ORAL | Status: DC | PRN

## 2019-07-03 MED ORDER — DIBUCAINE (PERIANAL) 1 % EX OINT: 1.0000 "application " | TOPICAL_OINTMENT | CUTANEOUS | Status: DC | PRN

## 2019-07-03 MED ORDER — BUPIVACAINE HCL (PF) 0.25 % IJ SOLN
INTRAMUSCULAR | Status: DC | PRN
  Administered 2019-07-03: 20 mL

## 2019-07-03 MED ORDER — SODIUM CHLORIDE 0.9 % IR SOLN
Status: DC | PRN
  Administered 2019-07-03: 1

## 2019-07-03 MED ORDER — ONDANSETRON HCL 4 MG/2ML IJ SOLN
INTRAMUSCULAR | Status: AC
  Filled 2019-07-03: qty 2

## 2019-07-03 SURGICAL SUPPLY — 22 items
CLOTH BEACON ORANGE TIMEOUT ST (SAFETY) ×2 IMPLANT
DRSG OPSITE POSTOP 3X4 (GAUZE/BANDAGES/DRESSINGS) ×2 IMPLANT
DRSG OPSITE POSTOP 4X10 (GAUZE/BANDAGES/DRESSINGS) ×2 IMPLANT
DURAPREP 26ML APPLICATOR (WOUND CARE) ×2 IMPLANT
GLOVE BIO SURGEON STRL SZ7.5 (GLOVE) ×2 IMPLANT
GLOVE BIOGEL PI IND STRL 7.0 (GLOVE) ×2 IMPLANT
GLOVE BIOGEL PI INDICATOR 7.0 (GLOVE) ×2
GOWN STRL REUS W/TWL LRG LVL3 (GOWN DISPOSABLE) ×2 IMPLANT
GOWN STRL REUS W/TWL XL LVL3 (GOWN DISPOSABLE) ×2 IMPLANT
NEEDLE HYPO 22GX1.5 SAFETY (NEEDLE) ×2 IMPLANT
NS IRRIG 1000ML POUR BTL (IV SOLUTION) ×2 IMPLANT
PACK ABDOMINAL MINOR (CUSTOM PROCEDURE TRAY) ×2 IMPLANT
PROTECTOR NERVE ULNAR (MISCELLANEOUS) ×2 IMPLANT
SPONGE LAP 4X18 RFD (DISPOSABLE) IMPLANT
SUT MNCRL AB 4-0 PS2 18 (SUTURE) ×2 IMPLANT
SUT PLAIN 0 NONE (SUTURE) ×2 IMPLANT
SUT VIC AB 0 CT1 27 (SUTURE) ×1
SUT VIC AB 0 CT1 27XBRD ANBCTR (SUTURE) ×1 IMPLANT
SYR CONTROL 10ML LL (SYRINGE) ×2 IMPLANT
TOWEL OR 17X24 6PK STRL BLUE (TOWEL DISPOSABLE) ×4 IMPLANT
TRAY FOLEY CATH SILVER 14FR (SET/KITS/TRAYS/PACK) ×2 IMPLANT
WATER STERILE IRR 1000ML POUR (IV SOLUTION) ×2 IMPLANT

## 2019-07-03 NOTE — Op Note (Signed)
Operative Note   07/03/2019  PRE-OP DIAGNOSIS: Desire for permanent sterilization.  Postpartum Day #0   POST-OP DIAGNOSIS: Bilateral tubal ligation via Modified Pomeroy method  SURGEON: Surgeon(s) and Role:    * Nettie Elm, MD - Primary  ASSISTANT: Venora Maples, MD/[facility] - Fellow  ANESTHESIA: Epidural and local  PROCEDURE: mini-laparotomy, bilateral tubal ligation via Modified Pomeroy  ESTIMATED BLOOD LOSS: <10 mL  DRAINS: Foley catheter drained 100 mL of clear urine  TOTAL IV FLUIDS: 0 mL crystalloid  SPECIMENS:  Portions of bilateral tubes to pathology  VTE PROPHYLAXIS: SCDs to the bilateral lower extremities  ANTIBIOTICS: not indicated  COMPLICATIONS: none  DISPOSITION: PACU - hemodynamically stable.  CONDITION: stable  FINDINGS: No intra-abdominal adhesions noted. Smooth, normally contoured uterine fundus and bilateral tubes. Normal appearing tubes and normal ovaries on palpation. +lumens noted on each cross section  PROCEDURE IN DETAIL: The patient was taken to the OR where anesthesia was administed. The patient was positioned in dorsal supine. The patient was prepped and draped in the normal sterile fashion a 4cm horizontal incision was made approximately 2 finger breadths below the umbilicus. The skin was then incised with the scalpel and the underlying tissue dissected with the bovie and the fascia nicked in the midline with the scalpel and then extended laterally sharply.   Pomeroy:The left Fallopian tube was palpated and then traced to it's fimbriae and an avascular midsection of the tube approximately 3-4cm from the cornua was grasped with the Babcock clamps and brought into a knuckle. The tube was double ligated with one 3-0 plain gut suture and the intervening portion of tube was transected and removed; prior to removal, another free tie of 3-0 plain gut was tied below the first suture.  Attention was then turned to the right fallopian tube, after  confirmation of identification by tracing the tube out to the fimbriae. The same procedure was then performed on the right Fallopian tube.   The peritoneum was closed in a purse string fashion with 0 Vicryl and then the fascia closed in running fashion with 0 vicryl. The skin was then closed with 4-0 monocryl.   The patient tolerated the procedure well. All counts were correct x 2. The patient was transferred to the recovery room awake, alert and breathing independently.  Zack Seal, MD/[facility] Fellow  Center for [employer] Midwife)

## 2019-07-03 NOTE — Anesthesia Postprocedure Evaluation (Signed)
Anesthesia Post Note  Patient: Natalie Lawrence  Procedure(s) Performed: AN AD HOC LABOR EPIDURAL     Patient location during evaluation: Mother Baby Anesthesia Type: Epidural Level of consciousness: awake and alert, oriented and patient cooperative Pain management: pain level controlled Vital Signs Assessment: post-procedure vital signs reviewed and stable Respiratory status: spontaneous breathing Cardiovascular status: stable Postop Assessment: no headache, epidural receding, patient able to bend at knees and no signs of nausea or vomiting Anesthetic complications: no Comments: Pain score 0.     Last Vitals:  Vitals:   07/03/19 0459 07/03/19 0605  BP: 115/81 111/64  Pulse: 60 66  Resp: 18 19  Temp: 36.5 C 36.6 C  SpO2: 100% 100%    Last Pain:  Vitals:   07/03/19 0605  TempSrc: Oral  PainSc: 0-No pain   Pain Goal:                   [facility]

## 2019-07-03 NOTE — Transfer of Care (Signed)
Immediate Anesthesia Transfer of Care Note  Patient: Natalie Lawrence  Procedure(s) Performed: POST PARTUM TUBAL LIGATION (Bilateral Abdomen)  Patient Location: PACU  Anesthesia Type:Epidural  Level of Consciousness: awake, alert  and oriented  Airway & Oxygen Therapy: Patient Spontanous Breathing  Post-op Assessment: Report given to RN and Post -op Vital signs reviewed and stable  Post vital signs: Reviewed and stable  Last Vitals:  Vitals Value Taken Time  BP 100/65 07/03/19 1312  Temp    Pulse 66 07/03/19 1314  Resp 14 07/03/19 1314  SpO2 97 % 07/03/19 1314  Vitals shown include unvalidated device data.  Last Pain:  Vitals:   07/03/19 1053  TempSrc:   PainSc: 0-No pain         Complications: No apparent anesthesia complications

## 2019-07-03 NOTE — Progress Notes (Addendum)
Patient ID: Natalie Lawrence, female   DOB: [birth year], 29 y.o.   MRN: [id number] Labor Progress Note Natalie Lawrence is a 29 y.o. [id number] at [redacted]w[redacted]d presented for SOL.  S:  Comfortable with epidural, no complaints at this time.   O:  BP 104/77   Pulse 88   Temp 98.2 F (36.8 C) (Oral)   Resp 16   Ht 5\' 6"  (1.676 m)   Wt 109.8 kg   LMP 05/12/2018   SpO2 98%   BMI 39.06 kg/m  EFM: 120/moderate/repetitive variable decels present  CVE: Dilation: Lip/rim Effacement (%): 100 Cervical Position: Anterior Station: 0 Presentation: Vertex Exam by:: Dr. [email or web address]   A&P:  29 y.o. 26 [redacted]w[redacted]d admitted for SOL, TOLAC.  #Labor: Pitocin started at 0654 on 4/29. Now at 15. AROM @ 2200 with clear fluid. Recheck in one hour.  #Pain: Epidural in place.    #FWB: Cat 2. Reassuring for moderate variability, presence of accels, positive scalp stim, and prompt return to baseline.  #GBS positive, PCN  #Seizure disorder: Patient had a seizure at 0400 on 4/29 and was given 1mg  Ativan. Known hx of seizures when sleeping however is not taking her antiepileptic medication. Patient states she has help at home and is rarely alone with her children. Will give 1-2mg  Ativan prn for seizure activity.   5/29, Medical Student 1:32 AM  GME ATTESTATION:  I saw and evaluated the patient. I agree with the findings and the plan of care as documented in the student's note.  , DO OB Fellow, Faculty [facility], Center for [facility] 07/03/2019 1:52 AM

## 2019-07-03 NOTE — Discharge Summary (Signed)
Postpartum Discharge Summary      Patient Name: Natalie Lawrence DOB: August 17, 1990 MRN: [id number]  Date of admission: 07/01/2019 Delivering Provider: Merilyn Baba   Date of discharge: 07/04/2019  Admitting diagnosis: Normal labor [O80, Z37.9] Intrauterine pregnancy: [redacted]w[redacted]d    Secondary diagnosis:  Active Problems:   Obesity affecting pregnancy in first trimester   [place]   History of VBAC   Marijuana use   BMI 40.0-44.9, adult (HCC)   GBS (group B Streptococcus carrier), +RV culture, currently pregnant   Normal labor   History of bilateral tubal ligation  Additional problems: H/o Cesarean delivery     Discharge diagnosis: VBAC                                                                                                Post partum procedures:postpartum tubal ligation  Augmentation: AROM and Pitocin  Complications: None  Hospital course:  Onset of Labor With Vaginal Delivery     29y.o. yo [id number] admitted in Latent Labor on 07/01/2019. Patient had an uncomplicated labor course as follows:   Labor was augmented with pitocin. AROM was performed, she progressed to complete and delivered shortly after.  Membrane Rupture Time/Date: 9:55 PM ,07/02/2019   Intrapartum Procedures: Episiotomy: None [1]                                         Lacerations:  Vaginal [6]  Patient had a delivery of a Viable infant. 07/03/2019  Information for the patient's newborn:  MiIline, Buchinger0[[id number]]Delivery Method: Vaginal, Spontaneous(Filed from Delivery Summary)     Pateint had an uncomplicated postpartum course.  She is ambulating, tolerating a regular diet, passing flatus, and urinating well. Patient is discharged home in stable condition on 07/04/19.  Delivery time: 2:36 AM    Magnesium Sulfate received: No BMZ received: No Rhophylac:N/A MMR:N/A Transfusion:No  Physical exam  Vitals:   07/03/19 1411 07/03/19 1935 07/03/19 2329 07/04/19  0328  BP: 109/73 113/76 113/70 106/74  Pulse: 60 (!) 56 61 (!) 51  Resp: 18 18 18 18   Temp: 97.6 F (36.4 C) 98 F (36.7 C) 98.4 F (36.9 C) 98.2 F (36.8 C)  TempSrc: Oral Oral Oral Oral  SpO2:  100%    Weight:      Height:       General: alert, cooperative and no distress Lochia: appropriate Uterine Fundus: firm Incision: Healing well with no significant drainage DVT Evaluation: No evidence of DVT seen on physical exam. Labs: Lab Results  Component Value Date   WBC 15.9 (H) 07/03/2019   HGB 11.0 (L) 07/03/2019   HCT 35.5 (L) 07/03/2019   MCV 92.4 07/03/2019   PLT 168 07/03/2019   CMP Latest Ref Rng & Units 07/01/2019  Glucose 70 - 99 mg/dL 83  BUN 6 - 20 mg/dL 7  Creatinine 0.44 - 1.00 mg/dL 0.66  Sodium 135 - 145 mmol/L 137  Potassium 3.5 - 5.1 mmol/L  3.8  Chloride 98 - 111 mmol/L 108  CO2 22 - 32 mmol/L 17(L)  Calcium 8.9 - 10.3 mg/dL 8.9  Total Protein 6.5 - 8.1 g/dL 6.8  Total Bilirubin 0.3 - 1.2 mg/dL 0.8  Alkaline Phos 38 - 126 U/L 118  AST 15 - 41 U/L 18  ALT 0 - 44 U/L 13   Edinburgh Score: Edinburgh Postnatal Depression Scale Screening Tool 07/03/2019  I have been able to laugh and see the funny side of things. (No Data)    Discharge instruction: per After Visit Summary and "Baby and Me Booklet".  After visit meds:  Allergies as of 07/04/2019   No Known Allergies     Medication List    TAKE these medications   acetaminophen 325 MG tablet Commonly known as: Tylenol Take 2 tablets (650 mg total) by mouth every 4 (four) hours as needed (for pain scale < 4).   Blood Pressure Kit Devi 1 Device by Does not apply route daily. ICD 10: Z34.00   [place] 1 Units by Does not apply route daily as needed.   folic acid 1 MG tablet Commonly known as: FOLVITE Take 1 tablet (1 mg total) by mouth daily.   ibuprofen 600 MG tablet Commonly known as: ADVIL Take 1 tablet (600 mg total) by mouth every 6 (six) hours.   levETIRAcetam  1000 MG tablet Commonly known as: Keppra Take 1 tablet (1,000 mg total) by mouth 2 (two) times daily.   oxyCODONE 5 MG immediate release tablet Commonly known as: Oxy IR/ROXICODONE Take 1 tablet (5 mg total) by mouth every 4 (four) hours as needed (pain scale 4-7).   Prenatabs Rx 29-1 MG Tabs Take 1 tablet by mouth daily.       Diet: routine diet  Activity: Advance as tolerated. Pelvic rest for 6 weeks.   Outpatient follow up:6 weeks Follow up Appt: Future Appointments  Date Time Provider [place]  07/17/2019 10:20 AM [facility]  08/04/2019  8:55 AM Chancy Milroy, MD [facility]  08/19/2019 11:00 AM Melvenia Beam, MD [facility] None  08/28/2019 10:30 AM Cameron Sprang, MD [facility] None   Follow up Visit: Please schedule this patient for Postpartum visit in: 6 weeks with the following provider: Any provider Virtual For C/S patients schedule nurse incision check in weeks 2 weeks: no High risk pregnancy complicated by: history of seizures, h/o Cesarean delivery Delivery mode:  SVD Anticipated Birth Control:  BTL done PP PP Procedures needed: Incision check for PP tubal Schedule Integrated BH visit: no  Newborn Data: Live born female  Birth Weight: 3490 grams APGAR: 31, 9  Newborn Delivery   Birth date/time: 07/03/2019 02:36:00 Delivery type: Vaginal, Spontaneous      Baby Feeding: Breast Disposition:home with mother   07/04/2019 Merilyn Baba, DO

## 2019-07-03 NOTE — Discharge Instructions (Signed)

## 2019-07-03 NOTE — Progress Notes (Signed)
Post Partum Day 1 Subjective: Pt without complaints this morning.Ambulating and voiding without problems. Pain controlled NPO since @ 3 AM for PP BTL  Objective: Blood pressure 111/64, pulse 66, temperature 97.8 F (36.6 C), temperature source Oral, resp. rate 19, height 5\' 6"  (1.676 m), weight 109.8 kg, last menstrual period 05/12/2018, SpO2 100 %, unknown if currently breastfeeding.  Physical Exam:  General: alert Lochia: appropriate Uterine Fundus: firm Incision: healing well DVT Evaluation: No evidence of DVT seen on physical exam.  Recent Labs    07/01/19 2246 07/03/19 0546  HGB 12.0 11.0*  HCT 37.7 35.5*    Assessment/Plan: Stable  Patient desires permanent sterilization.  Other reversible forms of contraception were discussed with patient; she declines all other modalities. Risks of procedure discussed with patient including but not limited to: risk of regret, permanence of method, bleeding, infection, injury to surrounding organs and need for additional procedures.  Failure risk of 1-2 % with increased risk of ectopic gestation if pregnancy occurs was also discussed with patient.  Patient verbalized understanding of these risks and wants to proceed with sterilization.  Written informed consent obtained.  To OR when ready.     LOS: 1 day   07/05/19 07/03/2019, 9:50 AM

## 2019-07-03 NOTE — Anesthesia Preprocedure Evaluation (Signed)
Anesthesia Evaluation  Patient identified by MRN, date of birth, ID band Patient awake    Reviewed: Allergy & Precautions, NPO status , Patient's Chart, lab work & pertinent test results  Airway Mallampati: II  TM Distance: >3 FB Neck ROM: Full    Dental no notable dental hx. (+) Teeth Intact   Pulmonary former smoker,    Pulmonary exam normal breath sounds clear to auscultation       Cardiovascular negative cardio ROS Normal cardiovascular exam Rhythm:Regular Rate:Normal     Neuro/Psych Seizures -, Poorly Controlled,  Last one this AM received Ativan negative psych ROS   GI/Hepatic negative GI ROS, Neg liver ROS,   Endo/Other  negative endocrine ROS  Renal/GU negative Renal ROS     Musculoskeletal negative musculoskeletal ROS (+)   Abdominal (+) + obese,   Peds  Hematology  (+) Blood dyscrasia, anemia , Lab Results      Component                Value               Date                      WBC                      13.4 (H)            07/01/2019                HGB                      12.0                07/01/2019                HCT                      37.7                07/01/2019                MCV                      87.5                07/01/2019                PLT                      187                 07/01/2019              Anesthesia Other Findings   Reproductive/Obstetrics (+) Pregnancy                             Anesthesia Physical  Anesthesia Plan  ASA: III  Anesthesia Plan: Epidural   Post-op Pain Management:    Induction:   PONV Risk Score and Plan: 3 and Ondansetron, Dexamethasone and Scopolamine patch - Pre-op  Airway Management Planned: Nasal Cannula and Natural Airway  Additional Equipment: None  Intra-op Plan:   Post-operative Plan:   Informed Consent: I have reviewed the patients History and Physical, chart, labs and discussed the procedure  including the risks, benefits and alternatives for the proposed anesthesia  with the patient or authorized representative who has indicated his/her understanding and acceptance.       Plan Discussed with: CRNA  Anesthesia Plan Comments:         Anesthesia Quick Evaluation

## 2019-07-04 MED ORDER — OXYCODONE HCL 5 MG PO TABS: 5.0000 mg | ORAL_TABLET | ORAL | 0 refills | Status: DC | PRN

## 2019-07-04 MED ORDER — IBUPROFEN 600 MG PO TABS: 600.0000 mg | ORAL_TABLET | Freq: Four times a day (QID) | ORAL | 0 refills | Status: DC

## 2019-07-04 MED ORDER — ACETAMINOPHEN 325 MG PO TABS: 650.0000 mg | ORAL_TABLET | ORAL | 0 refills | Status: DC | PRN

## 2019-07-04 NOTE — Progress Notes (Signed)
Sitz bath given with instructions.  Patient states she will start using at home.

## 2019-07-04 NOTE — Lactation Note (Signed)
This note was copied from a baby's chart. Lactation Consultation Note  Patient Name: Natalie Lawrence [id number] Date: 07/04/2019 Reason for consult: Follow-up assessment;Term;Infant weight loss;Other (Comment)(6 % weight loss) experienced breast feeder of twins x 6 months and her now 29 year old 2 years. ( pe mom )  Baby is 30 hours / 6 % weight loss  LC reviewed and updated the doc flow sheets per mom.  As LC entered the room , mom  asleep with baby asleep on her chest.  LC gently called moms name and she woke up and was receptive for [facility] to place baby in the  Crib. Per mom needed to get up to the bathroom.  LC offered to check the baby's diaper and it was dry.  Baby rooting and mom willing to try  the football position and baby latched easily on the right breast with depth and increased swallows with compressions.  LC just assisted to position baby. Per mom comfortable.  LC discussed nutritive vs non - nutritive feeding patterns and the importance of hanging out latched. LC stressed the importance of STS feedings.  Mom denies soreness/ sore nipple and engorgement prevention and tx reviewed.  LC provided the [facility] pamphlet with resource phone numbers.  Per mom  has a DEBP - Medela at home.  LC provided a hand pump with #24 F and #27 F.  Per mom feels comfortable with hand expressing.   Maternal Data Has patient been taught Hand Expression?: Yes Does the patient have breastfeeding experience prior to this delivery?: Yes  Feeding Feeding Type: Breast Fed  LATCH Score Latch: Grasps breast easily, tongue down, lips flanged, rhythmical sucking.  Audible Swallowing: Spontaneous and intermittent  Type of Nipple: Everted at rest and after stimulation  Comfort (Breast/Nipple): Soft / non-tender  Hold (Positioning): Assistance needed to correctly position infant at breast and maintain latch.  LATCH Score: 9  Interventions Interventions: Breast feeding basics reviewed;Assisted with  latch;Skin to skin;Breast massage;Breast compression;Adjust position;Support pillows;Position options;Hand pump  Lactation Tools Discussed/Used Tools: Pump Breast pump type: Manual WIC Program: Yes Pump Review: Milk Storage;Setup, frequency, and cleaning Initiated by:: MAI Date initiated:: 07/04/19   Consult Status Consult Status: Complete    [name] [name] 07/04/2019, 9:16 AM

## 2019-07-04 NOTE — Progress Notes (Addendum)
Patient ID: Natalie Lawrence, female   DOB: 11/20/1990, 29 y.o.   MRN: [id number] Post Partum Day 1 Subjective: no complaints, up ad lib, voiding and tolerating PO, no flatus, given stool softener  Objective: Blood pressure 106/74, pulse (!) 51, temperature 98.2 F (36.8 C), temperature source Oral, resp. rate 18, height 5\' 6"  (1.676 m), weight 109.8 kg, last menstrual period 05/12/2018, SpO2 100 %, unknown if currently breastfeeding.  Physical Exam:  General: alert, cooperative and no distress Lochia: appropriate Uterine Fundus: firm DVT Evaluation: No evidence of DVT seen on physical exam.  Recent Labs    07/01/19 2246 07/03/19 0546  HGB 12.0 11.0*  HCT 37.7 35.5*    Assessment/Plan: Discharge home, Breastfeeding and Contraception (BTL-done)   LOS: 2 days   07/05/19 07/04/2019, 5:57 AM   GME ATTESTATION:  I saw and evaluated the patient. I agree with the findings and the plan of care as documented in the student's note. DC to home, please see DC summary for details.  09/03/2019, DO OB Fellow, Faculty [facility], Center for [facility] 07/04/2019 8:55 AM
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
## 2019-07-04 NOTE — Anesthesia Postprocedure Evaluation (Signed)
Anesthesia Post Note  Patient: Natalie Lawrence  Procedure(s) Performed: POST PARTUM TUBAL LIGATION (Bilateral Abdomen)     Patient location during evaluation: Mother Baby Anesthesia Type: Epidural Level of consciousness: awake and alert Pain management: pain level controlled Vital Signs Assessment: post-procedure vital signs reviewed and stable Respiratory status: spontaneous breathing, nonlabored ventilation and respiratory function stable Cardiovascular status: stable Postop Assessment: no headache, no backache and epidural receding Anesthetic complications: no Comments: Post PPTL.  Patient asleep, talked with RN.  Patient up walking without complaints.    Last Vitals:  Vitals:   07/03/19 2329 07/04/19 0328  BP: 113/70 106/74  Pulse: 61 (!) 51  Resp: 18 18  Temp: 36.9 C 36.8 C  SpO2:      Last Pain:  Vitals:   07/04/19 0730  TempSrc:   PainSc: 0-No pain   Pain Goal:                   Natalie Lawrence N

## 2019-07-06 LAB — SURGICAL PATHOLOGY

## 2019-07-09 ENCOUNTER — Encounter: Payer: Medicaid Other | Admitting: Obstetrics and Gynecology

## 2019-07-09 ENCOUNTER — Telehealth: Payer: Self-pay | Admitting: Neurology

## 2019-07-09 ENCOUNTER — Telehealth: Payer: Self-pay

## 2019-07-09 NOTE — Telephone Encounter (Signed)
Pt would like to discuss Dr Lucia Gaskins approving he for medical marijuana

## 2019-07-09 NOTE — Telephone Encounter (Signed)
Pt called requesting references for therapists.  Message routed to [facility] for advisement.

## 2019-07-09 NOTE — Telephone Encounter (Signed)
I don't think marijuana will help with these kind of seizures so I don;t recommend it. She can go to an epilepsy center if she wants to further purse this thanks

## 2019-07-10 ENCOUNTER — Encounter: Payer: Medicaid Other | Admitting: Family Medicine

## 2019-07-13 NOTE — Telephone Encounter (Signed)
Spoke with pt and advised her of Dr. Trevor Mace message. The pt verbalized understanding and appreciation for the call. Next appt f/u 08/19/19 @ 11:00.

## 2019-07-16 NOTE — BH Specialist Note (Signed)
Integrated Behavioral Health via Telemedicine Phone Visit  07/16/2019 [name] [id number]  Number of Integrated Behavioral Health visits: 1 Session Start time: 3:59  Session End time: 4:49 Total time: 50   Referring Provider: Dewitt Rota, MD Type of Visit: Phone Patient/Family location: Home [facility] Provider location: [facility] for [facility]  All persons participating in visit: Patient Natalie Lawrence and [facility] Qiara Minetti    Confirmed patient's address: Yes  Confirmed patient's phone number: Yes  Any changes to demographics: No   Confirmed patient's insurance: Yes  Any changes to patient's insurance: No   Discussed confidentiality: Yes   I connected with Natalie Lawrence  by a video enabled telemedicine application and verified that I am speaking with the correct person using two identifiers.     I discussed the limitations of evaluation and management by telemedicine and the availability of in person appointments.  I discussed that the purpose of this visit is to provide behavioral health care while limiting exposure to the novel coronavirus.   Discussed there is a possibility of technology failure and discussed alternative modes of communication if that failure occurs.  I discussed that engaging in this virtual visit, they consent to the provision of behavioral healthcare and the services will be billed under their insurance.  Patient and/or legal guardian expressed understanding and consented to virtual visit: Yes   PRESENTING CONCERNS: Patient and/or family reports the following symptoms/concerns: Pt states her primary concern today is stress stemming from childhood and relationship with mother; pt feels she has good support at home postpartum, interested in finding ongoing therapy options. Pt also concerned about a headache lasting the past week (they are more intense post-seizure; had last seizure day before  childbirth).  Duration of problem: Ongoing, with recent headache; Severity of problem: moderate  STRENGTHS (Protective Factors/Coping Skills): Good support at home, resilient  GOALS ADDRESSED: Patient will: 1.  Reduce symptoms of: stress  2.  Increase knowledge and/or ability of: self-management skills  3.  Demonstrate ability to: Increase healthy adjustment to current life circumstances and Increase adequate support systems for patient/family  INTERVENTIONS: Interventions utilized:  Brief CBT, Psychoeducation and/or Health Education and Link to [employer] Standardized Assessments completed: GAD-7 and PHQ 9  ASSESSMENT: Patient currently experiencing Psychosocial stress.   Patient may benefit from psychoeducation and brief therapeutic interventions regarding coping with symptoms of life stress .  PLAN: 1. Follow up with behavioral health clinician on : Two weeks 2. Behavioral recommendations:  -Begin Worry Time strategy daily, starting tomorrow(prioritizing things you can control vs things outside the realm of control) -Consider establishing care with ongoing therapy -Consider attending new mom support group at either [email or web address] or [email or web address], for additional support -Continue taking prenatal vitamin until postpartum visit; discuss with medical provider at that time 3. Referral(s): Integrated Art gallery manager (In Clinic), Community Resources:  New mom support and Counselor  I discussed the assessment and treatment plan with the patient and/or parent/guardian. They were provided an opportunity to ask questions and all were answered. They agreed with the plan and demonstrated an understanding of the instructions.   They were advised to call back or seek an in-person evaluation if the symptoms worsen or if the condition fails to improve as anticipated.  Natalie Lawrence  Depression screen [facility] 2/9 07/21/2019 06/29/2019  Decreased Interest 0 0  Down,  Depressed, Hopeless 0 0  PHQ - 2 Score 0 0  Altered sleeping 0 0  Tired,  decreased energy 0 0  Change in appetite 0 0  Feeling bad or failure about yourself  0 0  Trouble concentrating 0 0  Moving slowly or fidgety/restless 0 0  Suicidal thoughts 0 0  PHQ-9 Score 0 0   GAD 7 : Generalized Anxiety Score 07/21/2019 06/29/2019  Nervous, Anxious, on Edge 0 0  Control/stop worrying 0 0  Worry too much - different things 0 0  Trouble relaxing 0 0  Restless 0 0  Easily annoyed or irritable 0 0  Afraid - awful might happen 0 0  Total GAD 7 Score 0 0

## 2019-07-17 ENCOUNTER — Ambulatory Visit: Payer: Medicaid Other

## 2019-07-21 ENCOUNTER — Ambulatory Visit (INDEPENDENT_AMBULATORY_CARE_PROVIDER_SITE_OTHER): Payer: Medicaid Other | Admitting: Clinical

## 2019-07-21 ENCOUNTER — Other Ambulatory Visit: Payer: Self-pay

## 2019-07-21 DIAGNOSIS — Z658 Other specified problems related to psychosocial circumstances: Secondary | ICD-10-CM | POA: Diagnosis not present

## 2019-07-21 DIAGNOSIS — O99345 Other mental disorders complicating the puerperium: Secondary | ICD-10-CM | POA: Diagnosis not present

## 2019-07-21 NOTE — Patient Instructions (Signed)
[facility] at [facility] [place], [place] [address] [phone] (main office) [phone] ([place] office)  [place] (Including therapy/psychiatry)  What if I or someone I know is in crisis?  . If you are thinking about harming yourself or having thoughts of suicide, or if you know someone who is, seek help right away.  . Call your doctor or mental health care provider.  . Call 911 or go to a hospital emergency room to get immediate help, or ask a friend or family member to help you do these things.  . Call the [place] National Suicide Prevention Lifeline's toll-free, 24-hour hotline at 1-800-273-TALK (310)120-2977) or TTY: 1-800-799-4 TTY 4781263702) to talk to a trained counselor.  . If you are in crisis, make sure you are not left alone.   . If someone else is in crisis, make sure he or she is not left alone   24 Hour :   [place] National Suicide Hotline: 754-467-4643  Therapeutic Alternative Mobile Crisis: [phone]   [facility]  [address], [place], [place] [address]  [phone] or [phone]  Family [facility] of the [employer] (Domestic Violence, Rape & Victim Assistance)  [phone]  [place]  [address], [place]  [address]   [phone] or [phone]   [place]: [phone] (8am-4pm) or [phone] (after hours)           [facility], [address], [place], [place] Fax: [phone] [email or web address]  *Interpreters available *Accepts Medicaid, Medicare, uninsured  [facility]   Mon-Fri: 8am-5pm [address], [place], [place] [phone](phone); [phone]) [email or web address]  *Accepts Medicare  [facility] [place], Fri: 8am-4pm [address], [place], [place] (phone); [phone] (fax) [email or web address]  *[place] Mon-Fri: 9am-5pm  [address] [place], [place] (phone); [phone]  [email or web address]  *Accepts Medicaid  [facility] [address], [place], [place]  [email or web address].[email or web address]   [facility] [place], 8:30am-12pm/1pm-2:30pm [address], [place], [place] (phone); [phone] (fax) www.[email or web address]  *Accepts Medicaid, sliding-scale*Bilingual services available  [facility] Mon-Fri, 8am-7pm [address], [place]  [phone](phone); [phone]) www.[email or web address]  *Accepts Medicaid *Bilingual services available  [facility] Mon-Fri: 8am-5pm, Saturday by appointment only [place], [place], [place] (phone); [phone] (fax) [email or web address]   [facility] [address] [place], [place], [place] [email or web address]  *Free & reduced services for uninsured and underinsured individuals *Bilingual services for Spanish-speaking clients 21 and under  [facility], [address], [place], [place]); [phone]) [email or web address]  *Bring your own interpreter at first visit *Accepts Medicare and [facility]  [place] Mon-Fri: 9am-5:30pm [address], [place], [place], [place] (phone), [phone] (fax) After hours crisis line: [phone] [email or web address]  *Accepts Medicare and Medicaid  [facility], 8am-6pm [address] [place], [place] (phone);  [phone] (fax) [email or web address]://[email or web address]  *Subsidized costs available  [facility] Mon-Fri: 8am-4pm [address], [place], [place] [phone](phone); [phone]) [email or web address]  *Accepts Medicaid  [facility] Same day access hours: Mon-Fri, 8:30-3pm Crisis hours: Mon-Fri, 8am-5pm [place], [place] Same day access hours: Mon-Fri, 8:30-3pm Crisis hours: Mon-Fri, 8am-8pm [address], [place], [place] (  phone); [phone] (fax) www.[email or web address]  *Accepts Medicaid and Medicare  [facility], [place], Fri: 9am-9pm Tues, Thurs: 9am-6pm [place], [place], [place] (phone); [phone] (fax) [email or web address]  *(Accepts Medicare and Medicaid; payment plans available)*Bilingual services available  [facility] [address], [place], [place] (phone); [phone] (fax) [email or web address] [address] [place], [place], [place]  [email or web address]  *Bilingual services available  [facility]otional [facility]) Mon-Thurs: 8am-8pm [address] [place], [place], [place] (phone); [phone] (fax) [email or web address]  *Accepts Medicaid*Bilingual services available  [place] [address], [place], [place], [place] (phone) [email or web address]  *Accepts Medicaid *Bilingual services available  [facility] Mon-Fri, 9am-4:45pm [address], [place], [place] (phone); [phone] (fax) [email or web address]  *Accepts Medicare  [facility] Mon-Thurs: 8:30-8pm, Fri: 8:30am-7pm [address], [place], [place] (3rd floor) [phone] (phone); [phone]  (fax) [email or web address]  *Accepts Medicaid; income-based reduced rates available  [facility] Mon-Fri: 8am-5pm [address], [place], [place], [place] (phone); [phone] (fax) [email or web address]  *Accepts Medicaid*Bilingual services available  [place], [place], [place], [place] (phone); [phone] (fax) www.[email or web address]  *Free emergency housing and clinical services for youth in crisis  [facility] ([place])  [address] [place] [phone] www.[email or web address]  *Provides direct services to individuals in recovery from mental illness, including support groups, recovery skills classes, and one on one peer support  [employer] on [place]) [place] helpline: [phone]  https://[email or web address]  *A community hub for information relating to local resources and services for the friends and families of individuals living alongside a mental health condition, as well as the individuals themselves. Classes and support groups also provided   /Emotional [employer] and Websites Here are a few free apps meant to help you to help yourself.  To find, try searching on the internet to see if the app is offered on Apple/Android devices. If your first choice doesn't come up on your device, the good news is that there are many choices! Play around with different apps to see which ones are helpful to you.    Calm This is an app meant to help increase calm feelings. Includes info, strategies, and tools for tracking your feelings.      Calm Harm  This app is meant to help with self-harm. Provides many 5-minute or 15-min coping strategies for doing instead of hurting yourself.       [place] is a problem-solving tool to help deal with emotions and cope with stress you encounter wherever you are.      MindShift This  app can help people cope with anxiety. Rather than trying to avoid anxiety, you can make an important shift and face it.      MY3  MY3 features a support system, safety plan and resources with the goal of offering a tool to use in a time of need.       My Life My Voice  This mood journal offers a simple solution for tracking your thoughts, feelings and moods. Animated emoticons can help identify your mood.       Relax Melodies Designed to help with sleep, on this app you can mix sounds and meditations for relaxation.      Smiling Mind Smiling Mind is meditation made easy: it's a simple tool that helps put a smile on your mind.  Stop, Breathe & Think  A friendly, simple guide for people through meditations for mindfulness and compassion.  Stop, Breathe and Think Kids Enter your current feelings and choose a "mission" to help you cope. Offers videos for certain moods instead of just sound recordings.       Team Orange The goal of this tool is to help teens change how they think, act, and react. This app helps you focus on your own good feelings and experiences.      The [place] Box The [place] Box (VHB) contains simple tools to help patients with coping, relaxation, distraction, and positive thinking.       BRAINSTORMING  Develop a Plan Goals: . Provide a way to start conversation about your new life with a baby . Assist parents in recognizing and using resources within their reach . Help pave the way before birth for an easier period of transition afterwards.  Make a list of the following information to keep in a central location: . Full name of Mom and Partner: _____________________________________________ . 29 full name and Date of Birth: ___________________________________________ . Home Address: ___________________________________________________________ ________________________________________________________________________ . Home  Phone: ____________________________________________________________ . Parents' cell numbers: _____________________________________________________ ________________________________________________________________________ . Name and contact info for OB: ______________________________________________ . Name and contact info for Pediatrician:________________________________________ . Contact info for Lactation Consultants: ________________________________________  REST and SLEEP *You each need at least 4-5 hours of uninterrupted sleep every day. Write specific names and contact information.* . How are you going to rest in the postpartum period? While partner's home? When partner returns to work? When you both return to work? Marland Kitchen Where will your baby sleep? Marland Kitchen Who is available to help during the day? Evening? Night? . Who could move in for a period to help support you? Marland Kitchen What are some ideas to help you get enough sleep? __________________________________________________________________________________________________________________________________________________________________________________________________________________________________________ NUTRITIOUS FOOD AND DRINK *Plan for meals before your baby is born so you can have healthy food to eat during the immediate postpartum period.* . Who will look after breakfast? Lunch? Dinner? List names and contact information. Brainstorm quick, healthy ideas for each meal. . What can you do before baby is born to prepare meals for the postpartum period? . How can others help you with meals? Marland Kitchen Which grocery stores provide online shopping and delivery? Marland Kitchen Which restaurants offer take-out or delivery  options? ______________________________________________________________________________________________________________________________________________________________________________________________________________________________________________________________________________________________________________________________________________________________________________________________________  CARE FOR MOM *It's important that mom is cared for and pampered in the postpartum period. Remember, the most important ways new mothers need care are: sleep, nutrition, gentle exercise, and time off.* . Who can come take care of mom during this period? Make a list of people with their contact information. . List some activities that make you feel cared for, rested, and energized? Who can make sure you have opportunities to do these things? . Does mom have a space of her very own within your home that's just for her? Make a "[facility]" where she can be comfortable, rest, and renew herself daily. ______________________________________________________________________________________________________________________________________________________________________________________________________________________________________________________________________________________________________________________________________________________________________________________________________    CARE FOR AND FEEDING BABY *Knowledgeable and encouraging people will offer the best support with regard to feeding your baby.* . Educate yourself and choose the best feeding option for your baby. . Make a list of people who will guide, support, and be a resource for you as your care for and feed your baby. (Friends that have breastfed or are currently breastfeeding, lactation consultants, breastfeeding support groups, etc.) . Consider a postpartum doula. (These websites can give you information: dona.org &  BuyingShow.es) . Seek out  local breastfeeding resources like the breastfeeding support group at [employer] or [employer]. ______________________________________________________________________________________________________________________________________________________________________________________________________________________________________________________________________________________________________________________________________________________________________________________________________  Verner Chol AND ERRANDS . Who can help with a thorough cleaning before baby is born? . Make a list of people who will help with housekeeping and chores, like laundry, light cleaning, dishes, bathrooms, etc. . Who can run some errands for you? Marland Kitchen What can you do to make sure you are stocked with basic supplies before baby is born? . Who is going to do the shopping? ______________________________________________________________________________________________________________________________________________________________________________________________________________________________________________________________________________________________________________________________________________________________________________________________________     Family Adjustment *Nurture yourselves.it helps parents be more loving and allows for better bonding with their child.* . What sorts of things do you and partner enjoy doing together? Which activities help you to connect and strengthen your relationship? Make a list of those things. Make a list of people whom you trust to care for your baby so you can have some time together as a couple. . What types of things help partner feel connected to Mom? Make a list. . What needs will partner have in order to bond with baby? . Other children? Who will care for them when you go into labor and while you are in the hospital? . Think about what the needs of your older children might be.  Who can help you meet those needs? In what ways are you helping them prepare for bringing baby home? List some specific strategies you have for family adjustment. _______________________________________________________________________________________________________________________________________________________________________________________________________________________________________________________________________________________________________________________________________________  SUPPORT *Someone who can empathize with experiences normalizes your problems and makes them more bearable.* . Make a list of other friends, neighbors, and/or co-workers you know with infants (and small children, if applicable) with whom you can connect. . Make a list of local or online support groups, mom groups, etc. in which you can be involved. ______________________________________________________________________________________________________________________________________________________________________________________________________________________________________________________________________________________________________________________________________________________________________________________________________  Childcare Plans . Investigate and plan for childcare if mom is returning to work. . Talk about mom's concerns about her transition back to work. . Talk about partner's concerns regarding this transition.  Mental Health *Your mental health is one of the highest priorities for a pregnant or postpartum mom.* . 1 in 5 women experience anxiety and/or depression from the time of conception through the first year after birth. . Postpartum Mood Disorders are the #1 complication of pregnancy and childbirth and the suffering experienced by these mothers is not necessary! These illnesses are temporary and respond well to treatment, which often includes self-care, social support, talk therapy,  and medication when needed. . Women experiencing anxiety and depression often say things like: "I'm supposed to be happy.why do I feel so sad?", "Why can't I snap out of it?", "I'm having thoughts that scare me." . There is no need to be embarrassed if you are feeling these symptoms: o Overwhelmed, anxious, angry, sad, guilty, irritable, hopeless, exhausted but can't sleep o You are NOT alone. You are NOT to blame. With help, you WILL be well. . Where can I find help? Medical professionals such as your OB, midwife, gynecologist, family practitioner, primary care provider, pediatrician, or mental health providers; [facility] support groups: Feelings After Birth, Breastfeeding Support Group, Baby and Me Group, and Fit 4 Two exercise classes. . You have permission to ask for help. It will confirm your feelings, validate your experiences, share/learn coping strategies, and gain support and encouragement as you heal. You are important! BRAINSTORM . Make a list of local resources, including resources for mom and for partner. . Identify support groups. . Identify people to call  late at night - include names and contact info. . Talk with partner about perinatal mood and anxiety disorders. . Talk with your OB, midwife, and doula about baby blues and about perinatal mood and anxiety disorders. . Talk with your pediatrician about perinatal mood and anxiety disorders.   Support & Sanity Savers   What do you really need?  . Basics . In preparing for a new baby, many expectant parents spend hours shopping for baby clothes, decorating the nursery, and deciding which car seat to buy. Yet most don't think much about what the reality of parenting a newborn will be like, and what they need to make it through that. So, here is the advice of experienced parents. We know you'll read this, and think "they're exaggerating, I don't really need that." Just trust [place] on these, OK? Plan for all of this, and if it turns  out you don't need it, come back and teach [place] how you did it!  Satira Anis (Once baby's survival needs are met, make sure you attend to your own survival needs!) . Sleep . An average newborn sleeps 16-18 hours per day, over 6-7 sleep periods, rarely more than three hours at a time. It is normal and healthy for a newborn to wake throughout the night... but really hard on parents!! . Naps. Prioritize sleep above any responsibilities like: cleaning house, visiting friends, running errands, etc.  Sleep whenever baby sleeps. If you can't nap, at least have restful times when baby eats. The more rest you get, the more patient you will be, the more emotionally stable, and better at solving problems.  . Food . You may not have realized it would be difficult to eat when you have a newborn. Yet, when we talk to . countless new parents, they say things like "it may be 2:00 pm when I realize I haven't had breakfast yet." Or "every time we sit down to dinner, baby needs to eat, and my food gets cold, so I don't bother to eat it." . Finger food. Before your baby is born, stock up with one months' worth of food that: 1) you can eat with one hand while holding a baby, 2) doesn't need to be prepped, 3) is good hot or cold, 4) doesn't spoil when left out for a few hours, and 5) you like to eat. Think about: nuts, dried fruit, Clif bars, pretzels, jerky, gogurt, baby carrots, apples, bananas, crackers, cheez-n-crackers, string cheese, hot pockets or frozen burritos to microwave, garden burgers and breakfast pastries to put in the toaster, yogurt drinks, etc. . Restaurant Menus. Make lists of your favorite restaurants & menu items. When family/friends want to help, you can give specific information without much thought. They can either bring you the food or send gift cards for just the right meals. Rosaura Carpenter Meals.  Take some time to make a few meals to put in the freezer ahead of time.  Easy to freeze meals can be  anything such as soup, lasagna, chicken pie, or spaghetti sauce. . Set up a Meal Schedule.  Ask friends and family to sign up to bring you meals during the first few weeks of being home. (It can be passed around at baby showers!) You have no idea how helpful this will be until you are in the throes of parenting.  https://hamilton-[email or web address] is a great website to check out. . Emotional Support . Know who to call when you're stressed out. Parenting a newborn is very challenging work.  There are times when it totally overwhelms your normal coping abilities. EVERY NEW PARENT NEEDS TO HAVE A PLAN FOR WHO TO CALL WHEN THEY JUST CAN'T COPE ANY MORE. (And it has to be someone other than the baby's other parent!) Before your baby is born, come up with at least one person you can call for support - write their phone number down and post it on the refrigerator. Marland Kitchen Anxiety & Sadness. Baby blues are normal after pregnancy; however, there are more severe types of anxiety & sadness which can occur and should not be ignored.  They are always treatable, but you have to take the first step by reaching out for help. [facility] offers a "Mom Talk" group which meets every Tuesday from 10 am - 11 am.  This group is for new moms who need support and connection after their babies are born.  Call [phone].  Marland Kitchen Really, Really Helpful (Plan for them! Make sure these happen often!!) . Physical Support with Taking Care of Yourselves . Asking friends and family. Before your baby is born, set up a schedule of people who can come and visit and help out (or ask a friend to schedule for you). Any time someone says "let me know what I can do to help," sign them up for a day. When they get there, their job is not to take care of the baby (that's your job and your joy). Their job is to take care of you!  . Postpartum doulas. If you don't have anyone you can call on for support, look into postpartum doulas:  professionals at helping parents  with caring for baby, caring for themselves, getting breastfeeding started, and helping with household tasks. www.padanc.org is a helpful website for learning about doulas in our area. . Peer Support / Parent Groups . Why: One of the greatest ideas for new parents is to be around other new parents. Parent groups give you a chance to share and listen to others who are going through the same season of life, get a sense of what is normal infant development by watching several babies learn and grow, share your stories of triumph and struggles with empathetic ears, and forgive your own mistakes when you realize all parents are learning by trial and error. . Where to find: There are many places you can meet other new parents throughout our community.  [facility] offers the following classes for new moms and their little ones:  Baby and Me (Birth to [place]) and Breastfeeding Support Group. Go to www.conehealthybaby.com or call [phone] for more information. . Time for your Relationship . It's easy to get so caught up in meeting baby's immediate needs that it's hard to find time to connect with your partner, and meet the needs of your relationship. It's also easy to forget what "quality time with your partner" actually looks like. If you take your baby on a date, you'd be amazed how much of your couple time is spent feeding the baby, diapering the baby, admiring the baby, and talking about the baby. . Dating: Try to take time for just the two of you. Babysitter tip: Sometimes when moms are breastfeeding a newborn, they find it hard to figure out how to schedule outings around baby's unpredictable feeding schedules. Have the babysitter come for a three hour period. When she comes over, if baby has just eaten, you can leave right away, and come back in two hours. If baby hasn't fed recently, you start the  date at home. Once baby gets hungry and gets a good feeding in, you can head out for the rest of your  date time. . Date Nights at Home: If you can't get out, at least set aside one evening a week to prioritize your relationship: whenever baby dozes off or doesn't have any immediate needs, spend a little time focusing on each other. . Potential conflicts: The main relationship conflicts that come up for new parents are: issues related to sexuality, financial stresses, a feeling of an unfair division of household tasks, and conflicts in parenting styles. The more you can work on these issues before baby arrives, the better!  Clint Guy and Frills (Don't forget these. and don't feel guilty for indulging in them!) . Everyone has something in life that is a fun little treat that they do just for themselves. It may be: reading the morning paper, or going for a daily jog, or having coffee with a friend once a week, or going to a movie on Friday nights, or fine chocolates, or bubble baths, or curling up with a good book. . Unless you do fun things for yourself every now and then, it's hard to have the energy for fun with your baby. Whatever your "special" treats are, make sure you find a way to continue to indulge in them after your baby is born. These special moments can recharge you, and allow you to return to baby with a new joy   PERINATAL MOOD DISORDERS: [place]   Emergency and Crisis Resources:  If you are an imminent risk to self or others, are experiencing intense personal distress, and/or have noticed significant changes in activities of daily living, call:  . 911 . [facility]: 315-877-6758 . Mobile Crisis: 860-089-2938 . National Suicide Hotline: (801)657-5130 Or visit the following crisis centers: . Local Emergency Departments . [facility]: [address], [place]. Hours: 8:30AM-5PM. Insurance Accepted: Medicaid, Medicare, and Uninsured.  [facility]  [address], [place] Mon-Friday 8am-3pm   [phone]                                                                                    Non-Crisis Resources: To identify specific providers that are covered by your insurance, contact your insurance company or local agencies: [place]: [phone] 617-8610 [facility] and [place]: [phone] [place]: [phone] Postpartum Support International- [place] 1-(820)626-0215                                                      Outpatient therapy and medication management providers:  [facility] [phone] Hours: 9AM-5PM  Insurance Accepted: Alben Spittle, Lorella Nimrod, Freddrick March, [place], Medicare  [facility] ([place]) [phone] Hours: 8AM-5PM  nsurance Accepted: All insurances EXCEPT AARP, [place], [place], and [place]: [phone]             Hours: 8AM-8PM Insurance Accepted: Cristal Ford, [place], [place],  Medicaid, Medicare, [phone] [facility]: [phone] Hours: 8:30AM-7PM Insurance Accepted: Cristal Ford, Medicaid, Medicare, Tricare, [employer] Counseling:  [place] Accepted:  Holland Falling, Lorella Nimrod, [employer], [place], [place] [phone] Hours: 9AM-5:30PM Insurance Accepted: Alben Spittle, [place], and Medicaid, Medicare, [employer] [facility]:  [phone] Hours: 9am-5pm Insurance Accepted: BCBS; they do not accept Medicaid/Medicare [place]: [phone] Hours: 9am-9pm Insurance Accepted: All major insurance including Medicaid and Medicare [facility]: [phone] Hours: 9AM-5:30PM Insurance Accepted: All insurances EXCEPT Medicaid and Medicare. [facility]: [place]: [phone] [place]:  [place] (support for children in the NICU and/or with special needs), [place] [employer]: [phone]                                                                                     Online Resources: Postpartum Support International: http://jones-berg.com/  800-944-4PPD 2Moms Supporting Moms:  www.momssupportingmoms.net                                                                                                 [facility] [place] - After Baby  (NOTE: DAYS/TIMES/LOCATION CHANGES POST-COVID)   Mom Talk  This mom-led group offers support and connection to mothers as they journey through the adjustments and struggles of that overwhelming first year after the birth of a child. A member of our [facility] staff will be present to share resources and additional support as you seek to care for yourself and baby. It is held at [facility] at 10:00amTuesday mornings, and at 6:00pm Thursday evenings. Babies welcome.  No registration or fee.  Breastfeeding Support Group The Breastfeeding Support Group is for new mothers who have questions or need support and encouragement with breastfeeding. In addition, it gives the opportunity to share with other mothers who value the breastfeeding experience. It meets each Monday evening at 7:00pm and each Tuesday from 11am-12:00pm at [facility] in the [place]. Come and bring your baby. No registration or fee.  Baby and Me 'Baby and Me' is both a learning experience and a time to meet other new moms and babies. Each week brings a new speaker or  activity. Topics include: Sleep Issues, Infant Massage, Baby Sign Language, Little Gym, Kindermusik, Nutrition Sense and many more. The group meets each Thursday from 11am to 12noon at [facility] in the [facility]. Come and bring your baby. No registration or fee.                                                                             For more information, call [phone]

## 2019-07-22 ENCOUNTER — Telehealth: Payer: Self-pay

## 2019-07-22 ENCOUNTER — Telehealth: Payer: Self-pay | Admitting: Obstetrics and Gynecology

## 2019-07-22 NOTE — Telephone Encounter (Signed)
Pt was offered an earlier and declined because she stated that her headaches are better.  Pt will f/u as needed and keep her appt scheduled on 08/04/19.    Addison Naegeli, RN

## 2019-07-22 NOTE — Telephone Encounter (Signed)
-----   Message from Rae Lips, [place] sent at 07/21/2019  6:02 PM EDT ----- Pt is having headache that has lasted the entire past week, sees Dr Alysia Penna on 08/04/19; has appointments scheduled with neurology for seizures. Dr. Alysia Penna, should she be seen earlier for headaches less than 3 weeks postpartum? Please advise.

## 2019-07-22 NOTE — Telephone Encounter (Signed)
I spoke with the patient about coming in for an appointment that was available today. She stated she could not come, and her headache was easing off. I spoke with Addison Naegeli RN about what the patient said.

## 2019-07-23 NOTE — BH Assessment (Addendum)
error 

## 2019-08-04 ENCOUNTER — Other Ambulatory Visit: Payer: Self-pay

## 2019-08-04 ENCOUNTER — Telehealth (INDEPENDENT_AMBULATORY_CARE_PROVIDER_SITE_OTHER): Payer: Medicaid Other | Admitting: Obstetrics and Gynecology

## 2019-08-04 ENCOUNTER — Encounter: Payer: Self-pay | Admitting: Obstetrics and Gynecology

## 2019-08-04 DIAGNOSIS — Z1389 Encounter for screening for other disorder: Secondary | ICD-10-CM | POA: Diagnosis not present

## 2019-08-04 MED ORDER — PRENATABS RX 29-1 MG PO TABS: 1.0000 | ORAL_TABLET | Freq: Every day | ORAL | 11 refills | Status: DC

## 2019-08-04 NOTE — Progress Notes (Signed)
I connected with Earma Reading. Edick on 08/04/19 at  8:55 AM EDT by: Mychart video and verified that I am speaking with the correct person using two identifiers.  Patient is located at home and provider is located at [employer] for [employer] at [employer] for [facility].     The purpose of this virtual visit is to provide medical care while limiting exposure to the novel coronavirus. I discussed the limitations, risks, security and privacy concerns of performing an evaluation and management service by My Chart and the availability of in person appointments. I also discussed with the patient that there may be a patient responsible charge related to this service. By engaging in this virtual visit, you consent to the provision of healthcare.  Additionally, you authorize for your insurance to be billed for the services provided during this visit. The patient expressed understanding and agreed to proceed.  The following staff members participated in the virtual visit: Maxwell Marion, RN  Post Partum Visit Note Subjective:   Natalie Lawrence is a 29 y.o. [phone] female being evaluated for postpartum followup.  She is 4 weeks 4 days postpartum following a normal spontaneous vaginal delivery at  [redacted]w[redacted]d. I have fully reviewed the prenatal and intrapartum course; pregnancy was uncomplicated.  Postpartum course has been unremarkable. Baby is doing well. Baby is feeding by breast. Pt reports vaginal bleeding decreased to spotting following delivery; a few days ago began bleeding like a light period. Bowel function is normal. Bladder function is normal. Patient is not sexually active. Contraception method is tubal ligation. Postpartum depression screening: negative.  The following portions of the patient's history were reviewed and updated as appropriate: allergies, current medications, past family history, past medical history, past social history, past surgical history and problem list.  Review of  Systems Pertinent items noted in HPI and remainder of comprehensive ROS otherwise negative.   Objective:  There were no vitals filed for this visit. Self-Obtained       Assessment:    Nl postpartum exam. S/P BTL H/O Sz disorder  Plan:  Essential components of care per ACOG recommendations:  1.  Mood and well being: Patient with negative depression screening today. Reviewed local resources for support.  - Patient does not use tobacco.  - hx of drug use? No    2. Infant care and feeding:  -Patient currently breastmilk feeding? Yes If breastmilk feeding discussed return to work and pumping. If needed, patient was provided letter for work to allow for every 2-3 hr pumping breaks, and to be granted a private location to express breastmilk and refrigerated area to store breastmilk. Reviewed importance of draining breast regularly to support lactation. -Social determinants of health (SDOH) reviewed in EPIC. No concerns.  3. Sexuality, contraception and birth spacing - Patient does not want a pregnancy in the next year.  Desired family size is completed - Reviewed forms of contraception in tiered fashion. Patient desired bilateral tubal ligation today.   - Discussed birth spacing of 18 months  4. Sleep and fatigue -Encouraged family/partner/community support of 4 hrs of uninterrupted sleep to help with mood and fatigue  5. Physical Recovery  - Discussed patients delivery  - Patient had a sidewall laceration, perineal healing reviewed. Patient expressed understanding - Patient has urinary incontinence? No - Patient is safe to resume physical and sexual activity  6.  Health Maintenance - Last pap smear done 12/20 and was normal with negative HPV.   7. Chronic Disease - PCP follow  up  8 minutes of non-face-to-face time spent with the patient    Annabell Howells, [place] for [employer], [place]

## 2019-08-05 ENCOUNTER — Ambulatory Visit (INDEPENDENT_AMBULATORY_CARE_PROVIDER_SITE_OTHER): Payer: Medicaid Other | Admitting: Clinical

## 2019-08-05 DIAGNOSIS — Z658 Other specified problems related to psychosocial circumstances: Secondary | ICD-10-CM | POA: Diagnosis not present

## 2019-08-05 DIAGNOSIS — F439 Reaction to severe stress, unspecified: Secondary | ICD-10-CM

## 2019-08-05 NOTE — BH Specialist Note (Signed)
Integrated Behavioral Health via Telemedicine Video Visit  07/23/2019 [name] [id number]  Number of Integrated Behavioral Health visits: 2 Session Start time: 3:45  Session End time: 4:01 Total time: 16  Referring Provider: Dewitt Rota, DO Type of Visit: Video Patient/Family location: Home [facility] Provider location: [facility]  All persons participating in visit: Patient Britainy Lawrence and [facility] Amory Zbikowski    Confirmed patient's address: Yes  Confirmed patient's phone number: Yes  Any changes to demographics: No   Confirmed patient's insurance: Yes  Any changes to patient's insurance: No   Discussed confidentiality: at previous visit  I connected with Natalie Lawrence  by a video enabled telemedicine application and verified that I am speaking with the correct person using two identifiers.     I discussed the limitations of evaluation and management by telemedicine and the availability of in person appointments.  I discussed that the purpose of this visit is to provide behavioral health care while limiting exposure to the novel coronavirus.   Discussed there is a possibility of technology failure and discussed alternative modes of communication if that failure occurs.  I discussed that engaging in this video visit, they consent to the provision of behavioral healthcare and the services will be billed under their insurance.  Patient and/or legal guardian expressed understanding and consented to video visit: Yes   PRESENTING CONCERNS: Patient and/or family reports the following symptoms/concerns: Pt is adjusting well to new motherhood; is no longer having headaches, has good support at home, is NOT experiencing symptoms of anxiety or depression; no particular concerns at this time.  Duration of problem: Ongoing; Severity of problem: mild  STRENGTHS (Protective Factors/Coping Skills): Good social support at  home; resiliency   GOALS ADDRESSED: Patient will: 1.  Reduce symptoms of: stress  2.  Demonstrate ability to: Increase healthy adjustment to current life circumstances  INTERVENTIONS: Interventions utilized:  Supportive Counseling Standardized Assessments completed: Not Needed Pt did edinburgh yesterday with score of 0  ASSESSMENT: Patient currently experiencing Psychosocial stress, mild  Patient may benefit from continued psychoeducation and brief therapeutic interventions regarding coping with symptoms of mild current life stress .  PLAN: 1. Follow up with behavioral health clinician on : As needed 2. Behavioral recommendations:  -Continue using Worry Time strategy as needed, to manage current life stressors -Continue with plan to establish care with ongoing therapist of choice -Continue to consider online new mom support group   3. Referral(s): Integrated [employer] (In Clinic)  I discussed the assessment and treatment plan with the patient and/or parent/guardian. They were provided an opportunity to ask questions and all were answered. They agreed with the plan and demonstrated an understanding of the instructions.   They were advised to call back or seek an in-person evaluation if the symptoms worsen or if the condition fails to improve as anticipated.  Valetta Close Natalie Lawrence  Depression screen [facility] 2/9 07/21/2019 06/29/2019  Decreased Interest 0 0  Down, Depressed, Hopeless 0 0  PHQ - 2 Score 0 0  Altered sleeping 0 0  Tired, decreased energy 0 0  Change in appetite 0 0  Feeling bad or failure about yourself  0 0  Trouble concentrating 0 0  Moving slowly or fidgety/restless 0 0  Suicidal thoughts 0 0  PHQ-9 Score 0 0   GAD 7 : Generalized Anxiety Score 07/21/2019 06/29/2019  Nervous, Anxious, on Edge 0 0  Control/stop worrying 0 0  Worry  too much - different things 0 0  Trouble relaxing 0 0  Restless 0 0  Easily annoyed or irritable 0 0  Afraid - awful  might happen 0 0  Total GAD 7 Score 0 0

## 2019-08-18 ENCOUNTER — Encounter (HOSPITAL_COMMUNITY): Payer: Self-pay

## 2019-08-18 ENCOUNTER — Telehealth: Payer: Self-pay | Admitting: Neurology

## 2019-08-18 ENCOUNTER — Emergency Department (HOSPITAL_COMMUNITY)
Admission: EM | Admit: 2019-08-18 | Discharge: 2019-08-19 | Disposition: A | Discharge status: Home / Self Care | Payer: Medicaid Other | Attending: Emergency Medicine | Admitting: Emergency Medicine

## 2019-08-18 ENCOUNTER — Other Ambulatory Visit: Payer: Self-pay

## 2019-08-18 DIAGNOSIS — Z87891 Personal history of nicotine dependence: Secondary | ICD-10-CM | POA: Insufficient documentation

## 2019-08-18 DIAGNOSIS — Y998 Other external cause status: Secondary | ICD-10-CM | POA: Insufficient documentation

## 2019-08-18 DIAGNOSIS — Y9289 Other specified places as the place of occurrence of the external cause: Secondary | ICD-10-CM | POA: Insufficient documentation

## 2019-08-18 DIAGNOSIS — S0181XA Laceration without foreign body of other part of head, initial encounter: Secondary | ICD-10-CM | POA: Diagnosis not present

## 2019-08-18 DIAGNOSIS — W1849XA Other slipping, tripping and stumbling without falling, initial encounter: Secondary | ICD-10-CM | POA: Insufficient documentation

## 2019-08-18 DIAGNOSIS — Y9301 Activity, walking, marching and hiking: Secondary | ICD-10-CM | POA: Diagnosis not present

## 2019-08-18 DIAGNOSIS — S0990XA Unspecified injury of head, initial encounter: Secondary | ICD-10-CM | POA: Diagnosis present

## 2019-08-18 MED ORDER — LIDOCAINE HCL (PF) 1 % IJ SOLN
5.0000 mL | Freq: Once | INTRAMUSCULAR | Status: AC
  Administered 2019-08-18: 5 mL via INTRADERMAL
  Filled 2019-08-18: qty 30

## 2019-08-18 NOTE — Telephone Encounter (Signed)
Patient is seeing Sr. Aquino on the 25th of this month for transfer of care. Please tell her that she absolutely has to get childcare and go to this appointment, she cannot miss this appointment as she may not get in to Dr. Karel Jarvis again or she will have to go to [facility] or [place]. She has tried and failed multiple seizure medications, and at last appointment I told her she needed an epileptologist to manage her seizures considering her history. I told her her care cannot be effectively managed except by an epileptologist at this point. Thanks.

## 2019-08-18 NOTE — ED Triage Notes (Signed)
Pt tripped over a toy and hit her head on the coffee table, she has about a 2 inch laceration to her forehead, bleeding controlled at present

## 2019-08-18 NOTE — Telephone Encounter (Signed)
Natalie Lawrence, Pt cancelled tomorrow to reschedule 7/28 because she has a newborn and shouldn't get a sitter, She advised she had a seizure last night and wanted to know if you can call her to discuss.  Thank you
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
## 2019-08-19 ENCOUNTER — Ambulatory Visit: Payer: Medicaid Other | Admitting: Neurology

## 2019-08-19 ENCOUNTER — Telehealth: Payer: Medicaid Other | Admitting: Adult Health

## 2019-08-19 MED ORDER — IBUPROFEN 800 MG PO TABS
800.0000 mg | ORAL_TABLET | Freq: Three times a day (TID) | ORAL | 0 refills | Status: DC
Start: 2019-08-19 — End: 2019-10-30

## 2019-08-19 MED ORDER — IBUPROFEN 800 MG PO TABS
800.0000 mg | ORAL_TABLET | Freq: Once | ORAL | Status: AC
  Administered 2019-08-19: 800 mg via ORAL
  Filled 2019-08-19: qty 1

## 2019-08-19 NOTE — Telephone Encounter (Signed)
Dr. Lucia Gaskins made aware of pt's no show today for VV.

## 2019-08-19 NOTE — Discharge Instructions (Addendum)
Take the prescribed medication as directed.  Can apply ice to forehead to keep swelling down. Keep sutures clean and dry. Follow-up with urgent care for suture removal in the next 7-10 days. Return to the ED for new or worsening symptoms.

## 2019-08-24 ENCOUNTER — Telehealth: Payer: Self-pay

## 2019-08-24 NOTE — Telephone Encounter (Signed)
Pt called office and left a VM asking if she could schedule a VV with Dr. Lucia Gaskins. Please call her back.

## 2019-08-24 NOTE — Telephone Encounter (Signed)
Called the patient back. She was calling in reference to apt that was scheduled with the seizure specialist. I advised that upon reviewing that apt that is with [facility] neurology with Dr Karel Jarvis I provided the number to their office and advised the patient would have to contact their office. Pt verbalized understanding.

## 2019-08-27 ENCOUNTER — Encounter: Payer: Self-pay | Admitting: Neurology

## 2019-08-28 ENCOUNTER — Other Ambulatory Visit: Payer: Self-pay

## 2019-08-28 ENCOUNTER — Telehealth (INDEPENDENT_AMBULATORY_CARE_PROVIDER_SITE_OTHER): Payer: Medicaid Other | Admitting: Neurology

## 2019-08-28 VITALS — Ht 66.0 in | Wt 230.0 lb

## 2019-08-28 DIAGNOSIS — G40009 Localization-related (focal) (partial) idiopathic epilepsy and epileptic syndromes with seizures of localized onset, not intractable, without status epilepticus: Secondary | ICD-10-CM

## 2019-08-28 NOTE — Progress Notes (Signed)
Virtual Visit via Video Note The purpose of this virtual visit is to provide medical care while limiting exposure to the novel coronavirus.    Consent was obtained for video visit:  Yes.   Answered questions that patient had about telehealth interaction:  Yes.   I discussed the limitations, risks, security and privacy concerns of performing an evaluation and management service by telemedicine. I also discussed with the patient that there may be a patient responsible charge related to this service. The patient expressed understanding and agreed to proceed.  Pt location: Home Physician Location: office Name of referring provider:  Anson Fret, MD I connected with [name] at patients initiation/request on 08/28/2019 at 10:30 AM EDT by video enabled telemedicine application and verified that I am speaking with the correct person using two identifiers. Pt MRN:  [id number] Pt DOB:  Sep 20, 1990 Video Participants:  [name]   History of Present Illness:  The patient was seen as a virtual video visit on 08/28/2019. She has seen several neurologists, records have been reviewed and will be summarized as follows. She is a 29 year old right-handed woman with a history of seizures diagnosed at age 73. She states majority of her seizures are nocturnal. The first seizure occurred in 2015. She is noted to have convulsions with head version to the left. She would feel hot and cold flashes followed by dizziness prior to the convulsions. She has had only 2-3 seizures during wakefulness, last occurred a year ago. Sometimes she gets a warning up to 1 hour prior to the convulsions. She is confused and amnestic of events, sleepy after, with headaches and jaw pain. No focal weakness. She had been seeing Dr. Sherryll Burger at [facility] from 2018-2019, then Dr. Lucia Gaskins last 05/2019 on the third trimester of her pregnancy. On her visit with Dr. Lucia Gaskins, she was noted to have seizures in February, March,  she reports a seizure at the end of May/beginning of June, then her last seizure was 08/18/19. Triggers include heat, stress, when she is overworked. She has occasional episodes where she feels very hot and dizzy like she will fall. She denies any olfactory/gustatory hallucinations, deja vu, rising epigastric sensation, focal numbness/tingling/weakness, myoclonic jerks. She denies any staring/unresponsive episodes or gaps in time. She has tried Levetiracetam, Lamotrigine, carbamazepine, oxcarbazepine, phenytoin. She was last prescribed Levetiracetam during her visit with Dr. Lucia Gaskins in 05/2019, however she would start and stop it during her pregnancies, she is not taking seizure medication. She states it was "the 4th or 5th medication, the worst I've been on." She does not think it works, she feels spaced out and her depth perception would be different.   She has migraines 405 times a month, mostly on the right side with occasional nausea. She does not take any prn medications. She denies any diplopia, dysarthria/dysphagia, neck/back pain, bowel/bladder dysfunction. She feels her memory started to fade after seizures, she could not remember things from 2 days ago. She has 2 children, a 66 and a half year old and her 39 month old baby. She is breastfeeding and does not want to start any seizure medications at this time. She has no future pregnancy plans, she has had tubal ligation.  Prior AEDs: Keppra, LTG, OXC, Phenytoin, CBZ  Diagnostic Data: EEGs: Routine EEG in 01/2016 showed diffuse beta activity, otherwise normal.  72-hour EEG in 12/2017 was abnormal due to right > left frontal epileptiform discharges, typical events not captured.   MRI: none available for  review   PAST MEDICAL HISTORY: Past Medical History:  Diagnosis Date  . Epilepsy ([place])   . Epilepsy ([place]) 04/2013  . GBS (group B Streptococcus carrier), +RV culture, currently pregnant 07/01/2019  . Seizures ([place])     PAST SURGICAL  HISTORY: Past Surgical History:  Procedure Laterality Date  . CESAREAN SECTION  2012  . TUBAL LIGATION Bilateral 07/03/2019   Procedure: POST PARTUM TUBAL LIGATION;  Surgeon: Chancy Milroy, MD;  Location: [facility] LD ORS;  Service: Gynecology;  Laterality: Bilateral;    MEDICATIONS: Current Outpatient Medications on File Prior to Visit  Medication Sig Dispense Refill  . Prenatal Vit-Iron Carbonyl-FA (PRENATABS RX) 29-1 MG TABS Take 1 tablet by mouth daily. 30 tablet 11  . ibuprofen (ADVIL) 800 MG tablet Take 1 tablet (800 mg total) by mouth 3 (three) times daily. (Patient not taking: Reported on 08/27/2019) 21 tablet 0   No current facility-administered medications on file prior to visit.    ALLERGIES: No Known Allergies  FAMILY HISTORY: Family History  Problem Relation Age of Onset  . Hypertension Paternal Grandmother   . Diabetes Paternal Grandmother   . Breast cancer Paternal Grandmother   . Seizures Paternal Grandmother   . Hypertension Paternal Grandfather       Observations/Objective:   Vitals:   08/27/19 1520  Weight: 230 lb (104.3 kg)  Height: 5\' 6"  (1.676 m)   GEN:  The patient appears stated age and is in NAD.  Neurological examination: Patient is awake, alert, oriented x 3. No aphasia or dysarthria. Intact fluency and comprehension. Remote and recent memory intact. Able to name and repeat. Cranial nerves: Extraocular movements intact with no nystagmus. No facial asymmetry. Motor: moves all extremities symmetrically, at least anti-gravity x 4. No incoordination on finger to nose testing. Gait: narrow-based and steady.   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 Majority of seizures are nocturnal, her last nocturnal seizure was 08/18/2019, last seizure in wakefulness a year ago. Prior prolonged EEG reported right > left frontal epileptiform discharges. No  prior MRI available for review, she is agreeable to having MRI brain with and without contrast done, but after extensive discussion of recommendation to start AED and risks of not taking seizure medication, including SUDEP, she accepts the risks and declines starting medication.  [place] driving laws were discussed with the patient, and she knows to stop driving after a seizure, until 6 months seizure-free. She will follow-up in 6 months and knows to call our office once she is ready to start medication.   Follow Up Instructions:   -I discussed the assessment and treatment plan with the patient. The patient was provided an opportunity to ask questions and all were answered. The patient agreed with the plan and demonstrated an understanding of the instructions.   The patient was advised to call back or seek an in-person evaluation if the symptoms worsen or if the condition fails to improve as anticipated.   Cameron Sprang, MD

## 2019-09-02 ENCOUNTER — Other Ambulatory Visit: Payer: Self-pay

## 2019-09-02 ENCOUNTER — Encounter (HOSPITAL_COMMUNITY): Payer: Self-pay

## 2019-09-02 ENCOUNTER — Ambulatory Visit (HOSPITAL_COMMUNITY)
Admission: EM | Admit: 2019-09-02 | Discharge: 2019-09-02 | Disposition: A | Discharge status: Home / Self Care | Payer: Medicaid Other

## 2019-09-02 MED ORDER — BACITRACIN ZINC 500 UNIT/GM EX OINT
TOPICAL_OINTMENT | CUTANEOUS | Status: AC
  Filled 2019-09-02: qty 0.9

## 2019-09-02 NOTE — ED Triage Notes (Signed)
Pt presents for suture removal in forehead.

## 2019-09-02 NOTE — ED Notes (Signed)
3 sutures were removed from pt forehead. Wound was clean, dry and well-healed. Pt tolerated the procedure.

## 2019-09-14 ENCOUNTER — Encounter: Payer: Self-pay | Admitting: Neurology

## 2019-09-30 ENCOUNTER — Telehealth: Payer: Self-pay

## 2019-09-30 NOTE — Telephone Encounter (Signed)
Spoke with pt. She was seen 05/25/19. Pt verbalized appreciation.

## 2019-09-30 NOTE — Telephone Encounter (Signed)
Voicemail message, 10:52am:  Patient is asking for a list of her visit dates with [place] so that she can send them to her lawyer.

## 2019-10-01 ENCOUNTER — Ambulatory Visit: Payer: Medicaid Other | Admitting: Neurology

## 2019-10-03 ENCOUNTER — Other Ambulatory Visit: Payer: Self-pay

## 2019-10-03 ENCOUNTER — Ambulatory Visit
Admission: RE | Admit: 2019-10-03 | Discharge: 2019-10-03 | Disposition: A | Discharge status: Home / Self Care | Payer: Medicaid Other | Source: Ambulatory Visit | Attending: Neurology | Admitting: Neurology

## 2019-10-03 DIAGNOSIS — G40009 Localization-related (focal) (partial) idiopathic epilepsy and epileptic syndromes with seizures of localized onset, not intractable, without status epilepticus: Secondary | ICD-10-CM

## 2019-10-03 MED ORDER — GADOBENATE DIMEGLUMINE 529 MG/ML IV SOLN
20.0000 mL | Freq: Once | INTRAVENOUS | Status: AC | PRN
  Administered 2019-10-03: 20 mL via INTRAVENOUS

## 2019-10-06 ENCOUNTER — Telehealth: Payer: Self-pay | Admitting: Neurology

## 2019-10-06 ENCOUNTER — Telehealth: Payer: Self-pay

## 2019-10-06 NOTE — Telephone Encounter (Signed)
Spoke to pt informed her that MRI brain looks fine, no evidence of tumor, stroke, or bleed.  How is she feeling? Pt stated that she has been feeling off. More headaches, she stated that during her MRI she had a strange feeling the last 5 mins with a stabbing feeling, with increase in headache..   she is ready to start seizure medication. Pt is also asking about what she seen on her MRI report on her my chart? 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

## 2019-10-06 NOTE — Telephone Encounter (Signed)
See result note.  

## 2019-10-06 NOTE — Telephone Encounter (Signed)
-----   Message from Van Clines, MD sent at 10/06/2019 12:32 PM EDT ----- Pls let her know MRI brain looks fine, no evidence of tumor, stroke, or bleed. How is she feeling? Pls let her know to call me once she is ready to start seizure medication. Thanks

## 2019-10-06 NOTE — Telephone Encounter (Signed)
Patient called in to see if her results for her MRI are in.

## 2019-10-07 NOTE — Telephone Encounter (Signed)
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 It is usually a benign finding that would not cause her symptoms. I have an opening on Aug 27 at 4pm so we can discuss medications, if she can do either in person or VV. Thanks

## 2019-10-07 NOTE — Telephone Encounter (Signed)
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 It is usually a benign finding that would not cause her symptoms. Pt will take the Aug 27th appointment at 4 pm at a VV appointment

## 2019-10-30 ENCOUNTER — Encounter: Payer: Self-pay | Admitting: Neurology

## 2019-10-30 ENCOUNTER — Telehealth (INDEPENDENT_AMBULATORY_CARE_PROVIDER_SITE_OTHER): Payer: Medicaid Other | Admitting: Neurology

## 2019-10-30 ENCOUNTER — Other Ambulatory Visit: Payer: Self-pay

## 2019-10-30 VITALS — Ht 66.0 in | Wt 230.0 lb

## 2019-10-30 DIAGNOSIS — G43009 Migraine without aura, not intractable, without status migrainosus: Secondary | ICD-10-CM

## 2019-10-30 DIAGNOSIS — G40009 Localization-related (focal) (partial) idiopathic epilepsy and epileptic syndromes with seizures of localized onset, not intractable, without status epilepticus: Secondary | ICD-10-CM

## 2019-10-30 MED ORDER — ZONISAMIDE 100 MG PO CAPS: ORAL_CAPSULE | ORAL | 5 refills | Status: DC

## 2019-10-30 NOTE — Progress Notes (Signed)
Virtual Visit via Video Note The purpose of this virtual visit is to provide medical care while limiting exposure to the novel coronavirus.    Consent was obtained for video visit:  Yes.   Answered questions that patient had about telehealth interaction:  Yes.   I discussed the limitations, risks, security and privacy concerns of performing an evaluation and management service by telemedicine. I also discussed with the patient that there may be a patient responsible charge related to this service. The patient expressed understanding and agreed to proceed.  Pt location: Home Physician Location: office Name of referring provider:  No ref. provider found I connected with Natalie Lawrence at patients initiation/request on 10/30/2019 at  4:00 PM EDT by video enabled telemedicine application and verified that I am speaking with the correct person using two identifiers. Pt MRN:  [id number] Pt DOB:  1991/02/03 Video Participants:  Natalie Lawrence   History of Present Illness:  The patient had a virtual visit on 10/30/2019. Unable to connect via video due to technical difficulties. She agreed to do a telephone visit. She was last seen in the neurology clinic 2 months ago for focal to bilateral tonic-clonic epilepsy. Prior EEG had shown right>left frontal epileptiform discharges. I personally reviewed MRI brain with and without contrast done 10/2019 which did not show any acute changes. There were a few foci of minimal T2 signal in the frontal white matter. Hippocampi symmetric with no abnormal signal or enhancement. 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 She expressed interest in starting seizure medication. She also reported more headaches, during her MRI she had a strange feeling lasting 5 minutes with a stabbing feeling. A week later, she had a regular headache that progressively worsened. She has migraines every week. Her last nocturnal seizure was on  10/08/19, she woke up with tongue bite on both sides. She is breastfeeding.    History on Initial Assessment 08/28/2019: The patient was seen as a virtual video visit on 08/28/2019. She has seen several neurologists, records have been reviewed and will be summarized as follows. She is a 29 year old right-handed woman with a history of seizures diagnosed at age 10. She states majority of her seizures are nocturnal. The first seizure occurred in 2015. She is noted to have convulsions with head version to the left. She would feel hot and cold flashes followed by dizziness prior to the convulsions. She has had only 2-3 seizures during wakefulness, last occurred a year ago. Sometimes she gets a warning up to 1 hour prior to the convulsions. She is confused and amnestic of events, sleepy after, with headaches and jaw pain. No focal weakness. She had been seeing Dr. Sherryll Burger at [facility] from 2018-2019, then Dr. Lucia Gaskins last 05/2019 on the third trimester of her pregnancy. On her visit with Dr. Lucia Gaskins, she was noted to have seizures in February, March, she reports a seizure at the end of May/beginning of June, then her last seizure was 08/18/19. Triggers include heat, stress, when she is overworked. She has occasional episodes where she feels very hot and dizzy like she will fall. She denies any olfactory/gustatory hallucinations, deja vu, rising epigastric sensation, focal numbness/tingling/weakness, myoclonic jerks. She denies any staring/unresponsive episodes or gaps in time. She has tried Levetiracetam, Lamotrigine, carbamazepine, oxcarbazepine, phenytoin. She was last prescribed Levetiracetam during her visit with Dr. Lucia Gaskins in 05/2019, however she would start and stop it during her pregnancies, she is not taking seizure  medication. She states it was "the 4th or 5th medication, the worst I've been on." She does not think it works, she feels spaced out and her depth perception would be different.   She has migraines 4-5  times a month, mostly on the right side with occasional nausea. She does not take any prn medications. She denies any diplopia, dysarthria/dysphagia, neck/back pain, bowel/bladder dysfunction. She feels her memory started to fade after seizures, she could not remember things from 2 days ago. She has 2 children, a 46 and a half year old and her 31 month old baby. She is breastfeeding and does not want to start any seizure medications at this time. She has no future pregnancy plans, she has had tubal ligation.  Prior AEDs: Levetiracetam, Lamotrigine, oxcarbazepine, Phenytoin, carbamazepine  Diagnostic Data: EEGs: Routine EEG in 01/2016 showed diffuse beta activity, otherwise normal.  72-hour EEG in 12/2017 was abnormal due to right > left frontal epileptiform discharges, typical events not captured.   MRI: none available for review   Current Outpatient Medications on File Prior to Visit  Medication Sig Dispense Refill  . Prenatal Vit-Iron Carbonyl-FA (PRENATABS RX) 29-1 MG TABS Take 1 tablet by mouth daily. 30 tablet 11   No current facility-administered medications on file prior to visit.     Observations/Objective:   Vitals:   10/30/19 1537  Weight: 230 lb (104.3 kg)  Height: 5\' 6"  (1.676 m)   Exam limited due to nature of phone visit. Patient awake, alert, able to answer questions without confusion or dysarthria.   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 Majority of seizures are nocturnal, her last nocturnal seizure was 08/18/2019, last seizure in wakefulness a year ago. Prior prolonged EEG reported right > left frontal epileptiform discharges. MRI brain no acute changes. Findings were discussed today. She is interested in starting seizure medication. We discussed starting Zonisamide for seizure and migraine prophylaxis. She is breastfeeding, we discussed that there is excretion of  Zonisamide in breastmilk but no reports of adverse events and it is compatible with breastfeeding with careful monitoring. We agreed to start low dose Zonisamide 100mg  qhs and monitor symptoms. She was advised to keep a seizure and migraine calendar. She is aware of [place] driving laws to stop driving after a seizure until 6 months seizure-free. Follow-up in 2 months, she knows to call for any changes.    Follow Up Instructions:   -I discussed the assessment and treatment plan with the patient. The patient was provided an opportunity to ask questions and all were answered. The patient agreed with the plan and demonstrated an understanding of the instructions.   The patient was advised to call back or seek an in-person evaluation if the symptoms worsen or if the condition fails to improve as anticipated.    Total time spent on today's visit was 12 minutes.  Time included that spent on review of records (prior notes available to me/labs/imaging if pertinent), discussing treatment and goals, answering patient's questions and coordinating care.   08/20/2019, MD

## 2019-11-03 ENCOUNTER — Telehealth: Payer: Self-pay | Admitting: Neurology

## 2019-11-03 NOTE — Telephone Encounter (Signed)
Patient states she had a seizure last night. She bit both sides of her tongue, left side worse than the right. She thinks she must have bumped her head because she woke up with a bruise on her forehead. Please call.

## 2019-11-06 NOTE — Telephone Encounter (Signed)
Called and spoke to patient  And she started the medication and doesn't care for it. She stated it makes her feel "spaced out and not in right state of mind to take care of her two kids." Patient states she feels ok, only experiencing a slight headache since her seizure. Informed her that I would let Dr. Karel Jarvis know.

## 2019-11-06 NOTE — Telephone Encounter (Signed)
Called patient twice at 2:12pm and 2:13pm and patient was unable to hear me and hung up.

## 2019-11-06 NOTE — Telephone Encounter (Signed)
Pls have her try taking it every other night for 2 weeks and see if this helps her tolerate it better. Thanks

## 2019-12-31 ENCOUNTER — Telehealth: Payer: Self-pay

## 2019-12-31 ENCOUNTER — Other Ambulatory Visit: Payer: Self-pay

## 2019-12-31 ENCOUNTER — Telehealth: Payer: Medicaid Other | Admitting: Neurology

## 2019-12-31 NOTE — Telephone Encounter (Signed)
Pt was called twice and the link was sent for her VV today with Dr Karel Jarvis, pt did not answer. Pt will need to R/S her appointment,

## 2020-02-12 ENCOUNTER — Encounter: Payer: Self-pay | Admitting: General Practice

## 2020-06-05 ENCOUNTER — Emergency Department (HOSPITAL_COMMUNITY)
Admission: EM | Admit: 2020-06-05 | Discharge: 2020-06-05 | Disposition: A | Discharge status: Home / Self Care | Payer: Medicaid Other | Attending: Emergency Medicine | Admitting: Emergency Medicine

## 2020-06-05 ENCOUNTER — Encounter (HOSPITAL_COMMUNITY): Payer: Self-pay

## 2020-06-05 ENCOUNTER — Other Ambulatory Visit: Payer: Self-pay

## 2020-06-05 ENCOUNTER — Emergency Department (HOSPITAL_COMMUNITY): Payer: Medicaid Other

## 2020-06-05 DIAGNOSIS — R0981 Nasal congestion: Secondary | ICD-10-CM | POA: Insufficient documentation

## 2020-06-05 DIAGNOSIS — R6883 Chills (without fever): Secondary | ICD-10-CM | POA: Insufficient documentation

## 2020-06-05 DIAGNOSIS — R000 Tachycardia, unspecified: Secondary | ICD-10-CM | POA: Insufficient documentation

## 2020-06-05 DIAGNOSIS — G40909 Epilepsy, unspecified, not intractable, without status epilepticus: Secondary | ICD-10-CM | POA: Insufficient documentation

## 2020-06-05 DIAGNOSIS — Z28310 Unvaccinated for covid-19: Secondary | ICD-10-CM | POA: Insufficient documentation

## 2020-06-05 DIAGNOSIS — Z20822 Contact with and (suspected) exposure to covid-19: Secondary | ICD-10-CM | POA: Insufficient documentation

## 2020-06-05 DIAGNOSIS — Z87891 Personal history of nicotine dependence: Secondary | ICD-10-CM | POA: Insufficient documentation

## 2020-06-05 DIAGNOSIS — R569 Unspecified convulsions: Secondary | ICD-10-CM | POA: Diagnosis present

## 2020-06-05 LAB — CBC WITH DIFFERENTIAL/PLATELET
Abs Immature Granulocytes: 0.03 10*3/uL (ref 0.00–0.07)
Basophils Absolute: 0 10*3/uL (ref 0.0–0.1)
Basophils Relative: 0 %
Eosinophils Absolute: 0 10*3/uL (ref 0.0–0.5)
Eosinophils Relative: 0 %
HCT: 38.9 % (ref 36.0–46.0)
Hemoglobin: 12 g/dL (ref 12.0–15.0)
Immature Granulocytes: 0 %
Lymphocytes Relative: 7 %
Lymphs Abs: 0.7 10*3/uL (ref 0.7–4.0)
MCH: 27.3 pg (ref 26.0–34.0)
MCHC: 30.8 g/dL (ref 30.0–36.0)
MCV: 88.6 fL (ref 80.0–100.0)
Monocytes Absolute: 0.7 10*3/uL (ref 0.1–1.0)
Monocytes Relative: 7 %
Neutro Abs: 7.8 10*3/uL — ABNORMAL HIGH (ref 1.7–7.7)
Neutrophils Relative %: 86 %
Platelets: 247 10*3/uL (ref 150–400)
RBC: 4.39 MIL/uL (ref 3.87–5.11)
RDW: 12.3 % (ref 11.5–15.5)
WBC: 9.2 10*3/uL (ref 4.0–10.5)
nRBC: 0 % (ref 0.0–0.2)

## 2020-06-05 LAB — COMPREHENSIVE METABOLIC PANEL
ALT: 19 U/L (ref 0–44)
AST: 19 U/L (ref 15–41)
Albumin: 4.1 g/dL (ref 3.5–5.0)
Alkaline Phosphatase: 66 U/L (ref 38–126)
Anion gap: 8 (ref 5–15)
BUN: 5 mg/dL — ABNORMAL LOW (ref 6–20)
CO2: 23 mmol/L (ref 22–32)
Calcium: 8.8 mg/dL — ABNORMAL LOW (ref 8.9–10.3)
Chloride: 109 mmol/L (ref 98–111)
Creatinine, Ser: 0.78 mg/dL (ref 0.44–1.00)
GFR, Estimated: >60 mL/min (ref >60)
Glucose, Bld: 83 mg/dL (ref 70–99)
Potassium: 3.6 mmol/L (ref 3.5–5.1)
Sodium: 140 mmol/L (ref 135–145)
Total Bilirubin: 0.7 mg/dL (ref 0.3–1.2)
Total Protein: 7.5 g/dL (ref 6.5–8.1)

## 2020-06-05 LAB — URINALYSIS, ROUTINE W REFLEX MICROSCOPIC
Bilirubin Urine: NEGATIVE
Glucose, UA: NEGATIVE mg/dL
Ketones, ur: 20 mg/dL — AB
Leukocytes,Ua: NEGATIVE
Nitrite: NEGATIVE
Protein, ur: NEGATIVE mg/dL
Specific Gravity, Urine: 1.012 (ref 1.005–1.030)
pH: 8 (ref 5.0–8.0)

## 2020-06-05 LAB — RESP PANEL BY RT-PCR (FLU A&B, COVID) ARPGX2
Influenza A by PCR: NEGATIVE
Influenza B by PCR: NEGATIVE
SARS Coronavirus 2 by RT PCR: NEGATIVE

## 2020-06-05 LAB — CBG MONITORING, ED: Glucose-Capillary: 71 mg/dL (ref 70–99)

## 2020-06-05 LAB — I-STAT BETA HCG BLOOD, ED (MC, WL, AP ONLY): I-stat hCG, quantitative: <5 m[IU]/mL (ref <5)

## 2020-06-05 MED ORDER — ZONISAMIDE 100 MG PO CAPS: 100.0000 mg | ORAL_CAPSULE | Freq: Every day | ORAL | 0 refills | Status: DC

## 2020-06-05 MED ORDER — SODIUM CHLORIDE 0.9 % IV BOLUS: 1000.0000 mL | Freq: Once | INTRAVENOUS | Status: DC

## 2020-06-05 MED ORDER — ACETAMINOPHEN 500 MG PO TABS
1000.0000 mg | ORAL_TABLET | Freq: Once | ORAL | Status: AC
  Administered 2020-06-05: 1000 mg via ORAL
  Filled 2020-06-05: qty 2

## 2020-06-05 MED ORDER — IBUPROFEN 200 MG PO TABS
600.0000 mg | ORAL_TABLET | Freq: Once | ORAL | Status: AC
  Administered 2020-06-05: 600 mg via ORAL
  Filled 2020-06-05: qty 3

## 2020-06-05 NOTE — ED Notes (Addendum)
Patient transported to x-ray. ?
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
## 2020-06-05 NOTE — ED Notes (Signed)
Patient heart rate when up to 144 . Cortini PA made aware. Currently HR 122

## 2020-06-05 NOTE — Discharge Instructions (Addendum)
I have sent a refill of the prescription for your seizure medication.  Please take as directed.  Please make an appointment to follow-up with your neurologist within the next 1 to 2 weeks for reassessment and return to the emergency department for any new or worsening symptoms in the meantime.

## 2020-06-05 NOTE — ED Triage Notes (Signed)
[facility]. Pt states that he has hx of seizures but is not taking meds now d/t breast feeding. Pt family reports that pt has seizures in her sleep. EMS reports that pt was confused on scene and agitated. EMS gave 5 IM haldol. Pt children were picked up by pts Aunt Toni Amend and [employer].  100HR, 96% RA, 120/90, CBG 90, 100.0 temp 20g L hand

## 2021-05-12 ENCOUNTER — Telehealth: Payer: Self-pay | Admitting: *Deleted

## 2021-05-12 NOTE — Telephone Encounter (Signed)
Received call from Natalie Lawrence. She is requesting an appointment Dr. Barbaraann Cao about her epilepsy and disability papers. She states she has frequent headaches as well.  Advised that she needs to connect with her neurologist whom she has not seen in over a year. Advised that she would need a referral to be sen by Dr. Barbaraann Cao and not for the purpose of disability paperwork. This would need to be addressed by her established providers.  Pt voiced understanding ?

## 2021-06-26 ENCOUNTER — Encounter: Payer: Self-pay | Admitting: Neurology

## 2021-06-26 ENCOUNTER — Ambulatory Visit (INDEPENDENT_AMBULATORY_CARE_PROVIDER_SITE_OTHER): Payer: Medicaid Other | Admitting: Neurology

## 2021-06-26 ENCOUNTER — Encounter: Payer: Self-pay | Admitting: Psychology

## 2021-06-26 VITALS — BP 131/87 | HR 67 | Ht 66.0 in | Wt 257.8 lb

## 2021-06-26 DIAGNOSIS — G40009 Localization-related (focal) (partial) idiopathic epilepsy and epileptic syndromes with seizures of localized onset, not intractable, without status epilepticus: Secondary | ICD-10-CM

## 2021-06-26 DIAGNOSIS — G43009 Migraine without aura, not intractable, without status migrainosus: Secondary | ICD-10-CM

## 2021-06-26 DIAGNOSIS — F4329 Adjustment disorder with other symptoms: Secondary | ICD-10-CM | POA: Diagnosis not present

## 2021-06-26 MED ORDER — TOPIRAMATE 25 MG PO TABS: ORAL_TABLET | ORAL | 5 refills | Status: DC

## 2021-06-26 NOTE — Progress Notes (Signed)
? ?NEUROLOGY FOLLOW UP OFFICE NOTE ? ?Natalie RobinsonVanessa D Lawrence ?[id number] ?[birth year] ? ?HISTORY OF PRESENT ILLNESS: ?I had the pleasure of seeing Natalie Lawrence in follow-up in the neurology clinic on 06/26/2021. She is accompanied by her significant other Natalie Lawrence who helps supplement the history today. 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 She was lost to follow-up, last communication in 2021 when she called our office to report a seizure and that she had stopped Zonisamide because it made her feel spaced out to take care of her 2 children. Records were reviewed. Since her last visit, she was in the ER in 02/2020 for suicide attempt from postpartum depression. [facility] when she had a seizure and was brought to the ER, confused and agitated. She had a fever from viral illness at that time and was given Zonisamide but states she did not take it and had been taking Keppra up until a month ago, she stopped it because she still felt spacey and felt she was having more seizures. ? ?They continue to report 1-2 seizures a month, heat and stress are the main triggers. She lives with Natalie Lawrence and their 2 children ages 2 and 4. Most of the seizures are in her sleep, Natalie Lawrence has seen her twitching which could progress to a convulsion where she bites her tongue or the inside of her mouth, confused after. He has only witnessed 2 convulsions in wakefulness, the last time was a long time ago. She does have "seizure-like feelings" during the day where she gets dizzy, hot, things are circling and she sees black and white dots. Her eyes go crazy trying to focus. She splashes water on her face and comes out of it, episodes last 1-2 hours. Natalie Lawrence also mentions that when it happens at night, it wakes him from sleep and he would throw water on her face and it would not progress to a convulsion, "if you cool her down quick enough, it kind of goes." She denies any focal  numbness/tingling/weakness, olfactory/gustatory hallucinations. She has occasional deja vu but separate from the seizure-like feelings. She continues to report frequent headaches occurring a couple of times a month, lasting for several days. Pain is "beyond 10/10," either affecting her whole head or behind her eyes where she could hardly keep her eyes open. She does not take any prn headache medication. She usually gets at least 6 hours of sleep. No alcohol use. She recently had an eye exam and is awaiting her new glasses, she notices things are blurred looking to the left. No pregnancy plans, she has had a tubal ligation. ? ? ?History on Initial Assessment 08/28/2019: The patient was seen as a virtual video visit on 08/28/2019. She has seen several neurologists, records have been reviewed and will be summarized as follows. She is a 31 year old right-handed woman with a history of seizures diagnosed at age 31. She states majority of her seizures are nocturnal. The first seizure occurred in 2015. She is noted to have convulsions with head version to the left. She would feel hot and cold flashes followed by dizziness prior to the convulsions. She has had only 2-3 seizures during wakefulness, last occurred a year ago. Sometimes she gets a warning up to 1 hour prior to the convulsions. She is confused and amnestic of events, sleepy after, with headaches and jaw pain. No focal weakness. She had [facility] from [facility], then Dr. Lucia GaskinsAhern  last 05/2019 on the third trimester of her pregnancy. On her visit with Dr. Lucia Gaskins, she was noted to have seizures in February, March, she reports a seizure at the end of May/beginning of June, then her last seizure was 08/18/19. Triggers include heat, stress, when she is overworked. She has occasional episodes where she feels very hot and dizzy like she will fall. She denies any olfactory/gustatory hallucinations, deja vu, rising epigastric sensation, focal  numbness/tingling/weakness, myoclonic jerks. She denies any staring/unresponsive episodes or gaps in time. She has tried Levetiracetam, Lamotrigine, carbamazepine, oxcarbazepine, phenytoin. She was last prescribed Levetiracetam during her visit with Dr. Lucia Gaskins in 05/2019, however she would start and stop it during her pregnancies, she is not taking seizure medication. She states it was "the 4th or 5th medication, the worst I've been on." She does not think it works, she feels spaced out and her depth perception would be different.  ? ?She has migraines 4-5 times a month, mostly on the right side with occasional nausea. She does not take any prn medications. She denies any diplopia, dysarthria/dysphagia, neck/back pain, bowel/bladder dysfunction. She feels her memory started to fade after seizures, she could not remember things from 2 days ago. She has 2 children, a 32 and a half year old and her 25 month old baby. She is breastfeeding and does not want to start any seizure medications at this time. She has no future pregnancy plans, she has had tubal ligation. ? ?Prior AEDs: Levetiracetam, Lamotrigine, oxcarbazepine, Phenytoin, carbamazepine, Zonisamide ? ?Diagnostic Data: ?EEGs: ?Routine EEG in 01/2016 showed diffuse beta activity, otherwise normal.  ?72-hour EEG in 12/2017 was abnormal due to right > left frontal epileptiform discharges, typical events not captured.  ? ?MRI: MRI brain with and without contrast 10/2019 normal, few small foci of T2 and FLAIR signal in the ?frontal white matter, a common finding. ? ? ?PAST MEDICAL HISTORY: ?Past Medical History:  ?Diagnosis Date  ? Epilepsy (HCC)   ? Epilepsy (HCC) 04/2013  ? GBS (group B Streptococcus carrier), +RV culture, currently pregnant 07/01/2019  ? Seizures (HCC)   ? ? ?MEDICATIONS: ?Current Outpatient Medications on File Prior to Visit  ?Medication Sig Dispense Refill  ? Prenatal Vit-Iron Carbonyl-FA (PRENATABS RX) 29-1 MG TABS Take 1 tablet by mouth daily. 30  tablet 11  ? zonisamide (ZONEGRAN) 100 MG capsule Take 1 capsule every night 30 capsule 5  ? zonisamide (ZONEGRAN) 100 MG capsule Take 1 capsule (100 mg total) by mouth at bedtime. 30 capsule 0  ? ?No current facility-administered medications on file prior to visit.  ? ? ?ALLERGIES: ?No Known Allergies ? ?FAMILY HISTORY: ?Family History  ?Problem Relation Age of Onset  ? Hypertension Paternal Grandmother   ? Diabetes Paternal Grandmother   ? Breast cancer Paternal Grandmother   ? Seizures Paternal Grandmother   ? Hypertension Paternal Grandfather   ? ? ?SOCIAL HISTORY: ?Social History  ? ?Socioeconomic History  ? Marital status: Single  ?  Spouse name: Not on file  ? Number of children: 4  ? Years of education: Not on file  ? Highest education level: High school graduate  ?Occupational History  ? Not on file  ?Tobacco Use  ? Smoking status: Former  ?  Types: Cigarettes  ? Smokeless tobacco: Never  ?Vaping Use  ? Vaping Use: Never used  ?Substance and Sexual Activity  ? Alcohol use: No  ? Drug use: No  ?  Comment: h/o smoking marijuana   ? Sexual activity: Yes  ?Other  Topics Concern  ? Not on file  ?Social History Narrative  ? Lives with boyfriend and children  ? Right handed  ? Caffeine: none  ? ?Social Determinants of Health  ? ?Financial Resource Strain: Not on file  ?Food Insecurity: Not on file  ?Transportation Needs: Not on file  ?Physical Activity: Not on file  ?Stress: Not on file  ?Social Connections: Not on file  ?Intimate Partner Violence: Not on file  ? ? ? ?PHYSICAL EXAM: ?Vitals:  ? 06/26/21 0834  ?BP: 131/87  ?Pulse: 67  ?SpO2: 99%  ? ?General: No acute distress ?Head:  Normocephalic/atraumatic ?Skin/Extremities: No rash, no edema ?Neurological Exam: alert and awake. No aphasia or dysarthria. Fund of knowledge is appropriate. Attention and concentration are normal.   Cranial nerves: Pupils equal, round. Extraocular movements intact with no nystagmus. Visual fields full.  No facial asymmetry.  Motor:  Bulk and tone normal, muscle strength 5/5 throughout with no pronator drift.   Finger to nose testing intact.  Gait narrow-based and steady, able to tandem walk adequately.  Romberg negative. ? ? ?IMPRESSION: ?This is a 31 yo RH woma
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
## 2021-07-02 ENCOUNTER — Telehealth: Payer: Self-pay | Admitting: Neurology

## 2021-07-02 NOTE — Telephone Encounter (Signed)
Natalie Lawrence or Natalie Lawrence: My 930 seizure patient on Monday(tomorrow) JUST saw Patrcia Dolly on Lawrence/24/2023, less than a week ago. Please cancel the appointment asap and tell her she needs to stay with Patrcia Dolly, I will not see her since she is seeing an epileptologist. Dr. Karel Jarvis is better for her care. I will not see her and she should not have 2 neurologists. Dr. Karel Jarvis is an epileptologist and best to treat her seizues, please cancel her appointment this morning ASAP please and note I will not see her in clinic. It is such a waste of resources to have 2 neurologists and see them a week apart. I referred her to Dr. Karel Jarvis in 2021 and told her this was best for her care.  ? ?Front desk: If she shows up, please tell her her appointment has been canceled due to the above thank you. And make a note I will NOT see her.I referred her to Patrcia Dolly, I will speak with angie about sending a letter to her about not returning to our clinic. thanks ?

## 2021-07-03 NOTE — Telephone Encounter (Addendum)
I called pt and relayed that due to her seeing Dr. Karel Jarvis (epileptologist) this last week, pt does not need 2 neurologists.  Dr. Lucia Gaskins would have her stay with Dr. Karel Jarvis whom seizures are her specialty.  No need to have 2 neurologists.  I will cancel  the appt. On 07-10-2021, pt was ok to  do.  Pt as appt with Dr. Karel Jarvis in August 2023. ?

## 2021-07-10 ENCOUNTER — Ambulatory Visit: Payer: Medicaid Other | Admitting: Neurology

## 2021-07-18 ENCOUNTER — Encounter: Payer: Self-pay | Admitting: Neurology

## 2021-07-25 ENCOUNTER — Telehealth: Payer: Self-pay | Admitting: Neurology

## 2021-07-25 NOTE — Telephone Encounter (Signed)
Patient advised to give the completion of the paperwork at least week. Per [employer] it should be done by the end of the week.   Patient verbalized understanding.

## 2021-07-25 NOTE — Telephone Encounter (Signed)
Patient called to see if the one page form she dropped off for court has been completed yet.

## 2021-08-31 ENCOUNTER — Telehealth: Payer: Self-pay | Admitting: Neurology

## 2021-08-31 NOTE — Telephone Encounter (Signed)
Pt called she is tolerating 2 tablets BID as long as she take it the same time everyday its no problems

## 2021-08-31 NOTE — Telephone Encounter (Signed)
Pt called in and left a message. She wants to speak with someone about how she should be increasing her seizure medication.

## 2021-08-31 NOTE — Telephone Encounter (Signed)
Glad to hear she is tolerating 2 tabs BID of Topamax. Pls ask how long she has been on the 2 tabs BID and when was the last sz. thanks

## 2021-08-31 NOTE — Telephone Encounter (Signed)
Pt stated that she has been on 2 tabs BID for a few weeks. Her last seizure was a couple weeks ago that was triggered under stress. She said that if it wasn't for the stress she thinks she wouldn't of had the seizure,  She stated that this is the best medication she has been on so far.

## 2021-09-01 NOTE — Telephone Encounter (Signed)
Noted  

## 2021-09-06 ENCOUNTER — Telehealth: Payer: Self-pay | Admitting: Neurology

## 2021-09-06 NOTE — Telephone Encounter (Signed)
Patient has not heard back from anyone regarding her forms for court. She would like to know whats going on with them and how much it would be.

## 2021-09-11 NOTE — Telephone Encounter (Signed)
Pt called in and left a message with the access nurse wanting to find out about court paperwork

## 2021-09-12 ENCOUNTER — Encounter: Payer: Self-pay | Admitting: Neurology

## 2021-09-12 NOTE — Telephone Encounter (Signed)
Per Dr. Karel Jarvis no fee for paperwork.  Called patient and informed her that her letter is ready. Patient will call be back with the fax number she is needing letter sent to.

## 2021-09-12 NOTE — Telephone Encounter (Signed)
Done, thanks

## 2021-09-12 NOTE — Telephone Encounter (Signed)
See other phone note

## 2021-09-13 NOTE — Telephone Encounter (Signed)
Called patient and informed her that her paperwork has been sent to fax number provided. Patient thanked me for the call.

## 2021-09-13 NOTE — Telephone Encounter (Signed)
Pt called back in stating she needed to give [place] a fax number. It is [phone]

## 2021-09-26 ENCOUNTER — Telehealth: Payer: Self-pay | Admitting: Neurology

## 2021-09-26 NOTE — Telephone Encounter (Signed)
Pt called in stating she has been dizzy, lightheaded with black and whit spots when she closed her eyes, weak, headache, and nauseous. It's been going on almost all day. Pt called in stating this is the third time this month she has been feeling this way. She is not sure if she might just be getting used to the seizure medicine? She isn't sure if she should stop taking her seizure medication until her upcoming appointment on 10/06/21? Or decrease it?

## 2021-09-27 NOTE — Telephone Encounter (Signed)
Pt called no answer left a voice mail to call the office back  °

## 2021-09-27 NOTE — Telephone Encounter (Signed)
If it is episodic, it is not her medication. Please do not stop medication, continue until f/u with me and we can discuss plan moving forward, thanks

## 2021-09-27 NOTE — Telephone Encounter (Signed)
Pt called an was informed that If it is episodic, it is not her medication. Please do not stop medication, continue until f/u with Dr Karel Jarvis and they can discuss plan moving forward

## 2021-10-06 ENCOUNTER — Ambulatory Visit (INDEPENDENT_AMBULATORY_CARE_PROVIDER_SITE_OTHER): Payer: Medicaid Other | Admitting: Neurology

## 2021-10-06 ENCOUNTER — Encounter: Payer: Self-pay | Admitting: Neurology

## 2021-10-06 VITALS — BP 114/81 | HR 69 | Wt 240.0 lb

## 2021-10-06 DIAGNOSIS — G43009 Migraine without aura, not intractable, without status migrainosus: Secondary | ICD-10-CM | POA: Diagnosis not present

## 2021-10-06 DIAGNOSIS — G40009 Localization-related (focal) (partial) idiopathic epilepsy and epileptic syndromes with seizures of localized onset, not intractable, without status epilepticus: Secondary | ICD-10-CM

## 2021-10-06 NOTE — Progress Notes (Signed)
NEUROLOGY FOLLOW UP OFFICE NOTE  Natalie Lawrence [id number] 07-10-1990  HISTORY OF PRESENT ILLNESS: I had the pleasure of seeing Natalie Lawrence in follow-up in the neurology clinic on 10/06/2021.  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 She is alone in the office today. Records and images were personally reviewed where available.  She had side effects on Zonisamide and was started on low dose Topiramate for seizure and migraine prophylaxis. She is currently on 50mg  BID and overall tolerating it. She has noticed tingling in her face and hands. She has had 3 episodes where she feels dizzy, lightheaded with white and black spots when she closes her eyes, weakness, headache, nausea. The first time it happened, she felt it hard to move around, almost like getting up after a seizure. The second time she was more aware of it and was able to fight the feeling of weakness. Both lasted the whole day. The third time lasted half a day. She tries to keep hydrated. Her last seizure was a couple of weeks ago when she had a convulsion with tongue bite. A week before that, she had the "seizure-like feelings." She recalls 2 seizures in July, 1 in June. The Topiramate may be helping some with her migraines as well, she has around 3 a week at minimum but for the most part can tolerate them, lasting a few hours. She denies any falls. She gets at least 5 hours of sleep. Mood depends on the day, depends on her anxiety level that day. She has a lot of anxiety and sees a therapist, but would like to see a Psychiatrist for medication management.    History on Initial Assessment 08/28/2019: The patient was seen as a virtual video visit on 08/28/2019. She has seen several neurologists, records have been reviewed and will be summarized as follows. She is a 31 year old right-handed woman with a history of seizures diagnosed at age 67. She states majority of her seizures are nocturnal. The first seizure  occurred in 2015. She is noted to have convulsions with head version to the left. She would feel hot and cold flashes followed by dizziness prior to the convulsions. She has had only 2-3 seizures during wakefulness, last occurred a year ago. Sometimes she gets a warning up to 1 hour prior to the convulsions. She is confused and amnestic of events, sleepy after, with headaches and jaw pain. No focal weakness. She had been seeing Dr. 2016 at [facility] from 2018-2019, then Dr. 09-18-1974 last 05/2019 on the third trimester of her pregnancy. On her visit with Dr. 06/2019, she was noted to have seizures in February, March, she reports a seizure at the end of May/beginning of June, then her last seizure was 08/18/19. Triggers include heat, stress, when she is overworked. She has occasional episodes where she feels very hot and dizzy like she will fall. She denies any olfactory/gustatory hallucinations, deja vu, rising epigastric sensation, focal numbness/tingling/weakness, myoclonic jerks. She denies any staring/unresponsive episodes or gaps in time. She has tried Levetiracetam, Lamotrigine, carbamazepine, oxcarbazepine, phenytoin. She was last prescribed Levetiracetam during her visit with Dr. 08/20/19 in 05/2019, however she would start and stop it during her pregnancies, she is not taking seizure medication. She states it was "the 4th or 5th medication, the worst I've been on." She does not think it works, she feels spaced out and her depth perception would be different.   She has migraines 4-5 times a month, mostly  on the right side with occasional nausea. She does not take any prn medications. She denies any diplopia, dysarthria/dysphagia, neck/back pain, bowel/bladder dysfunction. She feels her memory started to fade after seizures, she could not remember things from 2 days ago. She has 2 children, a 22 and a half year old and her 76 month old baby. She is breastfeeding and does not want to start any seizure medications  at this time. She has no future pregnancy plans, she has had tubal ligation.  Prior AEDs: Levetiracetam, Lamotrigine, oxcarbazepine, Phenytoin, carbamazepine, Zonisamide  Diagnostic Data: EEGs: Routine EEG in 01/2016 showed diffuse beta activity, otherwise normal.  72-hour EEG in 12/2017 was abnormal due to right > left frontal epileptiform discharges, typical events not captured.   MRI: MRI brain with and without contrast 10/2019 normal, few small foci of T2 and FLAIR signal in the frontal white matter, a common finding.   PAST MEDICAL HISTORY: Past Medical History:  Diagnosis Date   Epilepsy ([place])    Epilepsy ([place]) 04/2013   GBS (group B Streptococcus carrier), +RV culture, currently pregnant 07/01/2019   Seizures ([place])     MEDICATIONS: Current Outpatient Medications on File Prior to Visit  Medication Sig Dispense Refill   topiramate (TOPAMAX) 25 MG tablet Take 1 tablet twice a day for 1 week, then increase to 2 tablets twice a day 120 tablet 5   Prenatal Vit-Iron Carbonyl-FA (PRENATABS RX) 29-1 MG TABS Take 1 tablet by mouth daily. (Patient not taking: Reported on 10/06/2021) 30 tablet 11   zonisamide (ZONEGRAN) 100 MG capsule Take 1 capsule every night (Patient not taking: Reported on 10/06/2021) 30 capsule 5   zonisamide (ZONEGRAN) 100 MG capsule Take 1 capsule (100 mg total) by mouth at bedtime. (Patient not taking: Reported on 10/06/2021) 30 capsule 0   No current facility-administered medications on file prior to visit.    ALLERGIES: No Known Allergies  FAMILY HISTORY: Family History  Problem Relation Age of Onset   Hypertension Paternal Grandmother    Diabetes Paternal Grandmother    Breast cancer Paternal Grandmother    Seizures Paternal Grandmother    Hypertension Paternal Grandfather     SOCIAL HISTORY: Social History   Socioeconomic History   Marital status: Single    Spouse name: Not on file   Number of children: 4   Years of education: Not on file    Highest education level: High school graduate  Occupational History   Not on file  Tobacco Use   Smoking status: Former    Types: Cigarettes   Smokeless tobacco: Never  Vaping Use   Vaping Use: Never used  Substance and Sexual Activity   Alcohol use: No   Drug use: No    Comment: h/o smoking marijuana    Sexual activity: Yes  Other Topics Concern   Not on file  Social History Narrative   Lives with boyfriend and children in a one story home   Right handed   Caffeine: none   Social Determinants of Health   Financial Resource Strain: Not on file  Food Insecurity: No Food Insecurity (06/29/2019)   Hunger Vital Sign    Worried About Running Out of Food in the Last Year: Never true    Ran Out of Food in the Last Year: Never true  Recent Concern: Food Insecurity - Food Insecurity Present (05/19/2019)   Hunger Vital Sign    Worried About Running Out of Food in the Last Year: Often true    Ran Out  of Food in the Last Year: Often true  Transportation Needs: No Transportation Needs (06/29/2019)   PRAPARE - Administrator, Civil Service (Medical): No    Lack of Transportation (Non-Medical): No  Physical Activity: Not on file  Stress: Not on file  Social Connections: Not on file  Intimate Partner Violence: Not At Risk (05/19/2019)   Humiliation, Afraid, Rape, and Kick questionnaire    Fear of Current or Ex-Partner: No    Emotionally Abused: No    Physically Abused: No    Sexually Abused: No     PHYSICAL EXAM: Vitals:   10/06/21 1145  BP: 114/81  Pulse: 69  SpO2: 99%   General: No acute distress Head:  Normocephalic/atraumatic Skin/Extremities: No rash, no edema Neurological Exam: alert and oriented to person, place, and time. No aphasia or dysarthria. Fund of knowledge is appropriate.  Recent and remote memory are intact.  Attention and concentration are normal.   Cranial nerves: Pupils equal, round. Extraocular movements intact with no nystagmus. Visual fields  full.  No facial asymmetry.  Motor: Bulk and tone normal, muscle strength 5/5 throughout with no pronator drift.   Finger to nose testing intact.  Gait narrow-based and steady, able to tandem walk adequately.  Romberg negative.   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 Prior prolonged EEG reported right > left frontal epileptiform discharges. MRI brain no acute changes. Last convulsion was a couple of weeks ago. She has migraines around 3 times a week. She appears to be tolerating Topiramate better compared to prior ASMs, we discussed continued slow uptitration as tolerated.  Increase Topiramate to 75mg  BID until she finishes her current bottle, then increase to 100mg  BID. She was advised to keep a calendar of her seizures and migraines. Increase hydration. She is aware of [place] driving laws to stop driving after a seizure until 6 months seizure-free. She is looking for a psychiatrist for anxiety medication management, information for [facility] provided. Follow-up in 3-4 months, call for any changes.    Thank you for allowing me to participate in her care.  Please do not hesitate to call for any questions or concerns.    , M.D.

## 2021-10-06 NOTE — Patient Instructions (Signed)
Good to see you.  Increase Topamax: With your current bottle of Topiramate 25mg : Take 3 tablets twice a day. Once done, your new bottle will be for Topiramate 100mg : Take 1 tablet twice a day  2. [facility] Phone: [phone]  [address] [place], [place]  3. Keep a calendar of your seizures and headaches. Follow-up in 3-4 months, call for any changes   Seizure Precautions: 1. If medication has been prescribed for you to prevent seizures, take it exactly as directed.  Do not stop taking the medicine without talking to your doctor first, even if you have not had a seizure in a long time.   2. Avoid activities in which a seizure would cause danger to yourself or to others.  Don't operate dangerous machinery, swim alone, or climb in high or dangerous places, such as on ladders, roofs, or girders.  Do not drive unless your doctor says you may.  3. If you have any warning that you may have a seizure, lay down in a safe place where you can't hurt yourself.    4.  No driving for 6 months from last seizure, as per [facility].   Please refer to the following link on the Epilepsy Foundation of America's website for more information: http://www.epilepsyfoundation.org/answerplace/Social/driving/drivingu.cfm   5.  Maintain good sleep hygiene. Avoid alcohol.  6.  Contact your doctor if you have any problems that may be related to the medicine you are taking.  7.  Call 911 and bring the patient back to the ED if:        A.  The seizure lasts longer than 5 minutes.       B.  The patient doesn't awaken shortly after the seizure  C.  The patient has new problems such as difficulty seeing, speaking or moving  D.  The patient was injured during the seizure  E.  The patient has a temperature over 102 F (39C)  F.  The patient vomited and now is having trouble breathing

## 2021-10-09 ENCOUNTER — Telehealth: Payer: Self-pay | Admitting: Neurology

## 2021-10-09 NOTE — Telephone Encounter (Signed)
Noted, thanks!

## 2021-10-09 NOTE — Telephone Encounter (Signed)
Patient called to give Dr Karel Jarvis the dates of her seizures. 5/18 seizure 6/13 seizure 6/22 seizure/ fell 7/6 seizure/fell out of bed 7/18 seizure/fell

## 2021-10-17 ENCOUNTER — Other Ambulatory Visit: Payer: Self-pay

## 2021-10-17 ENCOUNTER — Telehealth: Payer: Self-pay | Admitting: Neurology

## 2021-10-17 DIAGNOSIS — G43009 Migraine without aura, not intractable, without status migrainosus: Secondary | ICD-10-CM

## 2021-10-17 DIAGNOSIS — G40009 Localization-related (focal) (partial) idiopathic epilepsy and epileptic syndromes with seizures of localized onset, not intractable, without status epilepticus: Secondary | ICD-10-CM

## 2021-10-17 MED ORDER — TOPIRAMATE 100 MG PO TABS: 100.0000 mg | ORAL_TABLET | Freq: Two times a day (BID) | ORAL | 1 refills | Status: DC

## 2021-10-17 NOTE — Telephone Encounter (Signed)
1. Which medications need refilled? (List name and dosage, if known) topamax 100 mg  2. Which pharmacy/location is medication to be sent to? (include street and city if local pharmacy) [facility]  in [place]  Pharmacy doesn't show Dr. Karel Jarvis sending in prescription from her last visit on 10/06/21. Today is her last dose.

## 2021-10-17 NOTE — Telephone Encounter (Signed)
Sent in prescription and called patient  

## 2021-11-07 ENCOUNTER — Telehealth: Payer: Self-pay | Admitting: Neurology

## 2021-11-07 NOTE — Telephone Encounter (Signed)
Is the tingling tolerable? If yes, would stay on same dose, thanks

## 2021-11-07 NOTE — Telephone Encounter (Signed)
Patient still has tingling in her face after increasing her medication.  She did wake up with some blood on her pillow in the last week.

## 2021-11-09 NOTE — Telephone Encounter (Signed)
If this is not how her seizures started in the past and the tingling started after she started the Topamax, then it is from medication and likely not seizure. Thanks

## 2021-11-09 NOTE — Telephone Encounter (Signed)
Patient said tingling is tolerable but she did want to know if this is a sign of a seizure getting ready to happen the tingling

## 2021-11-10 NOTE — Telephone Encounter (Signed)
Called patient and let her know Dr. Karel Jarvis recommendation on her

## 2021-12-14 ENCOUNTER — Encounter: Payer: Medicaid Other | Attending: Psychology | Admitting: Psychology

## 2022-01-11 ENCOUNTER — Ambulatory Visit (INDEPENDENT_AMBULATORY_CARE_PROVIDER_SITE_OTHER): Payer: Self-pay | Admitting: Neurology

## 2022-01-11 ENCOUNTER — Encounter: Payer: Self-pay | Admitting: Neurology

## 2022-01-11 DIAGNOSIS — G40009 Localization-related (focal) (partial) idiopathic epilepsy and epileptic syndromes with seizures of localized onset, not intractable, without status epilepticus: Secondary | ICD-10-CM

## 2022-01-11 DIAGNOSIS — G43009 Migraine without aura, not intractable, without status migrainosus: Secondary | ICD-10-CM

## 2022-01-11 MED ORDER — TOPIRAMATE 100 MG PO TABS: 100.0000 mg | ORAL_TABLET | Freq: Two times a day (BID) | ORAL | 1 refills | Status: DC

## 2022-01-11 MED ORDER — LORAZEPAM 0.5 MG PO TABS: ORAL_TABLET | ORAL | 5 refills | Status: DC

## 2022-01-11 NOTE — Progress Notes (Signed)
NEUROLOGY FOLLOW UP OFFICE NOTE  Natalie Lawrence [id number] Jul 12, 1990  HISTORY OF PRESENT ILLNESS: I had the pleasure of seeing Natalie Lawrence in follow-up in the neurology clinic on 01/11/2022.  The patient was last seen 3 months ago for temporal lobe epilepsy. She is alone in the office today. Records and images were personally reviewed where available. On her last visit, Topiramate was increased to 100mg  BID. She has tingling in her face and hands with some nausea since increasing dose, but overall tolerable. She brings a calendar of her symptoms. She had a couple of seizures with falls in September. On 10/5, she had seizure-like feeling with a headache during the day. She felt dizzy, facial tingling, hot and cold flashes, some nausea. When she feels this, people say she is really pale or red and she drinks water. That evening, she had a nocturnal seizure and woke up with tongue bite/blood on her pillow. On 10/15, she had seizure-like feelings and almost fell. On 10/26, she had seizure-like feelings lasting 3 days. On 11/7, she had seizure-like feeling that continues to today, she almost fell. She notes that when she smokes marijuana, all these symptoms go away. She also adds that sometimes family members notice that she curves/veers when walking.    History on Initial Assessment 08/28/2019: The patient was seen as a virtual video visit on 08/28/2019. She has seen several neurologists, records have been reviewed and will be summarized as follows. She is a 31 year old right-handed woman with a history of seizures diagnosed at age 73. She states majority of her seizures are nocturnal. The first seizure occurred in 2015. She is noted to have convulsions with head version to the left. She would feel hot and cold flashes followed by dizziness prior to the convulsions. She has had only 2-3 seizures during wakefulness, last occurred a year ago. Sometimes she gets a warning up to 1 hour prior to the  convulsions. She is confused and amnestic of events, sleepy after, with headaches and jaw pain. No focal weakness. She had been seeing Dr. 2016 at [facility] from 2018-2019, then Dr. 09-18-1974 last 05/2019 on the third trimester of her pregnancy. On her visit with Dr. 06/2019, she was noted to have seizures in February, March, she reports a seizure at the end of May/beginning of June, then her last seizure was 08/18/19. Triggers include heat, stress, when she is overworked. She has occasional episodes where she feels very hot and dizzy like she will fall. She denies any olfactory/gustatory hallucinations, deja vu, rising epigastric sensation, focal numbness/tingling/weakness, myoclonic jerks. She denies any staring/unresponsive episodes or gaps in time. She has tried Levetiracetam, Lamotrigine, carbamazepine, oxcarbazepine, phenytoin. She was last prescribed Levetiracetam during her visit with Dr. 08/20/19 in 05/2019, however she would start and stop it during her pregnancies, she is not taking seizure medication. She states it was "the 4th or 5th medication, the worst I've been on." She does not think it works, she feels spaced out and her depth perception would be different.   She has migraines 4-5 times a month, mostly on the right side with occasional nausea. She does not take any prn medications. She denies any diplopia, dysarthria/dysphagia, neck/back pain, bowel/bladder dysfunction. She feels her memory started to fade after seizures, she could not remember things from 2 days ago. She has 2 children, a 68 and a half year old and her 102 month old baby. She is breastfeeding and does not want to start any seizure medications  at this time. She has no future pregnancy plans, she has had tubal ligation.  Prior AEDs: Levetiracetam, Lamotrigine, oxcarbazepine, Phenytoin, carbamazepine, Zonisamide  Diagnostic Data: EEGs: Routine EEG in 01/2016 showed diffuse beta activity, otherwise normal.  72-hour EEG in 12/2017  was abnormal due to right > left frontal epileptiform discharges, typical events not captured.   MRI: MRI brain with and without contrast 10/2019 normal, few small foci of T2 and FLAIR signal in the frontal white matter, a common finding.   PAST MEDICAL HISTORY: Past Medical History:  Diagnosis Date   Epilepsy ([facility])    Epilepsy ([facility]) 04/2013   GBS (group B Streptococcus carrier), +RV culture, currently pregnant 07/01/2019   Seizures ([facility])     MEDICATIONS: Current Outpatient Medications on File Prior to Visit  Medication Sig Dispense Refill   topiramate (TOPAMAX) 100 MG tablet Take 1 tablet (100 mg total) by mouth 2 (two) times daily. 100mg : Take 1 tablet twice a day 180 tablet 1   No current facility-administered medications on file prior to visit.    ALLERGIES: No Known Allergies  FAMILY HISTORY: Family History  Problem Relation Age of Onset   Hypertension Paternal Grandmother    Diabetes Paternal Grandmother    Breast cancer Paternal Grandmother    Seizures Paternal Grandmother    Hypertension Paternal Grandfather     SOCIAL HISTORY: Social History   Socioeconomic History   Marital status: Single    Spouse name: Not on file   Number of children: 4   Years of education: Not on file   Highest education level: High school graduate  Occupational History   Not on file  Tobacco Use   Smoking status: Former    Types: Cigarettes   Smokeless tobacco: Never  Vaping Use   Vaping Use: Never used  Substance and Sexual Activity   Alcohol use: No   Drug use: No    Comment: h/o smoking marijuana    Sexual activity: Yes  Other Topics Concern   Not on file  Social History Narrative   Lives with boyfriend and children in a one story home   Right handed   Caffeine: none   Social Determinants of Health   Financial Resource Strain: Not on file  Food Insecurity: No Food Insecurity (06/29/2019)   Hunger Vital Sign    Worried About Running Out of Food in the Last Year:  Never true    Ran Out of Food in the Last Year: Never true  Recent Concern: Food Insecurity - Food Insecurity Present (05/19/2019)   Hunger Vital Sign    Worried About Running Out of Food in the Last Year: Often true    Ran Out of Food in the Last Year: Often true  Transportation Needs: No Transportation Needs (06/29/2019)   PRAPARE - 07/01/2019 (Medical): No    Lack of Transportation (Non-Medical): No  Physical Activity: Not on file  Stress: Not on file  Social Connections: Not on file  Intimate Partner Violence: Not At Risk (05/19/2019)   Humiliation, Afraid, Rape, and Kick questionnaire    Fear of Current or Ex-Partner: No    Emotionally Abused: No    Physically Abused: No    Sexually Abused: No     PHYSICAL EXAM: Vitals:   01/11/22 1138  BP: 120/84  Pulse: 89  SpO2: 99%   General: No acute distress Head:  Normocephalic/atraumatic Skin/Extremities: No rash, no edema Neurological Exam: alert and awake. No aphasia or dysarthria. Fund  of knowledge is appropriate.  Attention and concentration are normal.   Cranial nerves: Pupils equal, round. Extraocular movements intact with no nystagmus. Visual fields full.  No facial asymmetry.  Motor: Bulk and tone normal, muscle strength 5/5 throughout with no pronator drift.   Finger to nose testing intact.  Gait narrow-based and steady, able to tandem walk adequately.  Romberg negative.   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 Prior prolonged EEG reported right > left frontal epileptiform discharges. MRI brain no acute changes. She continues to report 1-2 seizures a month, last nocturnal convulsion was  a month ago. She is also reporting "seizure-like feelings" that last for prolonged periods of time, that appear to improve when she smokes marijuana. I discussed with her that anxiety may be part of these symptoms due to this  response. She notes they are similar to pre-seizure symptoms and did have a seizure later in the evening after one of them. We discussed prn lorazepam 0.5mg  as needed for these seizure-like symptoms, side effects discussed. Continue Topiramate 100mg  BID. We discussed Epidiolex for seizure prophylaxis, side effects discussed. She will need LFT monitoring, check CBC, CMP today. She is aware of [place] driving laws to stop driving after a seizure until 6 months seizure-free. Follow-up in 3 months, call for any changes.    Thank you for allowing me to participate in her care.  Please do not hesitate to call for any questions or concerns.    , M.D.

## 2022-01-11 NOTE — Patient Instructions (Signed)
Good to see you.  Continue Topiramate 100mg  twice a day  2. Try taking the lorazepam 0.5mg  tablet as needed for the seizure-like feelings  3. Have bloodwork done for CBC, CMP. We will work on getting into the Patient Assistance Program for Epidiolex  4. Continue seizure calendar  5. Follow-up in 3 months, call for any changes   Seizure Precautions: 1. If medication has been prescribed for you to prevent seizures, take it exactly as directed.  Do not stop taking the medicine without talking to your doctor first, even if you have not had a seizure in a long time.   2. Avoid activities in which a seizure would cause danger to yourself or to others.  Don't operate dangerous machinery, swim alone, or climb in high or dangerous places, such as on ladders, roofs, or girders.  Do not drive unless your doctor says you may.  3. If you have any warning that you may have a seizure, lay down in a safe place where you can't hurt yourself.    4.  No driving for 6 months from last seizure, as per [facility].   Please refer to the following link on the [employer]'s website for more information: http://www.epilepsyfoundation.org/answerplace/Social/driving/drivingu.cfm   5.  Maintain good sleep hygiene. Avoid alcohol.  6.  Notify your neurology if you are planning pregnancy or if you become pregnant.  7.  Contact your doctor if you have any problems that may be related to the medicine you are taking.  8.  Call 911 and bring the patient back to the ED if:        A.  The seizure lasts longer than 5 minutes.       B.  The patient doesn't awaken shortly after the seizure  C.  The patient has new problems such as difficulty seeing, speaking or moving  D.  The patient was injured during the seizure  E.  The patient has a temperature over 102 F (39C)  F.  The patient vomited and now is having trouble breathing

## 2022-01-12 ENCOUNTER — Other Ambulatory Visit (HOSPITAL_COMMUNITY): Payer: Self-pay

## 2022-01-15 ENCOUNTER — Other Ambulatory Visit (HOSPITAL_COMMUNITY): Payer: Self-pay

## 2022-01-22 ENCOUNTER — Telehealth: Payer: Self-pay | Admitting: Neurology

## 2022-01-22 NOTE — Telephone Encounter (Signed)
Pt called in stating she was told to call and give Dr. Karel Jarvis the dates she has had seizures. 10/19/21, 11/08/21, and 11/25/21.

## 2022-01-22 NOTE — Telephone Encounter (Signed)
Noted  

## 2022-02-04 IMAGING — CR DG CHEST 2V
2 series · 2 of 2 positions shown · non-contrast
Comparison: Radiographs 05/17/2019.

CLINICAL DATA: Fever.  Seizure.

EXAM:
CHEST - 2 VIEW

[w chest lat]
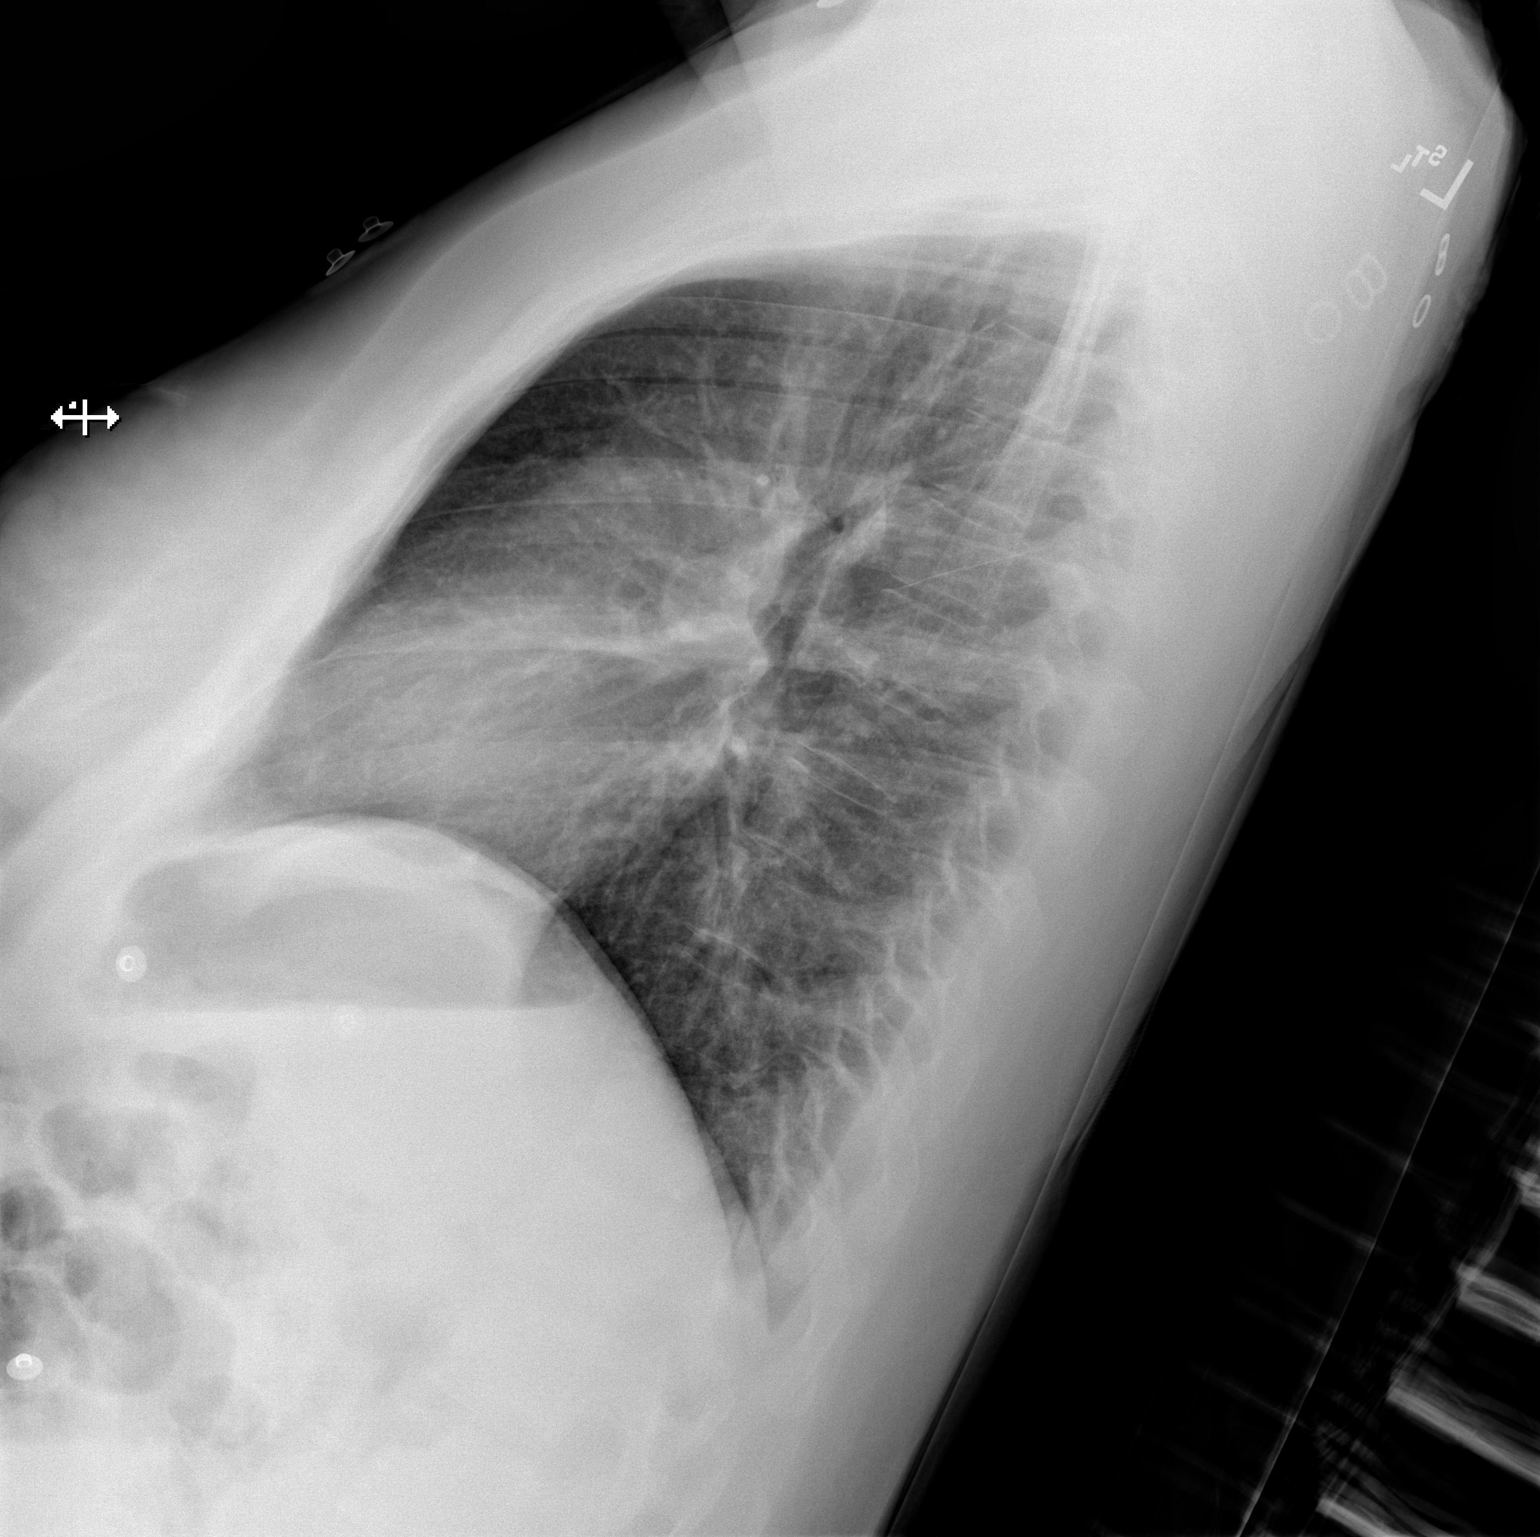

[x chest ap]
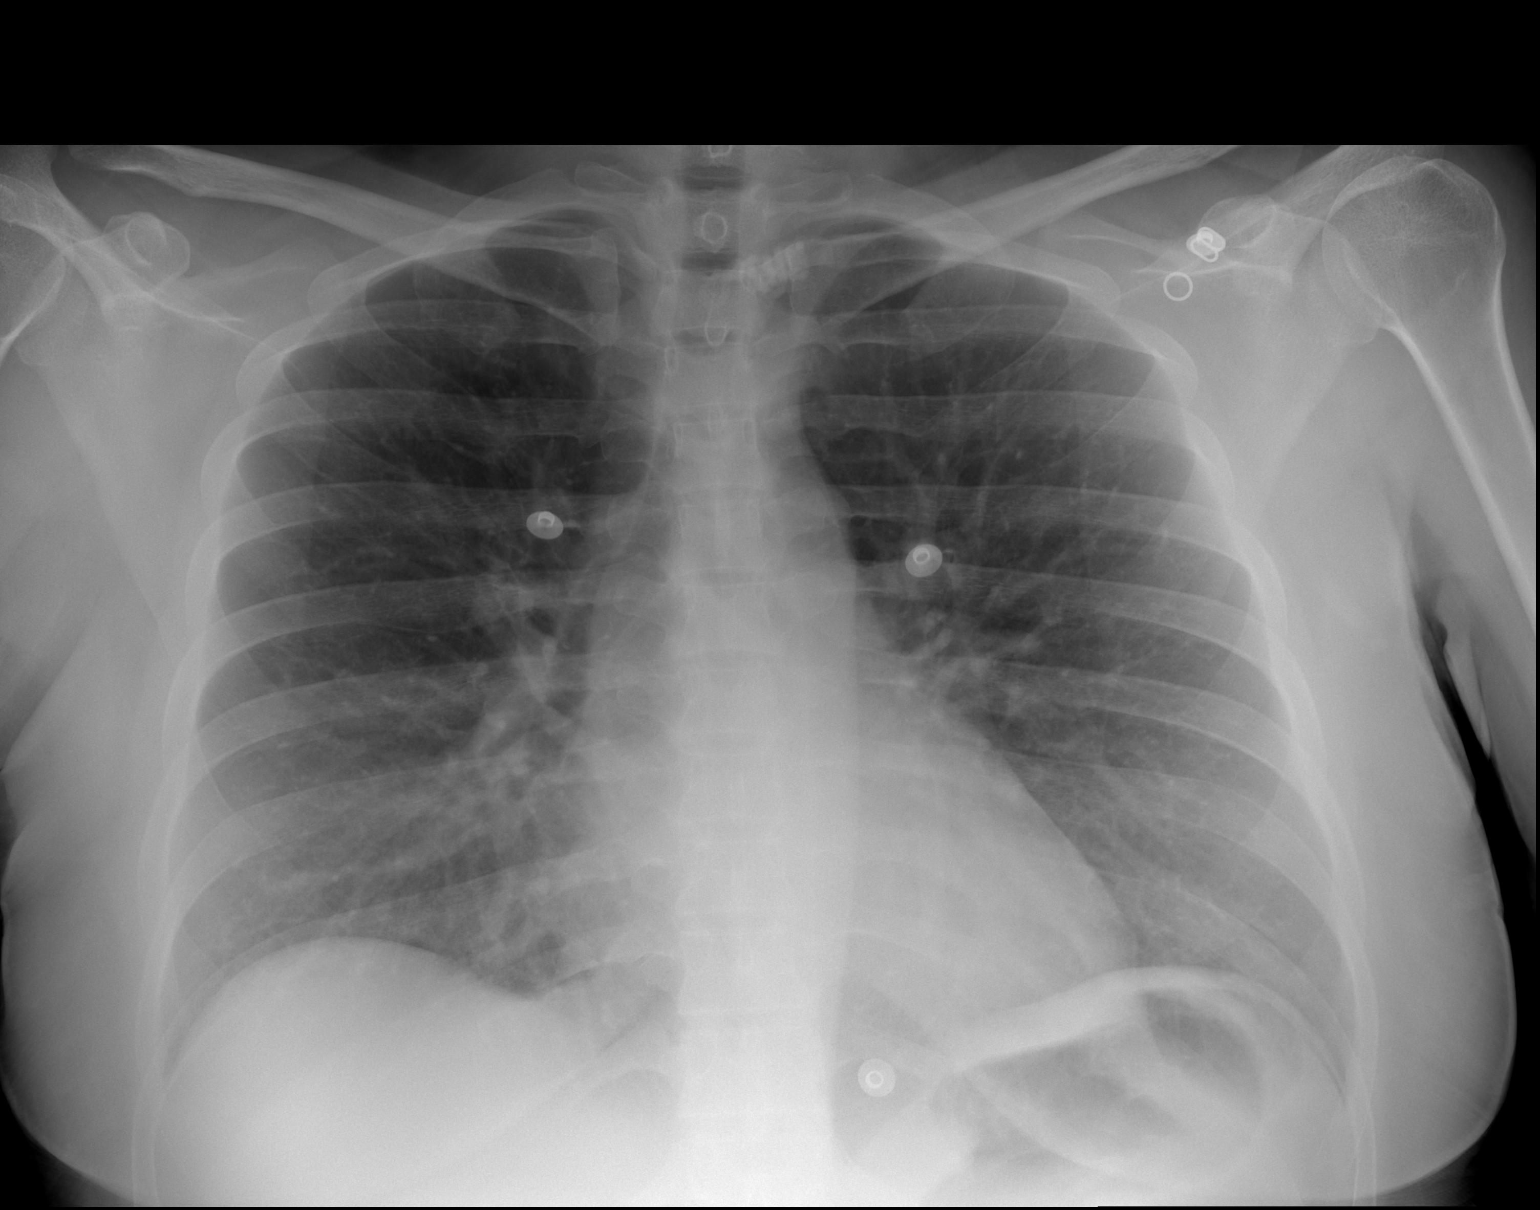

[2 of 2 positions shown; findings below may reference images not displayed]

FINDINGS: The heart size and mediastinal contours are normal. The lungs are
clear. There is no pleural effusion or pneumothorax. No acute
osseous findings are identified. Radiodensity projecting over the
left sternoclavicular joint on the AP view is not seen on the
lateral view, although is presumably overlying the patient.
IMPRESSION: Stable chest.  No active cardiopulmonary process.

## 2022-03-21 ENCOUNTER — Encounter: Payer: Self-pay | Admitting: Neurology

## 2022-04-02 ENCOUNTER — Encounter: Payer: Self-pay | Admitting: Neurology

## 2022-04-03 MED ORDER — RIZATRIPTAN BENZOATE 10 MG PO TABS: ORAL_TABLET | ORAL | 6 refills | Status: DC

## 2022-05-14 ENCOUNTER — Other Ambulatory Visit (INDEPENDENT_AMBULATORY_CARE_PROVIDER_SITE_OTHER): Payer: Medicaid Other

## 2022-05-14 ENCOUNTER — Encounter: Payer: Self-pay | Admitting: Neurology

## 2022-05-14 ENCOUNTER — Ambulatory Visit (INDEPENDENT_AMBULATORY_CARE_PROVIDER_SITE_OTHER): Payer: Medicaid Other | Admitting: Neurology

## 2022-05-14 ENCOUNTER — Telehealth: Payer: Self-pay

## 2022-05-14 VITALS — BP 125/77 | HR 73 | Ht 66.0 in | Wt 217.0 lb

## 2022-05-14 DIAGNOSIS — G43009 Migraine without aura, not intractable, without status migrainosus: Secondary | ICD-10-CM

## 2022-05-14 DIAGNOSIS — G40009 Localization-related (focal) (partial) idiopathic epilepsy and epileptic syndromes with seizures of localized onset, not intractable, without status epilepticus: Secondary | ICD-10-CM

## 2022-05-14 LAB — COMPREHENSIVE METABOLIC PANEL
ALT: 9 U/L (ref 0–35)
AST: 15 U/L (ref 0–37)
Albumin: 4.1 g/dL (ref 3.5–5.2)
Alkaline Phosphatase: 60 U/L (ref 39–117)
BUN: 9 mg/dL (ref 6–23)
CO2: 25 mEq/L (ref 19–32)
Calcium: 9.7 mg/dL (ref 8.4–10.5)
Chloride: 107 mEq/L (ref 96–112)
Creatinine, Ser: 0.94 mg/dL (ref 0.40–1.20)
GFR: 79.8 mL/min (ref >60.00)
Glucose, Bld: 95 mg/dL (ref 70–99)
Potassium: 4.5 mEq/L (ref 3.5–5.1)
Sodium: 137 mEq/L (ref 135–145)
Total Bilirubin: 0.3 mg/dL (ref 0.2–1.2)
Total Protein: 7.7 g/dL (ref 6.0–8.3)

## 2022-05-14 LAB — CBC
HCT: 41.1 % (ref 36.0–46.0)
Hemoglobin: 13 g/dL (ref 12.0–15.0)
MCHC: 31.6 g/dL (ref 30.0–36.0)
MCV: 86.8 fl (ref 78.0–100.0)
Platelets: 311 10*3/uL (ref 150.0–400.0)
RBC: 4.74 Mil/uL (ref 3.87–5.11)
RDW: 13.7 % (ref 11.5–15.5)
WBC: 12.9 10*3/uL — ABNORMAL HIGH (ref 4.0–10.5)

## 2022-05-14 MED ORDER — RIZATRIPTAN BENZOATE 10 MG PO TABS: ORAL_TABLET | ORAL | 6 refills | Status: DC

## 2022-05-14 MED ORDER — TOPIRAMATE 100 MG PO TABS: 100.0000 mg | ORAL_TABLET | Freq: Two times a day (BID) | ORAL | 1 refills | Status: DC

## 2022-05-14 MED ORDER — XCOPRI 50 MG PO TABS: ORAL_TABLET | ORAL | 5 refills | Status: DC

## 2022-05-14 NOTE — Telephone Encounter (Signed)
Natalie Lawrence Samples Medication Samples have been provided to the patient.  Drug name: xcopri       Strength: 12.'5mg'$ /'25mg'$         Qty: 28 days LOT: [id number]  Exp.Date: 2024/sep  Dosing instructions:  After finishing Xcopri 12.'5mg'$  and '25mg'$  samples, start Xcopri '50mg'$ : 1 tablet every night   The patient has been instructed regarding the correct time, dose, and frequency of taking this medication, including desired effects and most common side effects.   Renae Gloss 1:57 PM 05/14/2022

## 2022-05-14 NOTE — Progress Notes (Signed)
NEUROLOGY FOLLOW UP OFFICE NOTE  AKYLA Lawrence [id number] 12-06-1990  HISTORY OF PRESENT ILLNESS: I had the pleasure of seeing Natalie Lawrence in follow-up in the neurology clinic on 05/14/2022.  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 She is alone in the office today. Records and images were personally reviewed where available.  Since her last visit, she reports 30-second nocturnal seizures on 11/23, 1/5, 1/17, 2/15. Yesterday she had a seizure in wakefulness, there was no prior warning, it lasted over a minute and her husband said it was worse and longest one yet. He said the right arm was flexed over her chest and she turned to one side, fell off the bleachers. She bit both sides of her tongue and inside her cheek.Both sides of her neck initially hurt, now it is mostly her left side but her whole body is sore. She is on Topiramate '100mg'$  BID and does not think it is good, she is not eating, she has to force herself to eat. She has prn lorazepam for the "seizure-like feelings" and has only used it one time, no side effects. She has migraines almost every other day. She has not been able to obtain the Maxalt yet due to cost. She reports difficulty with sleep initiation, getting 4-5 hours of sleep. She is not breastfeeding.    History on Initial Assessment 08/28/2019: The patient was seen as a virtual video visit on 08/28/2019. She has seen several neurologists, records have been reviewed and will be summarized as follows. She is a 32 year old right-handed woman with a history of seizures diagnosed at age 46. She states majority of her seizures are nocturnal. The first seizure occurred in 2015. She is noted to have convulsions with head version to the left. She would feel hot and cold flashes followed by dizziness prior to the convulsions. She has had only 2-3 seizures during wakefulness, last occurred a year ago. Sometimes she gets a warning up to 1 hour prior to the  convulsions. She is confused and amnestic of events, sleepy after, with headaches and jaw pain. No focal weakness. She had been seeing Dr. Manuella Ghazi at [facility] from 2018-2019, then Dr. Jaynee Eagles last 05/2019 on the third trimester of her pregnancy. On her visit with Dr. Jaynee Eagles, she was noted to have seizures in February, March, she reports a seizure at the end of May/beginning of June, then her last seizure was 08/18/19. Triggers include heat, stress, when she is overworked. She has occasional episodes where she feels very hot and dizzy like she will fall. She denies any olfactory/gustatory hallucinations, deja vu, rising epigastric sensation, focal numbness/tingling/weakness, myoclonic jerks. She denies any staring/unresponsive episodes or gaps in time. She has tried Levetiracetam, Lamotrigine, carbamazepine, oxcarbazepine, phenytoin. She was last prescribed Levetiracetam during her visit with Dr. Jaynee Eagles in 05/2019, however she would start and stop it during her pregnancies, she is not taking seizure medication. She states it was "the 4th or 5th medication, the worst I've been on." She does not think it works, she feels spaced out and her depth perception would be different.   She has migraines 4-5 times a month, mostly on the right side with occasional nausea. She does not take any prn medications. She denies any diplopia, dysarthria/dysphagia, neck/back pain, bowel/bladder dysfunction. She feels her memory started to fade after seizures, she could not remember things from 2 days ago. She has 2 children, a 71 and a half year old and her 2  month old baby. She is breastfeeding and does not want to start any seizure medications at this time. She has no future pregnancy plans, she has had tubal ligation.  Prior AEDs: Levetiracetam, Lamotrigine, oxcarbazepine, Phenytoin, carbamazepine, Zonisamide  Diagnostic Data: EEGs: Routine EEG in 01/2016 showed diffuse beta activity, otherwise normal.  72-hour EEG in 12/2017  was abnormal due to right > left frontal epileptiform discharges, typical events not captured.   MRI: MRI brain with and without contrast 10/2019 normal, few small foci of T2 and FLAIR signal in the frontal white matter, a common finding.   PAST MEDICAL HISTORY: Past Medical History:  Diagnosis Date   Epilepsy ([place])    Epilepsy ([place]) 04/2013   GBS (group B Streptococcus carrier), +RV culture, currently pregnant 07/01/2019   Seizures ([place])     MEDICATIONS: Current Outpatient Medications on File Prior to Visit  Medication Sig Dispense Refill   LORazepam (ATIVAN) 0.5 MG tablet Take 1 tablet as needed for seizure. Do not take more than 3 a week 10 tablet 5   rizatriptan (MAXALT) 10 MG tablet Take 1 tablet at onset of migraine. May repeat in 2 hours if needed. Do not take more than 3 a week 10 tablet 6   topiramate (TOPAMAX) 100 MG tablet Take 1 tablet (100 mg total) by mouth 2 (two) times daily. '100mg'$ : Take 1 tablet twice a day 180 tablet 1   No current facility-administered medications on file prior to visit.    ALLERGIES: No Known Allergies  FAMILY HISTORY: Family History  Problem Relation Age of Onset   Hypertension Paternal Grandmother    Diabetes Paternal Grandmother    Breast cancer Paternal Grandmother    Seizures Paternal Grandmother    Hypertension Paternal Grandfather     SOCIAL HISTORY: Social History   Socioeconomic History   Marital status: Single    Spouse name: Not on file   Number of children: 4   Years of education: Not on file   Highest education level: High school graduate  Occupational History   Not on file  Tobacco Use   Smoking status: Former    Types: Cigarettes   Smokeless tobacco: Never  Vaping Use   Vaping Use: Never used  Substance and Sexual Activity   Alcohol use: No   Drug use: No    Comment: h/o smoking marijuana    Sexual activity: Yes  Other Topics Concern   Not on file  Social History Narrative   Lives with boyfriend and  children in a one story home   Right handed   Caffeine: none   Social Determinants of Health   Financial Resource Strain: Not on file  Food Insecurity: No Food Insecurity (06/29/2019)   Hunger Vital Sign    Worried About Running Out of Food in the Last Year: Never true    Ran Out of Food in the Last Year: Never true  Recent Concern: Food Insecurity - Food Insecurity Present (05/19/2019)   Hunger Vital Sign    Worried About Running Out of Food in the Last Year: Often true    Ran Out of Food in the Last Year: Often true  Transportation Needs: No Transportation Needs (06/29/2019)   PRAPARE - Hydrologist (Medical): No    Lack of Transportation (Non-Medical): No  Physical Activity: Not on file  Stress: Not on file  Social Connections: Not on file  Intimate Partner Violence: Not At Risk (05/19/2019)   Humiliation, Afraid, Rape, and Kick  questionnaire    Fear of Current or Ex-Partner: No    Emotionally Abused: No    Physically Abused: No    Sexually Abused: No     PHYSICAL EXAM: Vitals:   05/14/22 1117  BP: 125/77  Pulse: 73  SpO2: 100%   General: No acute distress Head:  Normocephalic/atraumatic, tongue bite on both sides Skin/Extremities: No rash, no edema Neurological Exam: alert and awake. No aphasia or dysarthria. Fund of knowledge is appropriate.  Attention and concentration are normal.   Cranial nerves: Pupils equal, round. Extraocular movements intact with no nystagmus. Visual fields full.  No facial asymmetry.  Motor: Bulk and tone normal, muscle strength 5/5 throughout with no pronator drift.   Finger to nose testing intact.  Gait narrow-based and steady, no ataxia. No tremors.   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 Prior prolonged EEG reported right > left frontal epileptiform discharges. MRI brain no acute changes. She continues to have 1-2 seizures  a month, most nocturnal, however seizure yesterday was in wakefulness. She is reporting side effects on Topiramate. Discussed starting Cenobamate 12.'5mg'$  qhs for 2 weeks, then '25mg'$  qhs x 2 weeks, then '50mg'$  qhs. Side effects discussed. Check CBC, CMP. Continue Topiramate '100mg'$  BID for now until her follow-up in 6 weeks. She continues to report frequent migraine, prn Maxalt sent. She is aware of [place] driving laws to stop driving after a seizure until 6 months seizure-free. Follow-up in 6 weeks, call for any changes.    Thank you for allowing me to participate in her care.  Please do not hesitate to call for any questions or concerns.    Ellouise Newer, M.D.

## 2022-05-14 NOTE — Patient Instructions (Addendum)
Good to see you.  Have bloodwork done for CBC, CMP  2. Start Xcopri 12.'5mg'$  1 tablet every night for 2 weeks, then '25mg'$  1 tablet every night for 2 weeks. Once done, your pharmacy has the '50mg'$ : take 1 tablet every night  3. Continue Topiramate '100mg'$ : take 1 tablet twice a day  4. You can contact Medical Records at [phone]... for your records.  5. Follow-up in 6 weeks, call for any changes   Seizure Precautions: 1. If medication has been prescribed for you to prevent seizures, take it exactly as directed.  Do not stop taking the medicine without talking to your doctor first, even if you have not had a seizure in a long time.   2. Avoid activities in which a seizure would cause danger to yourself or to others.  Don't operate dangerous machinery, swim alone, or climb in high or dangerous places, such as on ladders, roofs, or girders.  Do not drive unless your doctor says you may.  3. If you have any warning that you may have a seizure, lay down in a safe place where you can't hurt yourself.    4.  No driving for 6 months from last seizure, as per [facility].   Please refer to the following link on the [employer] website for more information: http://www.epilepsyfoundation.org/answerplace/Social/driving/drivingu.cfm   5.  Maintain good sleep hygiene. Avoid alcohol.  6.  Notify your neurology if you are planning pregnancy or if you become pregnant.  7.  Contact your doctor if you have any problems that may be related to the medicine you are taking.  8.  Call 911 and bring the patient back to the ED if:        A.  The seizure lasts longer than 5 minutes.       B.  The patient doesn't awaken shortly after the seizure  C.  The patient has new problems such as difficulty seeing, speaking or moving  D.  The patient was injured during the seizure  E.  The patient has a temperature over 102 F (39C)  F.  The patient vomited and now is having trouble  breathing

## 2022-05-29 ENCOUNTER — Telehealth: Payer: Self-pay

## 2022-05-29 NOTE — Telephone Encounter (Signed)
Pt called no answer left a voice mail to call back. Will also send a my chart message

## 2022-06-12 ENCOUNTER — Encounter: Payer: Self-pay | Admitting: Neurology

## 2022-06-13 ENCOUNTER — Other Ambulatory Visit: Payer: Self-pay

## 2022-06-13 DIAGNOSIS — G40009 Localization-related (focal) (partial) idiopathic epilepsy and epileptic syndromes with seizures of localized onset, not intractable, without status epilepticus: Secondary | ICD-10-CM

## 2022-06-21 ENCOUNTER — Ambulatory Visit (INDEPENDENT_AMBULATORY_CARE_PROVIDER_SITE_OTHER): Payer: Medicaid Other | Admitting: Neurology

## 2022-06-21 DIAGNOSIS — G40009 Localization-related (focal) (partial) idiopathic epilepsy and epileptic syndromes with seizures of localized onset, not intractable, without status epilepticus: Secondary | ICD-10-CM | POA: Diagnosis not present

## 2022-06-21 NOTE — Progress Notes (Signed)
EEG complete - results pending 

## 2022-06-26 ENCOUNTER — Encounter: Payer: Medicaid Other | Attending: Psychology | Admitting: Psychology

## 2022-06-27 NOTE — Procedures (Signed)
ELECTROENCEPHALOGRAM REPORT  Date of Study: 06/21/2022  Patient's Name: Natalie Lawrence MRN: [id number] Date of Birth: September 11, 1990  Referring Provider: Dr. Patrcia Dolly  Clinical History: This is a 31 year old woman with recurrent seizures. EEG for classification.  Medications: Topamax, Xcopri, maxalt  Technical Summary: A multichannel digital EEG recording measured by the international 10-20 system with electrodes applied with paste and impedances below 5000 ohms performed in our laboratory with EKG monitoring in an awake and drowsy patient.  Hyperventilation and photic stimulation were performed.  The digital EEG was referentially recorded, reformatted, and digitally filtered in a variety of bipolar and referential montages for optimal display.    Description: The patient is awake and drowsy during the recording. There is no clear posterior dominant rhythm. The record is symmetric.  During drowsiness, there is an increase in theta slowing of the background.  Sleep was not captured. Hyperventilation and photic stimulation did not elicit any abnormalities. During photic stimulation, patient started having a headache. During hyperventilation, she reporting tingling down whole body, hot and cold flashes on her face, slight dizziness, and headache in the right eye/right side of face. EEG did not show any epileptiform correlate. There were no epileptiform discharges or electrographic seizures seen.    EKG lead was unremarkable.  Impression: This awake and drowsy EEG is normal.  Episode of tingling, hot/cold flashes, headache did not show EEG correlate.  Clinical Correlation: A normal EEG does not exclude a clinical diagnosis of epilepsy.  If further clinical questions remain, prolonged EEG may be helpful.  Clinical correlation is advised.   Patrcia Dolly, M.D.

## 2022-06-29 ENCOUNTER — Ambulatory Visit (INDEPENDENT_AMBULATORY_CARE_PROVIDER_SITE_OTHER): Payer: Medicaid Other | Admitting: Neurology

## 2022-06-29 ENCOUNTER — Encounter: Payer: Self-pay | Admitting: Neurology

## 2022-06-29 VITALS — BP 114/72 | HR 71 | Ht 66.0 in | Wt 213.6 lb

## 2022-06-29 DIAGNOSIS — G40009 Localization-related (focal) (partial) idiopathic epilepsy and epileptic syndromes with seizures of localized onset, not intractable, without status epilepticus: Secondary | ICD-10-CM

## 2022-06-29 DIAGNOSIS — G43009 Migraine without aura, not intractable, without status migrainosus: Secondary | ICD-10-CM

## 2022-06-29 MED ORDER — XCOPRI 50 MG PO TABS: ORAL_TABLET | ORAL | 5 refills | Status: DC

## 2022-06-29 NOTE — Progress Notes (Signed)
NEUROLOGY FOLLOW UP OFFICE NOTE  Natalie Lawrence [id number] 1990/03/07  HISTORY OF PRESENT ILLNESS: I had the pleasure of seeing Natalie Lawrence in follow-up in the neurology clinic on 06/29/2022.  The patient was last seen 6 weeks ago for temporal lobe epilepsy and migraines. On her last visit, she continued to report 1-2 seizures a month and side effects on Topiramate. Low dose Cenobamate was added, she notes a weird feeling "almost like a spacing feeling," different from her "seizure-like feelings," but would like to continue with medication. She I son 50mg  qhs and feels a kind of dizzy feeling, like her body is light and she has to hold on to the wall. She reports a seizure in wakefulness on 4/2 that lasted a little over a minute. On 4/8 she had very strong seizure-like feelings for a good portion of the day even after taking prn lorazepam. She notes these feelings occurring 2-3 times a week. She notes seeing white flashes that occur more when she has headaches. They still occur even without headaches, she would be sitting and see the flashes especially when hot. She also notes her depression has been "really, really bad," she reports scratching herself, no suicidal ideation. She wants to go back to her prior therapist.   History on Initial Assessment 08/28/2019: The patient was seen as a virtual video visit on 08/28/2019. She has seen several neurologists, records have been reviewed and will be summarized as follows. She is a 32 year old right-handed woman with a history of seizures diagnosed at age 68. She states majority of her seizures are nocturnal. The first seizure occurred in 2015. She is noted to have convulsions with head version to the left. She would feel hot and cold flashes followed by dizziness prior to the convulsions. She has had only 2-3 seizures during wakefulness, last occurred a year ago. Sometimes she gets a warning up to 1 hour prior to the convulsions. She is confused and  amnestic of events, sleepy after, with headaches and jaw pain. No focal weakness. She had been seeing Dr. Sherryll Burger at [facility] from 2018-2019, then Dr. Lucia Gaskins last 05/2019 on the third trimester of her pregnancy. On her visit with Dr. Lucia Gaskins, she was noted to have seizures in February, March, she reports a seizure at the end of May/beginning of June, then her last seizure was 08/18/19. Triggers include heat, stress, when she is overworked. She has occasional episodes where she feels very hot and dizzy like she will fall. She denies any olfactory/gustatory hallucinations, deja vu, rising epigastric sensation, focal numbness/tingling/weakness, myoclonic jerks. She denies any staring/unresponsive episodes or gaps in time. She has tried Levetiracetam, Lamotrigine, carbamazepine, oxcarbazepine, phenytoin. She was last prescribed Levetiracetam during her visit with Dr. Lucia Gaskins in 05/2019, however she would start and stop it during her pregnancies, she is not taking seizure medication. She states it was "the 4th or 5th medication, the worst I've been on." She does not think it works, she feels spaced out and her depth perception would be different.   She has migraines 4-5 times a month, mostly on the right side with occasional nausea. She does not take any prn medications. She denies any diplopia, dysarthria/dysphagia, neck/back pain, bowel/bladder dysfunction. She feels her memory started to fade after seizures, she could not remember things from 2 days ago. She has 2 children, a 46 and a half year old and her 58 month old baby. She is breastfeeding and does not want to start any seizure medications  at this time. She has no future pregnancy plans, she has had tubal ligation.  Prior AEDs: Levetiracetam, Lamotrigine, oxcarbazepine, Phenytoin, carbamazepine, Zonisamide  Diagnostic Data: EEGs: Routine EEG in 01/2016 showed diffuse beta activity, otherwise normal.  72-hour EEG in 12/2017 was abnormal due to right > left  frontal epileptiform discharges, typical events not captured.   MRI: MRI brain with and without contrast 10/2019 normal, few small foci of T2 and FLAIR signal in the frontal white matter, a common finding.   PAST MEDICAL HISTORY: Past Medical History:  Diagnosis Date   Epilepsy (HCC)    Epilepsy (HCC) 04/2013   GBS (group B Streptococcus carrier), +RV culture, currently pregnant 07/01/2019   Seizures (HCC)     MEDICATIONS: Current Outpatient Medications on File Prior to Visit  Medication Sig Dispense Refill   Cenobamate (XCOPRI) 50 MG TABS After finishing Xcopri 12.5mg  and 25mg  samples, start Xcopri 50mg : 1 tablet every night 30 tablet 5   LORazepam (ATIVAN) 0.5 MG tablet Take 1 tablet as needed for seizure. Do not take more than 3 a week 10 tablet 5   rizatriptan (MAXALT) 10 MG tablet Take 1 tablet at onset of migraine. May repeat in 2 hours if needed. Do not take more than 3 a week 10 tablet 6   topiramate (TOPAMAX) 100 MG tablet Take 1 tablet (100 mg total) by mouth 2 (two) times daily. 100mg : Take 1 tablet twice a day 180 tablet 1   No current facility-administered medications on file prior to visit.    ALLERGIES: No Known Allergies  FAMILY HISTORY: Family History  Problem Relation Age of Onset   Hypertension Paternal Grandmother    Diabetes Paternal Grandmother    Breast cancer Paternal Grandmother    Seizures Paternal Grandmother    Hypertension Paternal Grandfather     SOCIAL HISTORY: Social History   Socioeconomic History   Marital status: Single    Spouse name: Not on file   Number of children: 4   Years of education: Not on file   Highest education level: High school graduate  Occupational History   Not on file  Tobacco Use   Smoking status: Former    Types: Cigarettes   Smokeless tobacco: Never  Vaping Use   Vaping Use: Never used  Substance and Sexual Activity   Alcohol use: No   Drug use: No    Comment: h/o smoking marijuana    Sexual activity:  Yes  Other Topics Concern   Not on file  Social History Narrative   Lives with boyfriend and children in a one story home   Right handed   Caffeine: none   Social Determinants of Health   Financial Resource Strain: Not on file  Food Insecurity: No Food Insecurity (06/29/2019)   Hunger Vital Sign    Worried About Running Out of Food in the Last Year: Never true    Ran Out of Food in the Last Year: Never true  Recent Concern: Food Insecurity - Food Insecurity Present (05/19/2019)   Hunger Vital Sign    Worried About Running Out of Food in the Last Year: Often true    Ran Out of Food in the Last Year: Often true  Transportation Needs: No Transportation Needs (06/29/2019)   PRAPARE - Administrator, Civil Service (Medical): No    Lack of Transportation (Non-Medical): No  Physical Activity: Not on file  Stress: Not on file  Social Connections: Not on file  Intimate Partner Violence: Not At  Risk (05/19/2019)   Humiliation, Afraid, Rape, and Kick questionnaire    Fear of Current or Ex-Partner: No    Emotionally Abused: No    Physically Abused: No    Sexually Abused: No     PHYSICAL EXAM: Vitals:   06/29/22 1601  BP: 114/72  Pulse: 71  SpO2: 99%   General: No acute distress Head:  Normocephalic/atraumatic Skin/Extremities: No rash, no edema Neurological Exam: alert and awake. No aphasia or dysarthria. Fund of knowledge is appropriate.  Attention and concentration are normal.   Cranial nerves: Pupils equal, round. Extraocular movements intact.  No facial asymmetry.  Motor: moves all extremities symmetrically at least antigravity x 4. Gait narrow-based and steady, no ataxia.   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 Prior prolonged EEG reported right > left frontal epileptiform discharges. MRI brain no acute changes. There appears to be a reduction in seizures but she continues to  note "seizure-like" feeling several times a week. Schedule 72-hour EEG for characterization. Continue Cenobamate 50mg  qhs and Topiramate 100mg  BID. Proceed with follow-up with Behavioral Health. She is aware of [place] driving laws to stop driving after a seizure until 6 months seizure-free. Follow-up in 3 months, call for any changes.    Thank you for allowing me to participate in her care.  Please do not hesitate to call for any questions or concerns.    Patrcia Dolly, M.D.

## 2022-06-29 NOTE — Patient Instructions (Signed)
Good to see you.  Schedule 3-day home EEG  2. Continue Xcopri 50mg  every night, Topamax 100mg  twice a day  3. Agree with seeing a psychiatrist and therapist  4. Follow-up in 3 months, call for any changes   Seizure Precautions: 1. If medication has been prescribed for you to prevent seizures, take it exactly as directed.  Do not stop taking the medicine without talking to your doctor first, even if you have not had a seizure in a long time.   2. Avoid activities in which a seizure would cause danger to yourself or to others.  Don't operate dangerous machinery, swim alone, or climb in high or dangerous places, such as on ladders, roofs, or girders.  Do not drive unless your doctor says you may.  3. If you have any warning that you may have a seizure, lay down in a safe place where you can't hurt yourself.    4.  No driving for 6 months from last seizure, as per [facility].   Please refer to the following link on the Epile[employer] Foundation of America's website for more information: http://www.epilepsyfoundation.org/answerplace/Social/driving/drivingu.cfm   5.  Maintain good sleep hygiene. Avoid alcohol.  6.  Notify your neurology if you are planning pregnancy or if you become pregnant.  7.  Contact your doctor if you have any problems that may be related to the medicine you are taking.  8.  Call 911 and bring the patient back to the ED if:        A.  The seizure lasts longer than 5 minutes.       B.  The patient doesn't awaken shortly after the seizure  C.  The patient has new problems such as difficulty seeing, speaking or moving  D.  The patient was injured during the seizure  E.  The patient has a temperature over 102 F (39C)  F.  The patient vomited and now is having trouble breathing

## 2022-07-03 ENCOUNTER — Telehealth: Payer: Self-pay | Admitting: *Deleted

## 2022-07-13 ENCOUNTER — Telehealth: Payer: Self-pay | Admitting: *Deleted

## 2022-07-18 ENCOUNTER — Encounter: Payer: Self-pay | Admitting: Neurology

## 2022-08-22 ENCOUNTER — Telehealth: Payer: Self-pay | Admitting: Neurology

## 2022-08-22 DIAGNOSIS — G43009 Migraine without aura, not intractable, without status migrainosus: Secondary | ICD-10-CM

## 2022-08-22 DIAGNOSIS — G40009 Localization-related (focal) (partial) idiopathic epilepsy and epileptic syndromes with seizures of localized onset, not intractable, without status epilepticus: Secondary | ICD-10-CM

## 2022-08-22 NOTE — Telephone Encounter (Signed)
Pls ask seizure questions and asks what she wants to do with her meds. We can't just stop them

## 2022-08-22 NOTE — Telephone Encounter (Signed)
Pt is calling in stating that she had a seizure on Monday 08/20/2022.  She is not caring to continue to take Cenobamate (XCOPRI) 50 MG and topiramate (TOPAAMAX) 100 MG and would like to have a call back.

## 2022-08-22 NOTE — Telephone Encounter (Signed)
Pt c/o: seizure Missed medications?  No. Sleep deprived?  No. No its about the same its the same sleep pattern Alcohol intake?  No. Increased stress? No. Any change in medication color or shape? No.  any trigger no she was sleeping  Back to their usual baseline self?  Yes.  . If no, advise go to ER Current medications prescribed by Dr. Karel Jarvis:   Cenobamate (XCOPRI) 50 MG TABS Take 1 tablet every night  topiramate (TOPAMAX) 100 MG tablet Take 1 tablet (100 mg total) by mouth 2 (two) times daily. 100mg : Take 1 tablet twice a day  LORazepam (ATIVAN) 0.5 MG tablet Take 1 tablet as needed for seizure  Last seizure was over a min long she has bite marks on both's sides of her tongue   Pt is asking if she can come off the topiramate now that she is on the 50 mg of the xcopri

## 2022-08-24 ENCOUNTER — Telehealth: Payer: Self-pay | Admitting: Neurology

## 2022-08-24 NOTE — Telephone Encounter (Signed)
It is still a low dose of Xcopri. I would get her on 100mg  Xcopri before we reduce (but not fully stop yet) the Topamax. If she agrees to increase, I will send in Xcopri 100mg  tablet and she can reduce Topamax to 1/2 tablet in AM, 1 tablet in PM. Thanks

## 2022-08-24 NOTE — Telephone Encounter (Signed)
Error

## 2022-08-24 NOTE — Telephone Encounter (Signed)
Pt called questions already answered

## 2022-08-24 NOTE — Telephone Encounter (Signed)
Pt stated that she will  increase Xcopri 100mg  tablet and she will reduce Topamax to 1/2 tablet in AM, 1 tablet in PM. Pt also needs her maxalt sent in for her,

## 2022-08-24 NOTE — Telephone Encounter (Signed)
Patient has questions about her medication, she did not leave a name of the medication on her VM

## 2022-08-28 ENCOUNTER — Ambulatory Visit (INDEPENDENT_AMBULATORY_CARE_PROVIDER_SITE_OTHER): Payer: Medicaid Other | Admitting: Neurology

## 2022-08-28 DIAGNOSIS — G40009 Localization-related (focal) (partial) idiopathic epilepsy and epileptic syndromes with seizures of localized onset, not intractable, without status epilepticus: Secondary | ICD-10-CM | POA: Diagnosis not present

## 2022-08-28 DIAGNOSIS — G43009 Migraine without aura, not intractable, without status migrainosus: Secondary | ICD-10-CM

## 2022-08-28 MED ORDER — XCOPRI 100 MG PO TABS: ORAL_TABLET | ORAL | 5 refills | Status: DC

## 2022-08-28 MED ORDER — RIZATRIPTAN BENZOATE 10 MG PO TABS: ORAL_TABLET | ORAL | 6 refills | Status: DC

## 2022-08-28 MED ORDER — TOPIRAMATE 100 MG PO TABS: ORAL_TABLET | ORAL | 1 refills | Status: DC

## 2022-08-28 NOTE — Progress Notes (Signed)
Ambulatory EEG hooked up and running. Light flashing. Push button tested. Camera and event log explained. Batteries explained. Patient understood.   

## 2022-08-28 NOTE — Telephone Encounter (Signed)
Rx sent for Xcopri 100mg  every night and Maxalt. Reduce Topamax 100mg : take 1/2 tab in AM, 1 tab in PM. Thanks

## 2022-08-28 NOTE — Addendum Note (Signed)
Addended by: Van Clines on: 08/28/2022 12:36 PM   Modules accepted: Orders

## 2022-08-31 NOTE — Progress Notes (Signed)
AMB EEG discontinued. No skin breakdown at [facility] *diary returned

## 2022-09-13 ENCOUNTER — Telehealth: Payer: Self-pay | Admitting: Neurology

## 2022-09-13 NOTE — Telephone Encounter (Signed)
Pt is asking about epidiolex? She said that she had talked to you about it at her appointment

## 2022-09-13 NOTE — Telephone Encounter (Signed)
Patient is calling to follow up on a form that was suppose to go to [employer] for approval ( CBD oil)/[employer]

## 2022-09-18 NOTE — Procedures (Signed)
ELECTROENCEPHALOGRAM REPORT  Dates of Recording: 08/28/2022 10:42AM to 08/31/2022 11:04AM  Patient's Name: Natalie Lawrence MRN: [id number] Date of Birth: September 27, 1990  Referring Provider: Dr. Patrcia Dolly  Procedure: 72-hour ambulatory video EEG  History: This is a 32 year old woman with recurrent seizures, recurrent "seizure-like feelings" several times a week. EEG for classification.   Medications: Topamax, Edison Nasuti, maxalt  Technical Summary: This is a 72-hour multichannel digital video EEG recording measured by the international 10-20 system with electrodes applied with paste and impedances below 5000 ohms performed as portable with EKG monitoring.  The digital EEG was referentially recorded, reformatted, and digitally filtered in a variety of bipolar and referential montages for optimal display.    DESCRIPTION OF RECORDING: During maximal wakefulness, the background activity consisted of a symmetric 12 Hz posterior dominant rhythm which was reactive to eye opening.  There were no epileptiform discharges or focal slowing seen in wakefulness.  During the recording, the patient progresses through wakefulness, drowsiness, and Stage 2 sleep.  Again, there were no epileptiform discharges seen.  Events: There were several push button events, some appear accidental.   On 6/26 at 1112 hours, she has a headache in the front of head. Patient not on video. Electrographically, there were no EEG or EKG changes seen.  On 6/26 at 1200 hours, she has more pain on the left side of head. Patient not on video. Electrographically, there were no EEG or EKG changes seen.  On 6/27 at 0950 hours, she has headache on left side of head. Patient not on video. Electrographically, there were no EEG or EKG changes seen.  On 6/27 at 1935 hours, she has tingling in face. Patient not on video. Electrographically, there were no EEG or EKG changes seen.  On 6/27 at 2257 hours, she has tingling in face. Patient not  on video. Electrographically, there were no EEG or EKG changes seen.   There were no electrographic seizures seen.  EKG lead was unremarkable.  IMPRESSION: This 72-hour ambulatory video EEG study is normal.    CLINICAL CORRELATION: A normal EEG does not exclude a clinical diagnosis of epilepsy. Episodes of headache and facial tingling did not show EEG correlate. If further clinical questions remain, inpatient video EEG monitoring may be helpful.   Patrcia Dolly, M.D.

## 2022-09-18 NOTE — Telephone Encounter (Signed)
Pt called no answer left

## 2022-09-18 NOTE — Telephone Encounter (Signed)
Pls let her know that her prolonged EEG was normal, no seizure activity seen. I would like to discuss this and the medications on her f/u. Continue Xcopri and Topamax for now. thanks

## 2022-09-19 NOTE — Telephone Encounter (Signed)
Pt called an informed that prolonged EEG was normal, no seizure activity seen.Dr Karel Jarvis  would like to discuss this and the medications on her f/u. Continue Xcopri and Topamax for now.

## 2022-10-10 ENCOUNTER — Ambulatory Visit: Payer: Medicaid Other | Admitting: Neurology

## 2022-10-22 ENCOUNTER — Telehealth: Payer: Self-pay | Admitting: Neurology

## 2022-10-22 NOTE — Telephone Encounter (Signed)
Last note on this patient Pt called an informed that prolonged EEG was normal, no seizure activity seen.Dr Karel Jarvis  would like to discuss this and the medications on her f/u. Continue Xcopri and Topamax for now.  Heat is factor  Pt c/o: seizure Missed medications?  No. Sleep deprived?  Yes.   Alcohol intake?  No. Increased stress? Yes.   Any change in medication color or shape? No. Back to their usual baseline self?  Yes.  . If no, advise go to ER Current medications prescribed by Dr. Karel Jarvis: xcopri and topamax

## 2022-10-22 NOTE — Telephone Encounter (Signed)
Natalie Lawrence is calling in stating that she has had 2 seizures Thursday 8/15 and Friday 10/19/2022 and did need to r/s her appt that she missed on 10/10/2022.  Natalie Lawrence would like to see if Dr. Karel Jarvis could get her in sooner than next year.

## 2022-11-26 ENCOUNTER — Ambulatory Visit (INDEPENDENT_AMBULATORY_CARE_PROVIDER_SITE_OTHER): Payer: Medicaid Other | Admitting: Neurology

## 2022-11-26 ENCOUNTER — Encounter: Payer: Self-pay | Admitting: Neurology

## 2022-11-26 ENCOUNTER — Other Ambulatory Visit (INDEPENDENT_AMBULATORY_CARE_PROVIDER_SITE_OTHER): Payer: Medicaid Other

## 2022-11-26 VITALS — BP 108/75 | HR 67 | Ht 66.0 in | Wt 203.6 lb

## 2022-11-26 DIAGNOSIS — G43009 Migraine without aura, not intractable, without status migrainosus: Secondary | ICD-10-CM | POA: Diagnosis not present

## 2022-11-26 DIAGNOSIS — G40009 Localization-related (focal) (partial) idiopathic epilepsy and epileptic syndromes with seizures of localized onset, not intractable, without status epilepticus: Secondary | ICD-10-CM

## 2022-11-26 MED ORDER — EPIDIOLEX 100 MG/ML PO SOLN: ORAL | 5 refills | Status: DC

## 2022-11-26 MED ORDER — TOPIRAMATE 100 MG PO TABS: ORAL_TABLET | ORAL | 3 refills | Status: DC

## 2022-11-26 MED ORDER — RIZATRIPTAN BENZOATE 10 MG PO TABS: ORAL_TABLET | ORAL | 6 refills | Status: DC

## 2022-11-26 NOTE — Patient Instructions (Signed)
Good to see you.  Have bloodwork done for CBC, CMP. We will need to check liver function test at 1 month, 3 months, 6 months of starting Epidiolex  2. Start Epidiolex 2.70mL twice a day  3. Increase Topiramate 100mg  back to 1 tablet in AM, 1 tablet in pm  4. Wean off the Xcopri: take 1/2 tablet every night for 1 week, then stop Xcopri  5. Refills sent for Maxalt  6. Follow-up in 3 months, call for any changes   Seizure Precautions: 1. If medication has been prescribed for you to prevent seizures, take it exactly as directed.  Do not stop taking the medicine without talking to your doctor first, even if you have not had a seizure in a long time.   2. Avoid activities in which a seizure would cause danger to yourself or to others.  Don't operate dangerous machinery, swim alone, or climb in high or dangerous places, such as on ladders, roofs, or girders.  Do not drive unless your doctor says you may.  3. If you have any warning that you may have a seizure, lay down in a safe place where you can't hurt yourself.    4.  No driving for 6 months from last seizure, as per [facility].   Please refer to the following link on the Epile[employer] Foundation of America's website for more information: http://www.epilepsyfoundation.org/answerplace/Social/driving/drivingu.cfm   5.  Maintain good sleep hygiene. Avoid alcohol.  6.  Notify your neurology if you are planning pregnancy or if you become pregnant.  7.  Contact your doctor if you have any problems that may be related to the medicine you are taking.  8.  Call 911 and bring the patient back to the ED if:        A.  The seizure lasts longer than 5 minutes.       B.  The patient doesn't awaken shortly after the seizure  C.  The patient has new problems such as difficulty seeing, speaking or moving  D.  The patient was injured during the seizure  E.  The patient has a temperature over 102 F (39C)  F.  The patient vomited and now is  having trouble breathing

## 2022-11-26 NOTE — Progress Notes (Signed)
NEUROLOGY FOLLOW UP OFFICE NOTE  [name] [id number] 1990/08/05  HISTORY OF PRESENT ILLNESS: I had the pleasure of seeing Natalie Lawrence in follow-up in the neurology clinic on 11/26/2022.  The patient was last seen 5 months ago for temporal lobe epilepsy and migraines. She is alone in the office today.  Records and images were personally reviewed where available.  Since her last visit, she had a 72-hour EEG in 08/2022 which was normal. She reported headaches, facial tingling, with no EEG correlate seen. She contacted our office about a seizure on 6/19, we discussed increasing Xcopri to 100mg  at bedtime, Topiramate reduced to 50mg  in AM, 100mg  in PM. She reported 2 seizures on 8/15 and 8/16 triggered by heat and increased stress. She states she reduced Topiramate but continued on 50mg  Xcopri because it was giving her the "spacey feeling," making her feel weird. The Topiramate makes her feel a little weird too, but not as much. She reports 2 seizures this month, a nocturnal seizure on 9/9 and one last 9/17 where she felt the "seizure-like feeling" and tried splashing face with water, but it progressed and she lost consciousness, waking up to her children looking at her. This was her last fall. She notes having more seizure-like feeling recently, she gets unbalanced. She has stopped the prn lorazepam because it has not helped with the seizure-like feelings.Migraines have also become more frequent, Maxalt does help, she takes it once a week but migraines last for 3 days. She does not get good sleep, she has had 4 hours of sleep at night this past week. She has started seeing Psychiatry and Zoloft was recommended for anxiety but she wanted to discuss it today first before starting medication.    History on Initial Assessment 08/28/2019: The patient was seen as a virtual video visit on 08/28/2019. She has seen several neurologists, records have been reviewed and will be summarized as follows. She is  a 32 year old right-handed woman with a history of seizures diagnosed at age 32. She states majority of her seizures are nocturnal. The first seizure occurred in 2015. She is noted to have convulsions with head version to the left. She would feel hot and cold flashes followed by dizziness prior to the convulsions. She has had only 2-3 seizures during wakefulness, last occurred a year ago. Sometimes she gets a warning up to 1 hour prior to the convulsions. She is confused and amnestic of events, sleepy after, with headaches and jaw pain. No focal weakness. She had been seeing Dr. Sherryll Burger at [facility] from 2018-2019, then Dr. Lucia Gaskins last 05/2019 on the third trimester of her pregnancy. On her visit with Dr. Lucia Gaskins, she was noted to have seizures in February, March, she reports a seizure at the end of May/beginning of June, then her last seizure was 08/18/19. Triggers include heat, stress, when she is overworked. She has occasional episodes where she feels very hot and dizzy like she will fall. She denies any olfactory/gustatory hallucinations, deja vu, rising epigastric sensation, focal numbness/tingling/weakness, myoclonic jerks. She denies any staring/unresponsive episodes or gaps in time. She has tried Levetiracetam, Lamotrigine, carbamazepine, oxcarbazepine, phenytoin. She was last prescribed Levetiracetam during her visit with Dr. Lucia Gaskins in 05/2019, however she would start and stop it during her pregnancies, she is not taking seizure medication. She states it was "the 4th or 5th medication, the worst I've been on." She does not think it works, she feels spaced out and her depth perception would be different.  She has migraines 4-5 times a month, mostly on the right side with occasional nausea. She does not take any prn medications. She denies any diplopia, dysarthria/dysphagia, neck/back pain, bowel/bladder dysfunction. She feels her memory started to fade after seizures, she could not remember things from 2  days ago. She has 2 children, a 38 and a half year old and her 74 month old baby. She is breastfeeding and does not want to start any seizure medications at this time. She has no future pregnancy plans, she has had tubal ligation.  Prior AEDs: Levetiracetam, Lamotrigine, oxcarbazepine, Phenytoin, carbamazepine, Zonisamide, Xcopri  Diagnostic Data: EEGs: Routine EEG in 01/2016 showed diffuse beta activity, otherwise normal.  72-hour EEG in 12/2017 was abnormal due to right > left frontal epileptiform discharges, typical events not captured.   MRI: MRI brain with and without contrast 10/2019 normal, few small foci of T2 and FLAIR signal in the frontal white matter, a common finding.   PAST MEDICAL HISTORY: Past Medical History:  Diagnosis Date   Epilepsy (HCC)    Epilepsy (HCC) 04/2013   GBS (group B Streptococcus carrier), +RV culture, currently pregnant 07/01/2019   Seizures (HCC)     MEDICATIONS: Current Outpatient Medications on File Prior to Visit  Medication Sig Dispense Refill   Cenobamate (XCOPRI) 100 MG TABS Take 1 tablet every night 30 tablet 5   Cenobamate (XCOPRI) 50 MG TABS Take 1 tablet every night 30 tablet 5   LORazepam (ATIVAN) 0.5 MG tablet Take 1 tablet as needed for seizure. Do not take more than 3 a week 10 tablet 5   rizatriptan (MAXALT) 10 MG tablet Take 1 tablet at onset of migraine. May repeat in 2 hours if needed. Do not take more than 3 a week 10 tablet 6   topiramate (TOPAMAX) 100 MG tablet Take 1/2 tablet in AM, 1 tablet in PM 180 tablet 1   No current facility-administered medications on file prior to visit.    ALLERGIES: No Known Allergies  FAMILY HISTORY: Family History  Problem Relation Age of Onset   Hypertension Paternal Grandmother    Diabetes Paternal Grandmother    Breast cancer Paternal Grandmother    Seizures Paternal Grandmother    Hypertension Paternal Grandfather     SOCIAL HISTORY: Social History   Socioeconomic History    Marital status: Single    Spouse name: Not on file   Number of children: 4   Years of education: Not on file   Highest education level: High school graduate  Occupational History   Not on file  Tobacco Use   Smoking status: Former    Types: Cigarettes   Smokeless tobacco: Never  Vaping Use   Vaping status: Never Used  Substance and Sexual Activity   Alcohol use: No   Drug use: No    Comment: h/o smoking marijuana    Sexual activity: Yes  Other Topics Concern   Not on file  Social History Narrative   Lives with boyfriend and children in a one story home   Right handed   Caffeine: none   Social Determinants of Health   Financial Resource Strain: Not on file  Food Insecurity: No Food Insecurity (06/29/2019)   Hunger Vital Sign    Worried About Running Out of Food in the Last Year: Never true    Ran Out of Food in the Last Year: Never true  Recent Concern: Food Insecurity - Food Insecurity Present (05/19/2019)   Hunger Vital Sign    Worried  About Running Out of Food in the Last Year: Often true    Ran Out of Food in the Last Year: Often true  Transportation Needs: No Transportation Needs (06/29/2019)   PRAPARE - Administrator, Civil Service (Medical): No    Lack of Transportation (Non-Medical): No  Physical Activity: Not on file  Stress: Not on file  Social Connections: Not on file  Intimate Partner Violence: Not At Risk (05/19/2019)   Humiliation, Afraid, Rape, and Kick questionnaire    Fear of Current or Ex-Partner: No    Emotionally Abused: No    Physically Abused: No    Sexually Abused: No     PHYSICAL EXAM: Vitals:   11/26/22 1405  BP: 108/75  Pulse: 67  SpO2: 100%   General: No acute distress Head:  Normocephalic/atraumatic Skin/Extremities: No rash, no edema Neurological Exam: alert and awake. No aphasia or dysarthria. Fund of knowledge is appropriate. Attention and concentration are normal.   Cranial nerves: Pupils equal, round. Extraocular  movements intact with no nystagmus. Visual fields full.  No facial asymmetry.  Motor: Bulk and tone normal, muscle strength 5/5 throughout with no pronator drift.   Finger to nose testing intact.  Gait narrow-based and steady, able to tandem walk adequately.  Romberg negative.   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 Prior prolonged EEG reported right > left frontal epileptiform discharges. MRI brain no acute changes. She continues to note frequent seizure-like episodes, with one leading to a seizure with loss of consciousness on 11/20/22. She has an average of 2 seizures a month. She is having side effects from [place] and wean this off. We discussed starting low dose Epidiolex, side effects discussed. Will need to check LFTs today, then at months 1,3,6 of starting medication. She is reporting more migraines, increase Topiramate back to 100mg  BID. She has prn Maxalt for migraine rescue. She is aware of [place] driving laws to stop driving after a seizure until 6 months seizure-free. Follow-up in 3 months, call for any changes.     Thank you for allowing me to participate in her care.  Please do not hesitate to call for any questions or concerns.    Patrcia Dolly, M.D.

## 2022-11-26 NOTE — Progress Notes (Signed)
Pt stated that she has had 2 seizures one on the 9th at night and one on the 17th during the day and has been having our during the day feelings

## 2022-11-27 LAB — COMPREHENSIVE METABOLIC PANEL
ALT: 5 U/L (ref 0–35)
AST: 8 U/L (ref 0–37)
Albumin: 4 g/dL (ref 3.5–5.2)
Alkaline Phosphatase: 53 U/L (ref 39–117)
BUN: 9 mg/dL (ref 6–23)
CO2: 30 mEq/L (ref 19–32)
Calcium: 9.3 mg/dL (ref 8.4–10.5)
Chloride: 105 mEq/L (ref 96–112)
Creatinine, Ser: 0.83 mg/dL (ref 0.40–1.20)
GFR: 92.3 mL/min (ref >60.00)
Glucose, Bld: 85 mg/dL (ref 70–99)
Potassium: 4.3 mEq/L (ref 3.5–5.1)
Sodium: 140 mEq/L (ref 135–145)
Total Bilirubin: 0.3 mg/dL (ref 0.2–1.2)
Total Protein: 7 g/dL (ref 6.0–8.3)

## 2022-11-27 LAB — HEPATIC FUNCTION PANEL
ALT: 5 U/L (ref 0–35)
AST: 8 U/L (ref 0–37)
Albumin: 4 g/dL (ref 3.5–5.2)
Alkaline Phosphatase: 53 U/L (ref 39–117)
Bilirubin, Direct: 0 mg/dL (ref 0.0–0.3)
Total Bilirubin: 0.3 mg/dL (ref 0.2–1.2)
Total Protein: 7 g/dL (ref 6.0–8.3)

## 2022-11-27 LAB — CBC
HCT: 38.4 % (ref 36.0–46.0)
Hemoglobin: 12.1 g/dL (ref 12.0–15.0)
MCHC: 31.5 g/dL (ref 30.0–36.0)
MCV: 87.2 fl (ref 78.0–100.0)
Platelets: 312 10*3/uL (ref 150.0–400.0)
RBC: 4.4 Mil/uL (ref 3.87–5.11)
RDW: 14.1 % (ref 11.5–15.5)
WBC: 8.5 10*3/uL (ref 4.0–10.5)

## 2022-11-28 ENCOUNTER — Telehealth: Payer: Self-pay

## 2022-11-28 NOTE — Telephone Encounter (Signed)
-----   Message from Van Clines sent at 11/27/2022  3:19 PM EDT ----- Pls let her know bloodwork is normal, proceed with starting Epidiolex when she receives it, thanks

## 2022-11-28 NOTE — Telephone Encounter (Signed)
Pt called an informed that bloodwork is normal, proceed with starting Epidiolex when she receives it,

## 2022-11-29 ENCOUNTER — Telehealth: Payer: Self-pay | Admitting: Neurology

## 2022-11-29 NOTE — Telephone Encounter (Signed)
Pt called in stating the Epidiolex needs a prior authorization. They gave her phone number [phone] to call.

## 2022-11-30 ENCOUNTER — Telehealth: Payer: Self-pay | Admitting: Pharmacy Technician

## 2022-11-30 ENCOUNTER — Other Ambulatory Visit (HOSPITAL_COMMUNITY): Payer: Self-pay

## 2022-11-30 NOTE — Telephone Encounter (Signed)
Pharmacy Patient Advocate Encounter   Received notification from Physician's Office that prior authorization for EPIDIOLEX 100MG  is required/requested.   Insurance verification completed.   The patient is insured through  [place] COMPLETE MEDICAID  .   Per test claim: PA required; PA submitted to [place] COMPLETE MEDICAID via CoverMyMeds Key/confirmation #/EOC [facility] Status is pending

## 2022-11-30 NOTE — Telephone Encounter (Signed)
Pharmacy Patient Advocate Encounter  Received notification from  [place] COMPLETE MEDICAID  that Prior Authorization for EPIDIOLEX 100MG  has been DENIED.  Full denial letter will be uploaded to the media tab. See denial reason below.   PA #/Case ID/Reference #: [id number]  Please be advised we currently do not have a Pharmacist to review denials, therefore you will need to process appeals accordingly as needed. Thanks for your support at this time. Contact for appeals are as follows: Phone: [phone], Fax: [phone]

## 2022-12-04 NOTE — Telephone Encounter (Signed)
Pt called no answer when she calls back we need to let her know that we are working on getting her epidiolix approved,

## 2022-12-04 NOTE — Telephone Encounter (Signed)
Pt called in stating the pharmacy keeps calling her to tell her they need prior authorization for her Epidiolex. She would like for our office to contact them.

## 2022-12-05 NOTE — Telephone Encounter (Signed)
Pt called back in, I informed her we were working on getting her epidiolix approved. She wanted me to let Herbert Seta know she is willing to do whatever she needs to do to help expedite things.

## 2022-12-07 NOTE — Telephone Encounter (Signed)
Shanda Bumps from [facility] called  Her phone number is [phone] Option 9 Ext [phone]  left a voice mail to call the office back with information to start the appeal for epidiolix

## 2022-12-07 NOTE — Telephone Encounter (Signed)
Jessica from [facility] called in stating she knows there is a denial of the epidiolix PA and it is being appealed. She is offering any help if it is needed. Her phone number is [phone] Option 9 Ext [phone]

## 2022-12-10 NOTE — Telephone Encounter (Signed)
Shanda Bumps called back she needs clinicals, appeal, denial letter, faxed to her at [phone] and questions can call her at [phone] Option 9 Ext [phone]

## 2022-12-11 NOTE — Telephone Encounter (Signed)
Paperwork faxed to [employer]

## 2022-12-17 ENCOUNTER — Encounter: Payer: Self-pay | Admitting: Neurology

## 2022-12-17 NOTE — Telephone Encounter (Signed)
Natalie Lawrence called back she needs clinicals, appeal, denial letter, faxed to her at [phone] and questions can call her at [phone] Option 9 Ext [phone]

## 2022-12-18 ENCOUNTER — Telehealth: Payer: Self-pay

## 2022-12-18 NOTE — Telephone Encounter (Signed)
Jessica from [facility] call she needed Aisha's number so she call her to get her to e-sign on her appeal, pt number verified, I told Shanda Bumps I would call the Pt because she knows our number and giver her Jessica's number as well, Alfretta was called and given Jessica's information  [phone] Option 9 Ext [phone] Dante stated she would call as soon as we got off the phone,

## 2022-12-20 ENCOUNTER — Ambulatory Visit: Payer: Medicaid Other | Admitting: Family Medicine

## 2022-12-20 VITALS — BP 106/76 | HR 73 | Temp 97.6°F | Ht 66.0 in | Wt 203.0 lb

## 2022-12-20 DIAGNOSIS — E66811 Obesity, class 1: Secondary | ICD-10-CM | POA: Diagnosis not present

## 2022-12-20 DIAGNOSIS — F32A Depression, unspecified: Secondary | ICD-10-CM

## 2022-12-20 DIAGNOSIS — R569 Unspecified convulsions: Secondary | ICD-10-CM | POA: Diagnosis not present

## 2022-12-20 DIAGNOSIS — Z9851 Tubal ligation status: Secondary | ICD-10-CM | POA: Diagnosis not present

## 2022-12-20 DIAGNOSIS — F419 Anxiety disorder, unspecified: Secondary | ICD-10-CM

## 2022-12-20 DIAGNOSIS — Z124 Encounter for screening for malignant neoplasm of cervix: Secondary | ICD-10-CM

## 2022-12-20 NOTE — Patient Instructions (Signed)
Thank you for trusting [place] with your healthcare.   You should hear from a gynecologist office. Let me know if you do not.   Follow up here at your convenience or as needed.
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
## 2022-12-27 NOTE — Telephone Encounter (Signed)
Shanda Bumps called she stated that she is sending the appeal for Natalie Lawrence today she said that she has been in contact with the patient she just forgot to call the office to let us know.

## 2022-12-28 ENCOUNTER — Telehealth: Payer: Self-pay | Admitting: Neurology

## 2022-12-28 NOTE — Telephone Encounter (Signed)
Approved.  

## 2022-12-28 NOTE — Telephone Encounter (Signed)
Pt called an informed that appeal was approved.

## 2022-12-28 NOTE — Telephone Encounter (Signed)
Great news! Thanks

## 2022-12-28 NOTE — Telephone Encounter (Signed)
Jessica from [facility] called and stated appeal for epidiolex was approved

## 2023-02-19 ENCOUNTER — Encounter: Payer: Medicaid Other | Admitting: Obstetrics and Gynecology

## 2023-02-22 ENCOUNTER — Ambulatory Visit: Payer: Medicaid Other | Admitting: Neurology

## 2023-02-22 ENCOUNTER — Encounter: Payer: Self-pay | Admitting: Neurology

## 2023-03-11 ENCOUNTER — Encounter: Payer: Self-pay | Admitting: Neurology

## 2023-04-06 ENCOUNTER — Emergency Department (HOSPITAL_COMMUNITY): Payer: Medicaid Other

## 2023-04-06 ENCOUNTER — Emergency Department (HOSPITAL_COMMUNITY)
Admission: EM | Admit: 2023-04-06 | Discharge: 2023-04-06 | Disposition: A | Discharge status: Home / Self Care | Payer: Medicaid Other | Attending: Emergency Medicine | Admitting: Emergency Medicine

## 2023-04-06 DIAGNOSIS — R569 Unspecified convulsions: Secondary | ICD-10-CM | POA: Insufficient documentation

## 2023-04-06 LAB — CBC WITH DIFFERENTIAL/PLATELET
Abs Immature Granulocytes: 0.06 10*3/uL (ref 0.00–0.07)
Basophils Absolute: 0.1 10*3/uL (ref 0.0–0.1)
Basophils Relative: 0 %
Eosinophils Absolute: 0 10*3/uL (ref 0.0–0.5)
Eosinophils Relative: 0 %
HCT: 40.8 % (ref 36.0–46.0)
Hemoglobin: 12.8 g/dL (ref 12.0–15.0)
Immature Granulocytes: 1 %
Lymphocytes Relative: 22 %
Lymphs Abs: 2.7 10*3/uL (ref 0.7–4.0)
MCH: 28.3 pg (ref 26.0–34.0)
MCHC: 31.4 g/dL (ref 30.0–36.0)
MCV: 90.3 fL (ref 80.0–100.0)
Monocytes Absolute: 0.7 10*3/uL (ref 0.1–1.0)
Monocytes Relative: 6 %
Neutro Abs: 8.6 10*3/uL — ABNORMAL HIGH (ref 1.7–7.7)
Neutrophils Relative %: 71 %
Platelets: 279 10*3/uL (ref 150–400)
RBC: 4.52 MIL/uL (ref 3.87–5.11)
RDW: 12.9 % (ref 11.5–15.5)
WBC: 12.2 10*3/uL — ABNORMAL HIGH (ref 4.0–10.5)
nRBC: 0 % (ref 0.0–0.2)

## 2023-04-06 LAB — BASIC METABOLIC PANEL
Anion gap: 14 (ref 5–15)
BUN: 9 mg/dL (ref 6–20)
CO2: 14 mmol/L — ABNORMAL LOW (ref 22–32)
Calcium: 8.9 mg/dL (ref 8.9–10.3)
Chloride: 109 mmol/L (ref 98–111)
Creatinine, Ser: 0.8 mg/dL (ref 0.44–1.00)
GFR, Estimated: >60 mL/min (ref >60)
Glucose, Bld: 102 mg/dL — ABNORMAL HIGH (ref 70–99)
Potassium: 3.4 mmol/L — ABNORMAL LOW (ref 3.5–5.1)
Sodium: 137 mmol/L (ref 135–145)

## 2023-04-06 LAB — CBG MONITORING, ED: Glucose-Capillary: 113 mg/dL — ABNORMAL HIGH (ref 70–99)
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
## 2023-04-06 NOTE — Discharge Instructions (Signed)
Take your medications exactly as prescribed, ER for worsening symptoms

## 2023-04-06 NOTE — ED Triage Notes (Signed)
Pt arrives via [facility] who were called for an unresponsive person. Pt was found laying on the sidewalk/road. Initially uncooperative with EMS per report, potentially post ictal. Pt has hx of seizures, with last episode one month ago, on Topamax. Pt tearful in triage, c/o headache.

## 2023-04-08 ENCOUNTER — Encounter: Payer: Self-pay | Admitting: Neurology

## 2023-04-08 NOTE — Telephone Encounter (Signed)
 F/u scheduled for 2/7

## 2023-04-12 ENCOUNTER — Encounter: Payer: Self-pay | Admitting: Neurology

## 2023-04-12 ENCOUNTER — Ambulatory Visit (INDEPENDENT_AMBULATORY_CARE_PROVIDER_SITE_OTHER): Payer: Medicaid Other | Admitting: Neurology

## 2023-04-12 ENCOUNTER — Other Ambulatory Visit: Payer: Medicaid Other

## 2023-04-12 VITALS — BP 119/88 | HR 80 | Ht 66.0 in | Wt 217.6 lb

## 2023-04-12 DIAGNOSIS — G40009 Localization-related (focal) (partial) idiopathic epilepsy and epileptic syndromes with seizures of localized onset, not intractable, without status epilepticus: Secondary | ICD-10-CM | POA: Diagnosis not present

## 2023-04-12 DIAGNOSIS — G43009 Migraine without aura, not intractable, without status migrainosus: Secondary | ICD-10-CM | POA: Diagnosis not present

## 2023-04-12 MED ORDER — RIZATRIPTAN BENZOATE 10 MG PO TABS: ORAL_TABLET | ORAL | 6 refills | Status: DC

## 2023-04-12 MED ORDER — EPIDIOLEX 100 MG/ML PO SOLN: ORAL | 5 refills | Status: DC

## 2023-04-12 MED ORDER — TOPIRAMATE 100 MG PO TABS: ORAL_TABLET | ORAL | 3 refills | Status: DC
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
## 2023-04-12 NOTE — Telephone Encounter (Signed)
 Schedule for 04/12/23

## 2023-04-12 NOTE — Progress Notes (Signed)
 NEUROLOGY FOLLOW UP OFFICE NOTE  [name] [id number] 1990-05-30  HISTORY OF PRESENT ILLNESS: I had the pleasure of seeing Natalie Lawrence in follow-up in the neurology clinic on 04/12/2023.  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 She is alone in the office today. Records and images were personally reviewed where available.  She contacted our office with her seizure diary, she had 2 in October, 2 in November, 1 in December. She had 2 on 04/06/23, she alerted her son she was having the seizure-like feeling, then came to with her son crying. They came in the house and she was walking around in circles, they got her to sit and noted she was shaking for 1 minute then slept. When she woke up, her head was hurting. She started cooking then felt hot and told family she was going outside for air. She recalls having the same feeling and tried to sit down and get her phone, then woke up in the hospital. She hit her face and still feels numb on the right lower lip, she bit the left side of her tongue. It hurts to turn her head to the right, she has tingling on that side. She notes that for the past few months, the seizure feelings have been outrageous, occurring 4-5 times a week. On her last visit, she expressed interest in starting Epidiolex  which was approved by insurance in 12/2022, however she got scared to start it. She continues on Topiramate  100mg  BID. She reports migraines occurring daily, Maxalt  has not been helping recently.   Laboratory Data: Lab Results  Component Value Date   ALT 9 04/12/2023   AST 13 04/12/2023   ALKPHOS 53 11/26/2022   ALKPHOS 53 11/26/2022   BILITOT 0.5 04/12/2023     History on Initial Assessment 08/28/2019: The patient was seen as a virtual video visit on 08/28/2019. She has seen several neurologists, records have been reviewed and will be summarized as follows. She is a 33 year old right-handed woman with a history of  seizures diagnosed at age 58. She states majority of her seizures are nocturnal. The first seizure occurred in 2015. She is noted to have convulsions with head version to the left. She would feel hot and cold flashes followed by dizziness prior to the convulsions. She has had only 2-3 seizures during wakefulness, last occurred a year ago. Sometimes she gets a warning up to 1 hour prior to the convulsions. She is confused and amnestic of events, sleepy after, with headaches and jaw pain. No focal weakness. She had been seeing Dr. Maree at [facility] from 2018-2019, then Dr. Ines last 05/2019 on the third trimester of her pregnancy. On her visit with Dr. Ines, she was noted to have seizures in February, March, she reports a seizure at the end of May/beginning of June, then her last seizure was 08/18/19. Triggers include heat, stress, when she is overworked. She has occasional episodes where she feels very hot and dizzy like she will fall. She denies any olfactory/gustatory hallucinations, deja vu, rising epigastric sensation, focal numbness/tingling/weakness, myoclonic jerks. She denies any staring/unresponsive episodes or gaps in time. She has tried Levetiracetam , Lamotrigine, carbamazepine, oxcarbazepine, phenytoin. She was last prescribed Levetiracetam  during her visit with Dr. Ines in 05/2019, however she would start and stop it during her pregnancies, she is not taking seizure medication. She states it was the 4th or 5th medication, the worst I've been on. She does not think it  works, she feels spaced out and her depth perception would be different.   She has migraines 4-5 times a month, mostly on the right side with occasional nausea. She does not take any prn medications. She denies any diplopia, dysarthria/dysphagia, neck/back pain, bowel/bladder dysfunction. She feels her memory started to fade after seizures, she could not remember things from 2 days ago. She has 2 children, a 2 and a half year  old and her 35 month old baby. She is breastfeeding and does not want to start any seizure medications at this time. She has no future pregnancy plans, she has had tubal ligation.  Prior AEDs: Levetiracetam , Lamotrigine, oxcarbazepine, Phenytoin, carbamazepine, Zonisamide , Xcopri   Diagnostic Data: EEGs: Routine EEG in 01/2016 showed diffuse beta activity, otherwise normal.  72-hour EEG in 12/2017 was abnormal due to right > left frontal epileptiform discharges, typical events not captured.  72-hour EEG in 08/2022 which was normal. She reported headaches, facial tingling, with no EEG correlate seen  MRI: MRI brain with and without contrast 10/2019 normal, few small foci of T2 and FLAIR signal in the frontal white matter, a common finding.  PAST MEDICAL HISTORY: Past Medical History:  Diagnosis Date   Anxiety    Depression 2021   Epilepsy (HCC)    Epilepsy (HCC) 04/2013   GBS (group B Streptococcus carrier), +RV culture, currently pregnant 07/01/2019   Seizures (HCC)     MEDICATIONS: Current Outpatient Medications on File Prior to Visit  Medication Sig Dispense Refill   rizatriptan  (MAXALT ) 10 MG tablet Take 1 tablet at onset of migraine. May repeat in 2 hours if needed. Do not take more than 3 a week (Patient taking differently: Take 10 mg by mouth as needed for migraine. Take 1 tablet at onset of migraine. May repeat in 2 hours if needed. Do not take more than 3 a week) 10 tablet 6   topiramate  (TOPAMAX ) 100 MG tablet Take 1 tablet in AM, 1 tablet in PM (Patient taking differently: Take 100 mg by mouth 2 (two) times daily. Take 1 tablet in AM, 1 tablet in PM) 180 tablet 3   cannabidiol  (EPIDIOLEX ) 100 MG/ML solution Take 2.5 mg/kg by mouth 2 (two) times daily. (Patient not taking: Reported on 04/12/2023)     No current facility-administered medications on file prior to visit.    ALLERGIES: No Known Allergies  FAMILY HISTORY: Family History  Problem Relation Age of Onset   Diabetes  Father    High blood pressure Father    Kidney disease Father        2 kdney transplants   Hypertension Paternal Grandmother    Diabetes Paternal Grandmother    Breast cancer Paternal Grandmother    Seizures Paternal Grandmother    Arthritis Paternal Grandmother    Asthma Paternal Grandmother    High Cholesterol Paternal Grandmother    Hypertension Paternal Grandfather    Arthritis Paternal Grandfather    Diabetes Paternal Grandfather    High Cholesterol Paternal Grandfather     SOCIAL HISTORY: Social History   Socioeconomic History   Marital status: Single    Spouse name: Not on file   Number of children: 4   Years of education: Not on file   Highest education level: 12th grade  Occupational History   Not on file  Tobacco Use   Smoking status: Former    Types: Cigarettes   Smokeless tobacco: Never  Vaping Use   Vaping status: Never Used  Substance and Sexual Activity   Alcohol  use: No   Drug use: No    Comment: h/o smoking marijuana    Sexual activity: Yes  Other Topics Concern   Not on file  Social History Narrative   Lives with boyfriend and children in a one story home   Right handed   Caffeine: none   Social Drivers of Corporate Investment Banker Strain: Low Risk  (12/20/2022)   Overall Financial Resource Strain (CARDIA)    Difficulty of Paying Living Expenses: Not hard at all  Food Insecurity: No Food Insecurity (12/20/2022)   Hunger Vital Sign    Worried About Running Out of Food in the Last Year: Never true    Ran Out of Food in the Last Year: Never true  Transportation Needs: No Transportation Needs (12/20/2022)   PRAPARE - Administrator, Civil Service (Medical): No    Lack of Transportation (Non-Medical): No  Physical Activity: Insufficiently Active (12/20/2022)   Exercise Vital Sign    Days of Exercise per Week: 2 days    Minutes of Exercise per Session: 10 min  Stress: Stress Concern Present (12/20/2022)   [employer] of  Occupational Health - Occupational Stress Questionnaire    Feeling of Stress : Very much  Social Connections: Unknown (12/20/2022)   Social Connection and Isolation Panel [NHANES]    Frequency of Communication with Friends and Family: Twice a week    Frequency of Social Gatherings with Friends and Family: Once a week    Attends Religious Services: Patient declined    Database Administrator or Organizations: No    Attends Engineer, Structural: Not on file    Marital Status: Never married  Intimate Partner Violence: Not At Risk (05/19/2019)   Humiliation, Afraid, Rape, and Kick questionnaire    Fear of Current or Ex-Partner: No    Emotionally Abused: No    Physically Abused: No    Sexually Abused: No     PHYSICAL EXAM: Vitals:   04/12/23 1338  BP: 119/88  Pulse: 80  SpO2: 100%   General: No acute distress Head:  Normocephalic/atraumatic Skin/Extremities: No rash, no edema Neurological Exam: alert and awake. No aphasia or dysarthria. Fund of knowledge is appropriate.   Attention and concentration are normal.   Cranial nerves: Pupils equal, round. Extraocular movements intact with no nystagmus. Visual fields full.  No facial asymmetry.  Motor: Bulk and tone normal, muscle strength 5/5 throughout with no pronator drift.   Finger to nose testing intact.  Gait narrow-based and steady, able to tandem walk adequately.  Romberg negative.   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 Prior prolonged EEG reported right > left frontal epileptiform discharges. MRI brain no acute changes. She continues to note frequent seizure-like episodes, recently she had the aura followed by loss of consciousness with facial injury on 04/06/23. We discussed her concerns about Epidiolex , she is agreeable to restarting low dose 2.5mL at bedtime for 1 week, and if no issues, increase to 2.5mL BID. Continue Topiramate  100mg   BID for seizure and migraine prophylaxis, Maxalt  for rescue. Continue LFT monitoring while on Epidiolex . Continue seizure and migraine calendar, follow-up in 3 months. She is aware of [place] driving laws to stop driving after a seizure until 6 months seizure-free. She knows to call for any changes.   Thank you for allowing me to participate in her care.  Please do not hesitate to call  for any questions or concerns.    Darice Shivers, M.D.   CC: Eilleen Mackintosh, NP-C

## 2023-04-13 LAB — HEPATIC FUNCTION PANEL
AG Ratio: 1.2 (calc) (ref 1.0–2.5)
ALT: 9 U/L (ref 6–29)
AST: 13 U/L (ref 10–30)
Albumin: 4.4 g/dL (ref 3.6–5.1)
Alkaline phosphatase (APISO): 61 U/L (ref 31–125)
Bilirubin, Direct: 0.1 mg/dL (ref 0.0–0.2)
Globulin: 3.7 g/dL (ref 1.9–3.7)
Indirect Bilirubin: 0.4 mg/dL (ref 0.2–1.2)
Total Bilirubin: 0.5 mg/dL (ref 0.2–1.2)
Total Protein: 8.1 g/dL (ref 6.1–8.1)

## 2023-04-19 ENCOUNTER — Encounter: Payer: Self-pay | Admitting: Neurology

## 2023-05-02 ENCOUNTER — Encounter: Payer: Self-pay | Admitting: Neurology

## 2023-05-21 ENCOUNTER — Encounter: Payer: Self-pay | Admitting: Neurology

## 2023-06-20 ENCOUNTER — Encounter: Payer: Self-pay | Admitting: Neurology

## 2023-07-02 ENCOUNTER — Encounter: Payer: Self-pay | Admitting: Neurology

## 2023-07-06 MED ORDER — CLOBAZAM 10 MG PO TABS: ORAL_TABLET | ORAL | 5 refills | Status: DC

## 2023-07-15 ENCOUNTER — Encounter: Payer: Self-pay | Admitting: Neurology

## 2023-07-15 ENCOUNTER — Ambulatory Visit (INDEPENDENT_AMBULATORY_CARE_PROVIDER_SITE_OTHER): Payer: Medicaid Other | Admitting: Neurology

## 2023-07-15 VITALS — BP 112/78 | HR 70 | Ht 66.0 in | Wt 238.0 lb

## 2023-07-15 DIAGNOSIS — G40009 Localization-related (focal) (partial) idiopathic epilepsy and epileptic syndromes with seizures of localized onset, not intractable, without status epilepticus: Secondary | ICD-10-CM

## 2023-07-15 DIAGNOSIS — G43009 Migraine without aura, not intractable, without status migrainosus: Secondary | ICD-10-CM

## 2023-07-15 NOTE — Patient Instructions (Signed)
 Good to see you.  Proceed with starting Clobazam  when able. Continue Topiramate   2.  Please contact the [employer] [employer] [facility] [place], they have been very helpful with medication support Local: [phone] Email: [email or web address]@[email or web address] [email or web address] Helpline: [phone]) 130-8657  3. Let me know if you would like to start the process for VNS  4. Follow-up in 3 months, call for any changes   Seizure Precautions: 1. If medication has been prescribed for you to prevent seizures, take it exactly as directed.  Do not stop taking the medicine without talking to your doctor first, even if you have not had a seizure in a long time.   2. Avoid activities in which a seizure would cause danger to yourself or to others.  Don't operate dangerous machinery, swim alone, or climb in high or dangerous places, such as on ladders, roofs, or girders.  Do not drive unless your doctor says you may.  3. If you have any warning that you may have a seizure, lay down in a safe place where you can't hurt yourself.    4.  No driving for 6 months from last seizure, as per [place]  state law.   Please refer to the following link on the Epilepsy Foundation of America's website for more information: http://www.epilepsyfoundation.org/answerplace/Social/driving/drivingu.cfm   5.  Maintain good sleep hygiene. Avoid alcohol.  6.  Notify your neurology if you are planning pregnancy or if you become pregnant.  7.  Contact your doctor if you have any problems that may be related to the medicine you are taking.  8.  Call 911 and bring the patient back to the ED if:        A.  The seizure lasts longer than 5 minutes.       B.  The patient doesn't awaken shortly after the seizure  C.  The patient has new problems such as difficulty seeing, speaking or moving  D.  The patient was injured during the seizure  E.  The patient has a temperature over 102 F (39C)  F.  The patient vomited and now is having trouble breathing

## 2023-07-15 NOTE — Progress Notes (Signed)
 NEUROLOGY FOLLOW UP OFFICE NOTE  MYLEENA FARLEIGH [id number] 1990/09/17  HISTORY OF PRESENT ILLNESS: I had the pleasure of seeing Glodine Elick in follow-up in the neurology clinic on 07/15/2023.  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 She is alone in the office today.  Records and images were personally reviewed where available.  She contacted our office about a seizure on 3/17 while in the car, then again after she got out and fell on the stairs. She reported a different type of seizure on 4/17 where she was told it was like she was speaking a different language after it happened. She had 3 seizures on 4/28, one was nocturnal, she bit both sides of her tongue and reported a headache. She reports the Epidiolex  caused side effects. Clobazam  was added to Topiramate  100mg  BID, however she reports she has been unable to afford the $4 co-pay and has not started it yet. No seizures since 4/28 but she woke up dizzy and has been intermittently feeling it. She is not sure if it is the weather, she has been taking it slow. She is feeling okay right now. She continues to report migraines, taking Maxalt  three times a week on average. It eases off the edge but does not do too much. No falls.    History on Initial Assessment 08/28/2019: The patient was seen as a virtual video visit on 08/28/2019. She has seen several neurologists, records have been reviewed and will be summarized as follows. She is a 33 year old right-handed woman with a history of seizures diagnosed at age 33. She states majority of her seizures are nocturnal. The first seizure occurred in 2015. She is noted to have convulsions with head version to the left. She would feel hot and cold flashes followed by dizziness prior to the convulsions. She has had only 2-3 seizures during wakefulness, last occurred a year ago. Sometimes she gets a warning up to 1 hour prior to the convulsions. She is confused and  amnestic of events, sleepy after, with headaches and jaw pain. No focal weakness. She had been seeing Dr. Mason Sole at [facility] from 2018-2019, then Dr. Tresia Fruit last 05/2019 on the third trimester of her pregnancy. On her visit with Dr. Tresia Fruit, she was noted to have seizures in February, March, she reports a seizure at the end of May/beginning of June, then her last seizure was 08/18/19. Triggers include heat, stress, when she is overworked. She has occasional episodes where she feels very hot and dizzy like she will fall. She denies any olfactory/gustatory hallucinations, deja vu, rising epigastric sensation, focal numbness/tingling/weakness, myoclonic jerks. She denies any staring/unresponsive episodes or gaps in time. She has tried Levetiracetam , Lamotrigine, carbamazepine, oxcarbazepine, phenytoin. She was last prescribed Levetiracetam  during her visit with Dr. Tresia Fruit in 05/2019, however she would start and stop it during her pregnancies, she is not taking seizure medication. She states it was "the 4th or 5th medication, the worst I've been on." She does not think it works, she feels spaced out and her depth perception would be different.   She has migraines 4-5 times a month, mostly on the right side with occasional nausea. She does not take any prn medications. She denies any diplopia, dysarthria/dysphagia, neck/back pain, bowel/bladder dysfunction. She feels her memory started to fade after seizures, she could not remember things from 2 days ago. She has 2 children, a 62 and a half year old and her 51 month old baby.  She is breastfeeding and does not want to start any seizure medications at this time. She has no future pregnancy plans, she has had tubal ligation.  Prior AEDs: Levetiracetam , Lamotrigine, oxcarbazepine, Phenytoin, carbamazepine, Zonisamide , Xcopri , Epidiolex   Diagnostic Data: EEGs: Routine EEG in 01/2016 showed diffuse beta activity, otherwise normal.  72-hour EEG in 12/2017 was abnormal  due to right > left frontal epileptiform discharges, typical events not captured.  72-hour EEG in 08/2022 which was normal. She reported headaches, facial tingling, with no EEG correlate seen  MRI: MRI brain with and without contrast 10/2019 normal, few small foci of T2 and FLAIR signal in the frontal white matter, a common finding.  PAST MEDICAL HISTORY: Past Medical History:  Diagnosis Date   Anxiety    Depression 2021   Epilepsy (HCC)    Epilepsy (HCC) 04/2013   GBS (group B Streptococcus carrier), +RV culture, currently pregnant 07/01/2019   Seizures (HCC)     MEDICATIONS: Current Outpatient Medications on File Prior to Visit  Medication Sig Dispense Refill   rizatriptan  (MAXALT ) 10 MG tablet Take 1 tablet at onset of migraine. May repeat in 2 hours if needed. Do not take more than 3 a week 10 tablet 6   topiramate  (TOPAMAX ) 100 MG tablet Take 1 tablet in AM, 1 tablet in PM 180 tablet 3   cloBAZam  (ONFI ) 10 MG tablet Take 1/2 tablet every night (Patient not taking: Reported on 07/15/2023) 30 tablet 5   No current facility-administered medications on file prior to visit.    ALLERGIES: No Known Allergies  FAMILY HISTORY: Family History  Problem Relation Age of Onset   Diabetes Father    High blood pressure Father    Kidney disease Father        2 kdney transplants   Hypertension Paternal Grandmother    Diabetes Paternal Grandmother    Breast cancer Paternal Grandmother    Seizures Paternal Grandmother    Arthritis Paternal Grandmother    Asthma Paternal Grandmother    High Cholesterol Paternal Grandmother    Hypertension Paternal Grandfather    Arthritis Paternal Grandfather    Diabetes Paternal Grandfather    High Cholesterol Paternal Grandfather     SOCIAL HISTORY: Social History   Socioeconomic History   Marital status: Single    Spouse name: Not on file   Number of children: 4   Years of education: Not on file   Highest education level: 12th grade   Occupational History   Not on file  Tobacco Use   Smoking status: Former    Types: Cigarettes   Smokeless tobacco: Never  Vaping Use   Vaping status: Never Used  Substance and Sexual Activity   Alcohol use: No   Drug use: No    Comment: h/o smoking marijuana    Sexual activity: Yes  Other Topics Concern   Not on file  Social History Narrative   Lives with boyfriend and children in a one story home   Right handed   Caffeine: none   Social Drivers of Corporate investment banker Strain: Low Risk  (12/20/2022)   Overall Financial Resource Strain (CARDIA)    Difficulty of Paying Living Expenses: Not hard at all  Food Insecurity: No Food Insecurity (12/20/2022)   Hunger Vital Sign    Worried About Running Out of Food in the Last Year: Never true    Ran Out of Food in the Last Year: Never true  Transportation Needs: No Transportation Needs (12/20/2022)   PRAPARE -  Administrator, Civil Service (Medical): No    Lack of Transportation (Non-Medical): No  Physical Activity: Insufficiently Active (12/20/2022)   Exercise Vital Sign    Days of Exercise per Week: 2 days    Minutes of Exercise per Session: 10 min  Stress: Stress Concern Present (12/20/2022)   [employer] of Occupational Health - Occupational Stress Questionnaire    Feeling of Stress : Very much  Social Connections: Unknown (12/20/2022)   Social Connection and Isolation Panel [NHANES]    Frequency of Communication with Friends and Family: Twice a week    Frequency of Social Gatherings with Friends and Family: Once a week    Attends Religious Services: Patient declined    Database administrator or Organizations: No    Attends Engineer, structural: Not on file    Marital Status: Never married  Intimate Partner Violence: Not At Risk (05/19/2019)   Humiliation, Afraid, Rape, and Kick questionnaire    Fear of Current or Ex-Partner: No    Emotionally Abused: No    Physically Abused: No     Sexually Abused: No     PHYSICAL EXAM: Vitals:   07/15/23 1342  BP: 112/78  Pulse: 70  SpO2: 100%   General: No acute distress Head:  Normocephalic/atraumatic Skin/Extremities: No rash, no edema Neurological Exam: alert and awake. No aphasia or dysarthria. Fund of knowledge is appropriate. Attention and concentration are normal.   Cranial nerves: Pupils equal, round. Extraocular movements intact with no nystagmus. Visual fields full.  No facial asymmetry.  Motor: Bulk and tone normal, muscle strength 5/5 throughout with no pronator drift.   Finger to nose testing intact.  Gait narrow-based and steady, able to tandem walk adequately.  Romberg negative.   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 Prior prolonged EEG reported right > left frontal epileptiform discharges. MRI brain no acute changes. She has had side effects to multiple medications and continues to have frequent seizures, last was on 4/28 when she had 3 in one day. She was prescribed Clobazam  but has not started it yet, start 10mg  1/2 tab at bedtime, hopefull she can tolerate it as she has been sensitive to medications in the past. We also discussed other options such as VNS placement, we discussed benefits of VNS, she would like to read more about it. Continue Topiramate  100mg  BID, prn Maxalt . She does not drive. She was given information for the [facility] to help with medication support. Follow-up in 3 months, call for any changes.   Thank you for allowing me to participate in her care.  Please do not hesitate to call for any questions or concerns.    Rayfield Cairo, M.D.   CC: Beula Brunswick, NP-C

## 2023-07-16 ENCOUNTER — Other Ambulatory Visit: Payer: Self-pay | Admitting: Neurology

## 2023-07-16 DIAGNOSIS — G40009 Localization-related (focal) (partial) idiopathic epilepsy and epileptic syndromes with seizures of localized onset, not intractable, without status epilepticus: Secondary | ICD-10-CM

## 2023-07-16 DIAGNOSIS — G43009 Migraine without aura, not intractable, without status migrainosus: Secondary | ICD-10-CM

## 2023-07-16 MED ORDER — CLOBAZAM 10 MG PO TABS: ORAL_TABLET | ORAL | 5 refills | Status: DC

## 2023-07-16 MED ORDER — TOPIRAMATE 100 MG PO TABS: ORAL_TABLET | ORAL | 3 refills | Status: DC

## 2023-07-17 ENCOUNTER — Telehealth: Payer: Self-pay

## 2023-07-17 NOTE — Telephone Encounter (Signed)
 From Dr. Ty Gales- Can you pls let patient know Natalie Lawrence with the [facility] contacted me and I have sent the prescriptions to [facility] so she can get them. Thanks!  Called pt at [phone] and informed her of Dr. Merita Staples message. She understood.

## 2023-08-12 ENCOUNTER — Encounter: Payer: Self-pay | Admitting: Neurology

## 2023-08-20 ENCOUNTER — Emergency Department (HOSPITAL_COMMUNITY)
Admission: EM | Admit: 2023-08-20 | Discharge: 2023-08-21 | Disposition: A | Discharge status: Other Acute Inpatient Hospital | Source: Home / Self Care | Attending: Emergency Medicine | Admitting: Emergency Medicine

## 2023-08-20 ENCOUNTER — Encounter: Payer: Self-pay | Admitting: Neurology

## 2023-08-20 ENCOUNTER — Other Ambulatory Visit: Payer: Self-pay

## 2023-08-20 ENCOUNTER — Encounter (HOSPITAL_COMMUNITY): Payer: Self-pay

## 2023-08-20 DIAGNOSIS — D72829 Elevated white blood cell count, unspecified: Secondary | ICD-10-CM | POA: Insufficient documentation

## 2023-08-20 DIAGNOSIS — F32A Depression, unspecified: Secondary | ICD-10-CM | POA: Insufficient documentation

## 2023-08-20 DIAGNOSIS — R45851 Suicidal ideations: Secondary | ICD-10-CM | POA: Insufficient documentation

## 2023-08-20 LAB — CBC WITH DIFFERENTIAL/PLATELET
Abs Immature Granulocytes: 0.05 10*3/uL (ref 0.00–0.07)
Basophils Absolute: 0.1 10*3/uL (ref 0.0–0.1)
Basophils Relative: 0 %
Eosinophils Absolute: 0 10*3/uL (ref 0.0–0.5)
Eosinophils Relative: 0 %
HCT: 42.1 % (ref 36.0–46.0)
Hemoglobin: 12.8 g/dL (ref 12.0–15.0)
Immature Granulocytes: 0 %
Lymphocytes Relative: 16 %
Lymphs Abs: 1.8 10*3/uL (ref 0.7–4.0)
MCH: 27.2 pg (ref 26.0–34.0)
MCHC: 30.4 g/dL (ref 30.0–36.0)
MCV: 89.6 fL (ref 80.0–100.0)
Monocytes Absolute: 0.7 10*3/uL (ref 0.1–1.0)
Monocytes Relative: 7 %
Neutro Abs: 8.5 10*3/uL — ABNORMAL HIGH (ref 1.7–7.7)
Neutrophils Relative %: 77 %
Platelets: 281 10*3/uL (ref 150–400)
RBC: 4.7 MIL/uL (ref 3.87–5.11)
RDW: 13.1 % (ref 11.5–15.5)
WBC: 11.2 10*3/uL — ABNORMAL HIGH (ref 4.0–10.5)
nRBC: 0 % (ref 0.0–0.2)

## 2023-08-20 LAB — RAPID URINE DRUG SCREEN, HOSP PERFORMED
Amphetamines: NOT DETECTED
Barbiturates: NOT DETECTED
Benzodiazepines: NOT DETECTED
Cocaine: NOT DETECTED
Opiates: NOT DETECTED
Tetrahydrocannabinol: POSITIVE — AB

## 2023-08-20 LAB — COMPREHENSIVE METABOLIC PANEL WITH GFR
ALT: 12 U/L (ref 0–44)
AST: 16 U/L (ref 15–41)
Albumin: 3.9 g/dL (ref 3.5–5.0)
Alkaline Phosphatase: 57 U/L (ref 38–126)
Anion gap: 9 (ref 5–15)
BUN: 10 mg/dL (ref 6–20)
CO2: 20 mmol/L — ABNORMAL LOW (ref 22–32)
Calcium: 9.1 mg/dL (ref 8.9–10.3)
Chloride: 110 mmol/L (ref 98–111)
Creatinine, Ser: 0.94 mg/dL (ref 0.44–1.00)
GFR, Estimated: >60 mL/min (ref >60)
Glucose, Bld: 112 mg/dL — ABNORMAL HIGH (ref 70–99)
Potassium: 3.6 mmol/L (ref 3.5–5.1)
Sodium: 139 mmol/L (ref 135–145)
Total Bilirubin: 0.4 mg/dL (ref 0.0–1.2)
Total Protein: 7.7 g/dL (ref 6.5–8.1)

## 2023-08-20 LAB — POC URINE PREG, ED: Preg Test, Ur: NEGATIVE

## 2023-08-20 LAB — ACETAMINOPHEN LEVEL: Acetaminophen (Tylenol), Serum: <10 ug/mL — ABNORMAL LOW (ref 10–30)

## 2023-08-20 LAB — ETHANOL: Alcohol, Ethyl (B): <15 mg/dL (ref <15)

## 2023-08-20 LAB — SALICYLATE LEVEL: Salicylate Lvl: <7 mg/dL — ABNORMAL LOW (ref 7.0–30.0)

## 2023-08-20 MED ORDER — CLOBAZAM 5 MG PO HALF TABLET
5.0000 mg | ORAL_TABLET | Freq: Every day | ORAL | Status: DC
  Administered 2023-08-20: 5 mg via ORAL

## 2023-08-20 MED ORDER — TOPIRAMATE 25 MG PO TABS
100.0000 mg | ORAL_TABLET | Freq: Two times a day (BID) | ORAL | Status: DC
  Administered 2023-08-20 – 2023-08-21 (×2): 100 mg via ORAL
  Filled 2023-08-20 (×2): qty 4
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
## 2023-08-20 NOTE — ED Notes (Signed)
 Pt reports she was banging her head on the wall and attempted to get a knife to kill herself in the kitchen, but was stopped.  Pt tearful, and wishes to be able to take her kids to live with family in [place] .
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
## 2023-08-20 NOTE — ED Notes (Signed)
 Pt dressed out into burgundy scrubs; pts belongings (clothes and phone) in bag in locker 7; pts suitcase also in locker room

## 2023-08-21 ENCOUNTER — Inpatient Hospital Stay (HOSPITAL_COMMUNITY)
Admission: AD | Admit: 2023-08-21 | Discharge: 2023-08-24 | DRG: 881 | Disposition: A | Discharge status: Home / Self Care | Source: Intra-hospital | Attending: Psychiatry | Admitting: Psychiatry

## 2023-08-21 DIAGNOSIS — Z9152 Personal history of nonsuicidal self-harm: Secondary | ICD-10-CM | POA: Diagnosis not present

## 2023-08-21 DIAGNOSIS — Z9151 Personal history of suicidal behavior: Secondary | ICD-10-CM | POA: Diagnosis not present

## 2023-08-21 DIAGNOSIS — Z87891 Personal history of nicotine dependence: Secondary | ICD-10-CM | POA: Diagnosis not present

## 2023-08-21 DIAGNOSIS — G40909 Epilepsy, unspecified, not intractable, without status epilepticus: Secondary | ICD-10-CM | POA: Diagnosis present

## 2023-08-21 DIAGNOSIS — R45851 Suicidal ideations: Secondary | ICD-10-CM | POA: Diagnosis present

## 2023-08-21 DIAGNOSIS — F32A Depression, unspecified: Principal | ICD-10-CM | POA: Diagnosis present

## 2023-08-21 DIAGNOSIS — Z62811 Personal history of psychological abuse in childhood: Secondary | ICD-10-CM | POA: Diagnosis not present

## 2023-08-21 DIAGNOSIS — F419 Anxiety disorder, unspecified: Secondary | ICD-10-CM | POA: Diagnosis present

## 2023-08-21 DIAGNOSIS — E663 Overweight: Secondary | ICD-10-CM | POA: Diagnosis present

## 2023-08-21 DIAGNOSIS — Z560 Unemployment, unspecified: Secondary | ICD-10-CM | POA: Diagnosis not present

## 2023-08-21 DIAGNOSIS — Z6838 Body mass index (BMI) 38.0-38.9, adult: Secondary | ICD-10-CM

## 2023-08-21 DIAGNOSIS — Z79899 Other long term (current) drug therapy: Secondary | ICD-10-CM

## 2023-08-21 DIAGNOSIS — F329 Major depressive disorder, single episode, unspecified: Principal | ICD-10-CM | POA: Diagnosis present

## 2023-08-21 MED ORDER — LORAZEPAM 2 MG/ML IJ SOLN: 2.0000 mg | Freq: Three times a day (TID) | INTRAMUSCULAR | Status: DC | PRN

## 2023-08-21 MED ORDER — HALOPERIDOL LACTATE 5 MG/ML IJ SOLN: 10.0000 mg | Freq: Three times a day (TID) | INTRAMUSCULAR | Status: DC | PRN

## 2023-08-21 MED ORDER — ALUM & MAG HYDROXIDE-SIMETH 200-200-20 MG/5ML PO SUSP
30.0000 mL | ORAL | Status: DC | PRN
Start: 2023-08-21 — End: 2023-08-24

## 2023-08-21 MED ORDER — DIPHENHYDRAMINE HCL 50 MG/ML IJ SOLN: 50.0000 mg | Freq: Three times a day (TID) | INTRAMUSCULAR | Status: DC | PRN

## 2023-08-21 MED ORDER — ACETAMINOPHEN 325 MG PO TABS
650.0000 mg | ORAL_TABLET | Freq: Four times a day (QID) | ORAL | Status: DC | PRN
  Administered 2023-08-23 – 2023-08-24 (×3): 650 mg via ORAL
  Filled 2023-08-21 (×3): qty 2

## 2023-08-21 MED ORDER — TOPIRAMATE 100 MG PO TABS: 100.0000 mg | ORAL_TABLET | Freq: Two times a day (BID) | ORAL | Status: DC

## 2023-08-21 MED ORDER — HALOPERIDOL 5 MG PO TABS: 5.0000 mg | ORAL_TABLET | Freq: Three times a day (TID) | ORAL | Status: DC | PRN

## 2023-08-21 MED ORDER — TOPIRAMATE 100 MG PO TABS
100.0000 mg | ORAL_TABLET | Freq: Two times a day (BID) | ORAL | Status: DC
  Administered 2023-08-22 – 2023-08-24 (×5): 100 mg via ORAL
  Filled 2023-08-21 (×6): qty 1

## 2023-08-21 MED ORDER — CLOBAZAM 5 MG PO HALF TABLET: 5.0000 mg | ORAL_TABLET | Freq: Every day | ORAL | Status: DC

## 2023-08-21 MED ORDER — MAGNESIUM HYDROXIDE 400 MG/5ML PO SUSP: 30.0000 mL | Freq: Every day | ORAL | Status: DC | PRN

## 2023-08-21 MED ORDER — DIPHENHYDRAMINE HCL 25 MG PO CAPS: 50.0000 mg | ORAL_CAPSULE | Freq: Three times a day (TID) | ORAL | Status: DC | PRN

## 2023-08-21 MED ORDER — HALOPERIDOL LACTATE 5 MG/ML IJ SOLN: 5.0000 mg | Freq: Three times a day (TID) | INTRAMUSCULAR | Status: DC | PRN

## 2023-08-21 NOTE — Progress Notes (Signed)
 Pt has been accepted to [facility] on 08/21/2023 Bed assignment: 303-2  Pt meets inpatient criteria per: Bonnita Buttner NP  Attending Physician will be: Dr. Zouev MD   Report can be called to:Adult unit: [phone]  Pt can arrive after (pending items are received/pending discharges)  Care Team Notified: [facility] Kathryn Parish RN, Lady Pier NP  [place] Uilani Sanville LCSW-A   08/21/2023 10:58 AM
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
## 2023-08-21 NOTE — ED Notes (Signed)
 Pt on the phone with [employer].

## 2023-08-21 NOTE — ED Notes (Signed)
 Pt signed Voluntary consent for [facility] and the immediately asked if she could refuse to go to inpatient treatment. Nurse asked pt if she understood that is what we were waiting on. Pt did not understand procedure and does not want to leave her kids for several days.

## 2023-08-21 NOTE — ED Notes (Signed)
Pt ambulated to bathroom independently without any issues.

## 2023-08-21 NOTE — ED Notes (Signed)
 Awaiting pt to be served IVC papers prior to calling report to facility. Pt calm and cooperative at this time.

## 2023-08-21 NOTE — ED Notes (Signed)
 LEO at bedside serving IVC papers.

## 2023-08-21 NOTE — ED Notes (Signed)
 EDP aware and will start IVC papers.

## 2023-08-21 NOTE — ED Provider Notes (Signed)
 Emergency Medicine Observation Re-evaluation Note  Natalie Lawrence is a 33 y.o. female, seen on rounds today.  Pt initially presented to the ED for complaints of Medical Clearance and Suicidal Currently, the patient is not having new complaints.  Physical Exam  BP 99/71   Pulse 81   Temp 98.1 F (36.7 C)   Resp 16   Ht 5' 6 (1.676 m)   Wt 108 kg   SpO2 98%   BMI 38.43 kg/m  Physical Exam General: Resting comfortably in stretcher Lungs: Normal work of breathing Psych: Calm  ED Course / MDM  EKG:   I have reviewed the labs performed to date as well as medications administered while in observation.  Recent changes in the last 24 hours include seen by TTS who recommends inpatient treatment.  Plan  Current plan is for placement.    Ninetta Basket, MD 08/21/23 [phone]

## 2023-08-21 NOTE — ED Notes (Addendum)
 Significant other called and requesting update. Nurse updated Randel Buss that he is not on the chart and he is not able to get information or visit today. Cory Badgett gave nurse information stating pt just started a new medication for seizures and he feels it caused drowsiness, mood changes.

## 2023-08-21 NOTE — Group Note (Unsigned)
 Date:  08/21/2023 Time:  8:35 PM  Group Topic/Focus:  Wrap-Up Group:   The focus of this group is to help patients review their daily goal of treatment and discuss progress on daily workbooks.     Participation Level:  {BHH PARTICIPATION MWUXL:24401}  Participation Quality:  {BHH PARTICIPATION QUALITY:22265}  Affect:  {BHH AFFECT:22266}  Cognitive:  {BHH COGNITIVE:22267}  Insight: {BHH Insight2:20797}  Engagement in Group:  {BHH ENGAGEMENT IN UUVOZ:36644}  Modes of Intervention:  {BHH MODES OF INTERVENTION:22269}  Additional Comments:  ***  Natalie Lawrence 08/21/2023, 8:35 PM

## 2023-08-21 NOTE — ED Notes (Signed)
 Pt belongings from locker 7 and pt suitcase, along with transport paperwork given to sheriff transporting pt to [facility].

## 2023-08-21 NOTE — ED Notes (Signed)
 TTS in progress

## 2023-08-22 ENCOUNTER — Telehealth: Payer: Self-pay

## 2023-08-22 ENCOUNTER — Encounter (HOSPITAL_COMMUNITY): Payer: Self-pay | Admitting: Psychiatry

## 2023-08-22 ENCOUNTER — Other Ambulatory Visit: Payer: Self-pay

## 2023-08-22 MED ORDER — CLOBAZAM 5 MG PO HALF TABLET: 5.0000 mg | ORAL_TABLET | Freq: Every day | ORAL | Status: DC
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
## 2023-08-22 NOTE — Progress Notes (Addendum)
 Natalie Lawrence, significant other, phone [phone], he is father of patient's children.  This is crazy and borderline illegal.  He is her voice.  Patient stated her problem is the medicine that she is prpermax (?).  The new addition is covamax (?).  Patient stated this is the medicine that causing this problem.  I am about to lose it, this is not the right way to protect her.  She has been talking medicine.  [place] has failed her two times.  Took her phone and stuff.  This medicine is doing it to her.  She already has therapy outside.  She has told MD that she has these problems when she started taking these medicines.   I need her out of there.  This is ridiculous.  She needs to be home with her kids.  This is ridiculous.  I have talked to her MD.  She is epileptic.  That new medicine caused her to be this way.  She has never been this way before.  She needs to be up out of there.  This is [facility].  I just want my lady out of there.  She don't need to be there.

## 2023-08-22 NOTE — Progress Notes (Signed)
 D:  Patient's self inventory sheet, patient has poor sleep, no sleep medication.  Poor appetite, normal energy level, poor concentration.  Depression 8 and anxiety 8, because I am not with my kids.  Denied withdrawals.  Denied SI.  Denied physical problems.  Denied physical pain.  Goal is get back to kids.  Plans to talk to MD.  Why am I here when I had the chose not to. A:  Medicine given this morning that was ordered for this patient.  Emotional support and encouragement given patient. R:  Denied SI and HI, contracts for safety.  Denied A/V hallucinations.  Safety maintained with 15 minute checks.

## 2023-08-22 NOTE — BHH Suicide Risk Assessment (Signed)
 [facility] Admission Suicide Risk Assessment   Nursing information obtained from:  Patient Demographic factors:  Low socioeconomic status Current Mental Status:  Self-harm behaviors, Self-harm thoughts, Suicidal ideation indicated by patient Loss Factors:  Financial problems / change in socioeconomic status Historical Factors:  Prior suicide attempts Risk Reduction Factors:  Responsible for children under 35 years of age, Positive therapeutic relationship  Total Time spent with patient: 45 minutes Principal Problem: MDD (major depressive disorder) Diagnosis:  Principal Problem:   MDD (major depressive disorder)  Subjective Data: see H&P  Continued Clinical Symptoms:  Alcohol Use Disorder Identification Test Final Score (AUDIT): 0 The Alcohol Use Disorders Identification Test, Guidelines for Use in Primary Care, Second Edition.  World Science writer [facility]). Score between 0-7:  no or low risk or alcohol related problems. Score between 8-15:  moderate risk of alcohol related problems. Score between 16-19:  high risk of alcohol related problems. Score 20 or above:  warrants further diagnostic evaluation for alcohol dependence and treatment.   CLINICAL FACTORS:   Medical Diagnoses and Treatments/Surgeries   Musculoskeletal: Strength & Muscle Tone: within normal limits Gait & Station: normal Patient leans: N/A  Psychiatric Specialty Exam:  Presentation  General Appearance:  Appropriate for Environment  Eye Contact: Fleeting  Speech: Slow  Speech Volume: Decreased  Handedness:No data recorded  Mood and Affect  Mood: Depressed; Anxious  Affect: Congruent   Thought Process  Thought Processes: Coherent  Descriptions of Associations:Intact  Orientation:Full (Time, Place and Person)  Thought Content:Logical  History of Schizophrenia/Schizoaffective disorder:No  Duration of Psychotic Symptoms:No data recorded Hallucinations:Hallucinations: None  Ideas of  Reference:None  Suicidal Thoughts:Suicidal Thoughts: No  Homicidal Thoughts:Homicidal Thoughts: No   Sensorium  Memory: Immediate Fair  Judgment: Fair  Insight: Fair   Art therapist  Concentration: Fair  Attention Span: Fair  Recall: [employer] of Knowledge: Fair  Language: Fair   Psychomotor Activity  Psychomotor Activity: Psychomotor Activity: Normal   Assets  Assets: Communication Skills; Desire for Improvement; Financial Resources/Insurance; Social Support; Physical Health; Intimacy; Housing   Sleep  Sleep: Sleep: Fair    Physical Exam: Physical Exam ROS Blood pressure 120/89, pulse 80, temperature 98.8 F (37.1 C), temperature source Oral, resp. rate 16, height 5' 6 (1.676 m), weight 102.5 kg, SpO2 100%, unknown if currently breastfeeding. Body mass index is 36.48 kg/m.   COGNITIVE FEATURES THAT CONTRIBUTE TO RISK:  None    SUICIDE RISK:   Moderate:  Frequent suicidal ideation with limited intensity, and duration, some specificity in terms of plans, no associated intent, good self-control, limited dysphoria/symptomatology, some risk factors present, and identifiable protective factors, including available and accessible social support.  PLAN OF CARE: admit to inpatient psychiatry  I certify that inpatient services furnished can reasonably be expected to improve the patient's condition.   Belal Scallon, MD 08/22/2023, 9:28 PM

## 2023-08-22 NOTE — Telephone Encounter (Signed)
 Pt called from [facility] she was involuntary commented notes are in EPIC  she stated that Take the Onfi  caused the suicidal thoughts. She went to the hospital she said that they told her she had a choice of going or not and then she was served papers. Pt is asking if we can have it revered I told her can not reverse court ordered papers but I would let Dr Ty Gales know what is going on

## 2023-08-22 NOTE — Tx Team (Signed)
 Initial Treatment Plan 08/22/2023 1:45 AM Natalie Lawrence [id number]    PATIENT STRESSORS: Financial difficulties   Health problems   Marital or family conflict     PATIENT STRENGTHS: Forensic psychologist fund of knowledge  Supportive family/friends    PATIENT IDENTIFIED PROBLEMS: Medication management                     DISCHARGE CRITERIA:  Ability to meet basic life and health needs Medical problems require only outpatient monitoring  PRELIMINARY DISCHARGE PLAN: Return to previous living arrangement  PATIENT/FAMILY INVOLVEMENT: This treatment plan has been presented to and reviewed with the patient, Natalie Lawrence, and/or family member.  The patient and family have been given the opportunity to ask questions and make suggestions.  Geri Ko, RN 08/22/2023, 1:45 AM

## 2023-08-22 NOTE — Progress Notes (Signed)
 Per pharmacy, Onfi  not available at scheduled time. Topamax  scheduled, patient refused stating that she does not need it for the night. Writer informed that she would not have seizure medications until morning, and she would miss dose, thereby increasing risk for seizure activity. Patient acknowledged information, agreed to discuss med concerns with provider in am.   08/22/23 0131  Psych Admission Type (Psych Patients Only)  Admission Status Involuntary  Psychosocial Assessment  Patient Complaints Agitation;Anger;Anxiety;Crying spells;Depression;Confusion;Worrying  Eye Contact Fair  Facial Expression Anxious  Affect Anxious;Depressed  Speech Logical/coherent  Interaction Assertive  Motor Activity Other (Comment) (WDL)  Appearance/Hygiene Unremarkable  Behavior Characteristics Appropriate to situation;Anxious  Mood Depressed;Anxious  Thought Process  Coherency WDL  Content Blaming others;Blaming self  Delusions None reported or observed  Perception WDL  Hallucination None reported or observed  Judgment WDL  Confusion None  Danger to Self  Current suicidal ideation? Denies  Agreement Not to Harm Self Yes  Description of Agreement verbal  Danger to Others  Danger to Others None reported or observed

## 2023-08-22 NOTE — BHH Group Notes (Signed)
 Adult Psychoeducational Group Note  Date:  08/22/2023 Time:  11:21 AM  Group Topic/Focus:  Goals Group:   The focus of this group is to help patients establish daily goals to achieve during treatment and discuss how the patient can incorporate goal setting into their daily lives to aide in recovery.  Participation Level:  Minimal  Participation Quality:  Appropriate and Attentive  Affect:  Appropriate  Cognitive:  Appropriate  Insight: Appropriate  Engagement in Group:  Engaged  Modes of Intervention:  Exploration  Additional Comments:  Pt participated in Goals group. Pt stated her goal is to speak with Provider and get a better understanding of the process.   Kysa Calais 08/22/2023, 11:21 AM

## 2023-08-22 NOTE — Telephone Encounter (Signed)
 See other phone note

## 2023-08-22 NOTE — Progress Notes (Signed)
 Nurse called [name] phone [phone].  A man answered after approximately 6 rings and stated this is not his phone number.   Security guard at [facility] informed in case this person does come to [facility].   MD informed also. Charge nurse informed.

## 2023-08-22 NOTE — Telephone Encounter (Signed)
 Agree to above, communicated to her [facility] team as well

## 2023-08-22 NOTE — Telephone Encounter (Signed)
 Pt boy friend called with concerns about pt still taken Onfi , Spoke with Dr Ty Gales she stated that it is ok for the pt to stop taken the Clobazam . Dr Ty Gales is going to reach out to [facility] to let them know, Pt boyfriend is not on the DPR we can not call him back ,

## 2023-08-22 NOTE — BHH Group Notes (Addendum)
 BHH Group Notes:  (Nursing/MHT/Case Management/Adjunct)  Date:  08/22/2023  Time:  2000  Type of Therapy:  Wrap up group  Participation Level:  Active  Participation Quality:  Appropriate, Attentive, Sharing, and Supportive  Affect:  Depressed  Cognitive:  Alert  Insight:  Improving  Engagement in Group:  Engaged  Modes of Intervention:  Clarification, Education, and Support  Summary of Progress/Problems: Positive thinking and positive change were discussed.  Catharine Clock 08/22/2023, 8:59 PM
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
## 2023-08-22 NOTE — Group Note (Signed)
 LCSW Group Therapy Note   Group Date: 08/22/2023 Start Time: 1100 End Time: 1200   Participation:  patient was present and actively participated in the discussion.  She was insightful and shared personal experiences.  Type of Therapy:  Group Therapy   Topic:  Healing From Within: Understanding Our Past, Building Our Future"  Objective:  To help participants understand the impact of early experiences on mental and physical health, with a focus on Adverse Childhood Experiences (ACEs), and to explore ways to build resilience and healing.  Group Goals: Understand ACEs and Their Impact: Learn how childhood experiences shape mental and physical health. Build Resilience: Develop strategies for overcoming challenges and creating positive change. Promote Healing: Recognize the value of support and the possibility of healing through therapy and self-care.  Summary: In today's session, we discussed how early experiences, especially ACEs, impact mental and physical health. We explored the effects of stress, abuse, and neglect on brain development and well-being. The group focused on resilience, understanding that healing and positive change are possible with support and self-awareness.  Therapeutic Modalities Used: Psychoeducation: Sharing information about ACEs and their effects. Cognitive Behavioral Therapy (CBT): Helping reframe negative thought patterns. Trauma-Informed Therapy: Creating a safe, supportive space for healing.   Renaldo Gornick O Jearlean Demauro, LCSWA 08/22/2023  12:41 PM
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
## 2023-08-22 NOTE — Plan of Care (Signed)
 Nurse discussed anxiety, depression and coping skills with patient.

## 2023-08-23 ENCOUNTER — Encounter (HOSPITAL_COMMUNITY): Payer: Self-pay

## 2023-08-23 ENCOUNTER — Telehealth: Payer: Self-pay | Admitting: Neurology

## 2023-08-23 NOTE — Group Note (Signed)
 Date:  08/23/2023 Time:  11:24 AM  Group Topic/Focus:  Goals Group:   The focus of this group is to help patients establish daily goals to achieve during treatment and discuss how the patient can incorporate goal setting into their daily lives to aide in recovery.    Participation Level:  Active  Participation Quality:  Appropriate and Attentive  Affect:  Appropriate  Cognitive:  Alert and Appropriate  Insight: Appropriate and Good  Engagement in Group:  Engaged  Modes of Intervention:  Discussion  Additional Comments:  Pt state her goal is to stay positive and attend group.  Tita Form 08/23/2023, 11:24 AM

## 2023-08-23 NOTE — Progress Notes (Signed)
   08/23/23 0800  Psych Admission Type (Psych Patients Only)  Admission Status Involuntary  Psychosocial Assessment  Patient Complaints Anxiety  Eye Contact Fair  Facial Expression Anxious  Affect Anxious  Speech Logical/coherent  Interaction Assertive  Motor Activity Other (Comment) (WDL)  Appearance/Hygiene Unremarkable  Behavior Characteristics Appropriate to situation  Mood Depressed;Anxious  Thought Process  Coherency WDL  Content WDL  Delusions None reported or observed  Perception WDL  Judgment WDL  Confusion None  Danger to Self  Current suicidal ideation? Denies  Agreement Not to Harm Self Yes  Description of Agreement Verbal

## 2023-08-23 NOTE — Telephone Encounter (Signed)
 Pt.s boyfgriend called not on any DPR so no info was provided, wanted to try and find out info on Pt.'s [facility] hold, explained I will make Dr. Jackqulyn Masse but no updates would be provided as he is not on any paper work

## 2023-08-23 NOTE — Progress Notes (Signed)
Pt attended AA group 

## 2023-08-23 NOTE — Progress Notes (Addendum)
 [facility] MD Progress Note  08/23/2023 9:00 PM Natalie Lawrence  MRN:  [id number] Subjective:   Patient appears euthymic. Denies SI, HI or AVH. Denies wanting to be  dead. Denies feeling hopeless. Denies feelings of guilt or that she is a burden to others. Patient repeatedly denies any abuse or fear of her significant other. States they are in couples counseling. Patient does state she was prescribed Zoloft but does not wish to take it and denies symptoms consistent with PTSD or OCD. Patient notes good sleep and appetite and reported sleep by staff is 7 hours. Patient has been attending group therapy and interactive with peers. Patient states it reminds me of my individual therapy. Patient states her presentation was a reaction to her seizure medication and she is feeling better. Denies anhedonia and states she is looking forward to seeing her kids. Patient denies worrying about everything or worrying from a young age. Denies muscle tension. Denies difficulty concentrating. Given patient's prior mood swings and suicidal thoughts (she reports last occurring >8 years ago) I suggested she try Zoloft but patient declines and declines any psychotropic medications.    Principal Problem: Depression Diagnosis: Principal Problem:   Depression  Total Time spent with patient: 30 minutes  Past Psychiatric History: see H&P  Past Medical History:  Past Medical History:  Diagnosis Date   Anxiety    Depression 2021   Epilepsy (HCC)    Epilepsy (HCC) 04/2013   GBS (group B Streptococcus carrier), +RV culture, currently pregnant 07/01/2019   Seizures (HCC)     Past Surgical History:  Procedure Laterality Date   CESAREAN SECTION  2012   TUBAL LIGATION Bilateral 07/03/2019   Procedure: POST PARTUM TUBAL LIGATION;  Surgeon: Lorence Ozell CROME, MD;  Location: [facility] LD ORS;  Service: Gynecology;  Laterality: Bilateral;   Family History:  Family History  Problem Relation Age of Onset   Diabetes Father    High  blood pressure Father    Kidney disease Father        2 kdney transplants   Hypertension Paternal Grandmother    Diabetes Paternal Grandmother    Breast cancer Paternal Grandmother    Seizures Paternal Grandmother    Arthritis Paternal Grandmother    Asthma Paternal Grandmother    High Cholesterol Paternal Grandmother    Hypertension Paternal Grandfather    Arthritis Paternal Grandfather    Diabetes Paternal Grandfather    High Cholesterol Paternal Grandfather    Family Psychiatric  History: se H&P Social History:  Social History   Substance and Sexual Activity  Alcohol Use No     Social History   Substance and Sexual Activity  Drug Use No   Comment: h/o smoking marijuana     Social History   Socioeconomic History   Marital status: Single    Spouse name: Not on file   Number of children: 4   Years of education: Not on file   Highest education level: 12th grade  Occupational History   Not on file  Tobacco Use   Smoking status: Former    Types: Cigarettes    Passive exposure: Past   Smokeless tobacco: Never  Vaping Use   Vaping status: Never Used  Substance and Sexual Activity   Alcohol use: No   Drug use: No    Comment: h/o smoking marijuana    Sexual activity: Yes  Other Topics Concern   Not on file  Social History Narrative   Lives with boyfriend and children in a  one story home   Right handed   Caffeine: none   Social Drivers of Corporate investment banker Strain: Low Risk  (12/20/2022)   Overall Financial Resource Strain (CARDIA)    Difficulty of Paying Living Expenses: Not hard at all  Food Insecurity: No Food Insecurity (08/22/2023)   Hunger Vital Sign    Worried About Running Out of Food in the Last Year: Never true    Ran Out of Food in the Last Year: Never true  Transportation Needs: No Transportation Needs (08/22/2023)   PRAPARE - Administrator, Civil Service (Medical): No    Lack of Transportation (Non-Medical): No  Physical  Activity: Insufficiently Active (12/20/2022)   Exercise Vital Sign    Days of Exercise per Week: 2 days    Minutes of Exercise per Session: 10 min  Stress: Stress Concern Present (12/20/2022)   [employer] of Occupational Health - Occupational Stress Questionnaire    Feeling of Stress : Very much  Social Connections: Unknown (12/20/2022)   Social Connection and Isolation Panel    Frequency of Communication with Friends and Family: Twice a week    Frequency of Social Gatherings with Friends and Family: Once a week    Attends Religious Services: Patient declined    Database administrator or Organizations: No    Attends Engineer, structural: Not on file    Marital Status: Never married   Additional Social History:                         Sleep: Fair Estimated Sleeping Duration (Last 24 Hours): 5.25-6.50 hours  Appetite:  Fair  Current Medications: Current Facility-Administered Medications  Medication Dose Route Frequency Provider Last Rate Last Admin   acetaminophen  (TYLENOL ) tablet 650 mg  650 mg Oral Q6H PRN Weber, Kyra A, NP   650 mg at 08/23/23 1350   alum & mag hydroxide-simeth (MAALOX/MYLANTA) 200-200-20 MG/5ML suspension 30 mL  30 mL Oral Q4H PRN Weber, Kyra A, NP       haloperidol (HALDOL) tablet 5 mg  5 mg Oral TID PRN Weber, Kyra A, NP       And   diphenhydrAMINE  (BENADRYL ) capsule 50 mg  50 mg Oral TID PRN Weber, Kyra A, NP       haloperidol lactate (HALDOL) injection 5 mg  5 mg Intramuscular TID PRN Weber, Kyra A, NP       And   diphenhydrAMINE  (BENADRYL ) injection 50 mg  50 mg Intramuscular TID PRN Weber, Kyra A, NP       And   LORazepam  (ATIVAN ) injection 2 mg  2 mg Intramuscular TID PRN Weber, Kyra A, NP       haloperidol lactate (HALDOL) injection 10 mg  10 mg Intramuscular TID PRN Weber, Kyra A, NP       And   diphenhydrAMINE  (BENADRYL ) injection 50 mg  50 mg Intramuscular TID PRN Weber, Kyra A, NP       And   LORazepam  (ATIVAN )  injection 2 mg  2 mg Intramuscular TID PRN Weber, Kyra A, NP       magnesium  hydroxide (MILK OF MAGNESIA) suspension 30 mL  30 mL Oral Daily PRN Weber, Kyra A, NP       topiramate  (TOPAMAX ) tablet 100 mg  100 mg Oral BID Natalie Vanderhoof, MD   100 mg at 08/23/23 1811    Lab Results: No results found for this or any previous  visit (from the past 48 hours).  Blood Alcohol level:  Lab Results  Component Value Date   [facility] <15 08/20/2023   ETH <10 05/16/2018    Metabolic Disorder Labs: Lab Results  Component Value Date   HGBA1C 5.0 05/19/2019   No results found for: PROLACTIN No results found for: CHOL, TRIG, HDL, CHOLHDL, VLDL, LDLCALC  Physical Findings: AIMS:  ,  ,  ,  ,  ,  ,   CIWA:    COWS:     Musculoskeletal: Strength & Muscle Tone: within normal limits Gait & Station: normal Patient leans: N/A  Psychiatric Specialty Exam:  Presentation  General Appearance:  Appropriate for Environment  Eye Contact: Good  Speech: Clear and Coherent  Speech Volume: Normal  Handedness:No data recorded  Mood and Affect  Mood: Euthymic  Affect: Appropriate   Thought Process  Thought Processes: Coherent  Descriptions of Associations:Intact  Orientation:Full (Time, Place and Person)  Thought Content:Logical  History of Schizophrenia/Schizoaffective disorder:No  Duration of Psychotic Symptoms:No data recorded Hallucinations:Hallucinations: None  Ideas of Reference:None  Suicidal Thoughts:Suicidal Thoughts: No  Homicidal Thoughts:Homicidal Thoughts: No   Sensorium  Memory: Immediate Fair  Judgment: Fair  Insight: Fair   Art therapist  Concentration: Fair  Attention Span: Fair  Recall: [employer] of Knowledge: Fair  Language: Fair   Psychomotor Activity  Psychomotor Activity: Psychomotor Activity: Normal   Assets  Assets: Communication Skills; Desire for Improvement; Intimacy; Social Support; Nature conservation officer   Sleep  Sleep: Sleep: Fair    Physical Exam:  General: Well developed, overweight [place] female Pupils: Normal at 3mm Respiratory: Breathing is unlabored.  Cardiovascular: No edema.  Language: No anomia, no aphasia Muscle strength and tone-pt moving all extremities.  Gait not assessed as pt remained in bed.  Neuro: Facial muscles are symmetric. Pt without tremor, no evidence of hyperarousal.  Review of Systems  Constitutional: Negative.   HENT: Negative.    Eyes: Negative.   Respiratory: Negative.    Cardiovascular: Negative.   Gastrointestinal: Negative.   Genitourinary: Negative.   Musculoskeletal: Negative.   Skin: Negative.   Neurological: Negative.   Endo/Heme/Allergies: Negative.   Psychiatric/Behavioral:  negative for depression and negative for suicidal ideas. The patient denies anxiety     Blood pressure 113/83, pulse 74, temperature 98.8 F (37.1 C), temperature source Oral, resp. rate 18, height 5' 6 (1.676 m), weight 102.5 kg, SpO2 100%, unknown if currently breastfeeding. Body mass index is 36.48 kg/m.   Treatment Plan Summary: 33 year old female with history of depression, anxiety (as well as self reports anxiety, OCD, PTSD) presented as a voluntary walk-in to [facility] due to SI with plan to use a knife. Patient reports history of depression and Epilepsy. Patient denied HI, psychosis and alcohol/drug usage.    Patient presented to ED after  SI with a plan although states she never had a knife in her hand. Reports she was going to get one but her significant other stopped her. States she was suicidal but states this was a reaction to her antiepileptic. Patient euthymic at time of evaluation and denies SI, wanting to be dead and states she wants to see her kids.    Initially reported prior suicide attempts but notes states they were suicidal gestures and she never actually overdosed or jumped of a building and never intended to  follow through. Patient does admit to history of cutting. Patient presents with cluster B traits and possible borderline personality disorder however adamantly denies  symptoms of BPD. Given multiple inconsistencies between what patient reported in ED yesterday and what she tells me now (primarily escalating pattern of suicidal ideation the last 2 weeks as well multiple prior suicide attempts and stating presentation leading up to hospitalization was a suicide attempt), will hold patient for period of observation of 72 hours to make sure she is stable.   Patient was prescribed Zoloft on outpatient basis but tells me she is not depressed or anxious at this time and does not want to take any medication for depression or anxiety. Patient is attending groups and interacting with peers today.  Previous note indicated SI for  2 weeks  however patient states this is incorrect and it was only 1-2 days prior to her arrival to ED.     Patient notes history of emotional abuse but denies avoidance, hyperarrousal symptoms of PTSD. Also reports prior OCD diagnosis however denies symptoms consistent with repetitive intrusive thoughts or images or spending more than 1 hour a day thinking about anything or engaging in repetitive behaviors.   6/20: patient denies SI, HI or AVH. Appears euthymic. There are multiple inconsistencies between patient's reported psychiatric history and what she reports, however she does appear euthymic at this time and participating in treatment including group therapy and interactive with peers. Patient admits to prior suicidal gestures however denies prior attempts. She was suicidal leading up to presention to ER but states it was due to [facility]ED medication which has now been discontinued. Holding patient for 72 hour period of observation to make sure she is safe. Patient adamantly declines any psychotropic medications.    Safety and Monitoring: INVOLUTARILY (second exam completed) admission to  inpatient psychiatric unit for safety, stabilization and treatment Daily contact with patient to assess and evaluate symptoms and progress in treatment Patient's case to be discussed in multi-disciplinary team meeting Observation Level : q15 minute checks Vital signs: q12 hours Precautions: suicide, elopement, and assault   2. Psychiatric Problems #unspecified depressive disorder  Patient declines any medications for mood or anxiety  Patient is denying symptoms consistent with PTSD or OCD   3. Medical Management Covid negative CMP: neg CBC: unremarkable EtOH: <10 UDS:neg TSH:  wnl       #seizure disorder - cont Topamax  100 mg BID after discussing with outpatient neurologist Dr. Georjean, patient also agreeable to taking this   Physician Treatment Plan for Primary Diagnosis: MDD (major depressive disorder) Long Term Goal(s): Improvement in symptoms so as ready for discharge   Short Term Goals: Ability to identify changes in lifestyle to reduce recurrence of condition will improve, Ability to verbalize feelings will improve, Ability to disclose and discuss suicidal ideas, Ability to demonstrate self-control will improve, Ability to identify and develop effective coping behaviors will improve, Ability to maintain clinical measurements within normal limits will improve, Compliance with prescribed medications will improve, and Ability to identify triggers associated with substance abuse/mental health issues will improve   Physician Treatment Plan for Secondary Diagnosis: Principal Problem:   MDD (major depressive disorder)    Khaleef Ruby, MD 08/23/2023, 9:00 PM

## 2023-08-23 NOTE — Progress Notes (Signed)
   08/23/23 2000  Psych Admission Type (Psych Patients Only)  Admission Status Involuntary  Psychosocial Assessment  Patient Complaints Anxiety  Eye Contact Fair  Facial Expression Animated  Affect Appropriate to circumstance  Speech Logical/coherent  Interaction Assertive  Motor Activity Slow  Appearance/Hygiene Unremarkable  Behavior Characteristics Appropriate to situation;Cooperative  Mood Depressed  Thought Process  Coherency WDL  Content WDL  Delusions None reported or observed  Perception WDL  Hallucination None reported or observed  Judgment WDL  Confusion None  Danger to Self  Current suicidal ideation? Denies  Description of Suicide Plan none  Agreement Not to Harm Self Yes  Description of Agreement verbal  Danger to Others  Danger to Others None reported or observed

## 2023-08-23 NOTE — BH IP Treatment Plan (Signed)
 Interdisciplinary Treatment and Diagnostic Plan Update  08/23/2023 Time of Session: 10:00 AM SHIFA BRISBON MRN: [id number]  Principal Diagnosis: MDD (major depressive disorder)  Secondary Diagnoses: Principal Problem:   MDD (major depressive disorder)   Current Medications:  Current Facility-Administered Medications  Medication Dose Route Frequency Provider Last Rate Last Admin   acetaminophen  (TYLENOL ) tablet 650 mg  650 mg Oral Q6H PRN Weber, Kyra A, NP   650 mg at 08/23/23 1350   alum & mag hydroxide-simeth (MAALOX/MYLANTA) 200-200-20 MG/5ML suspension 30 mL  30 mL Oral Q4H PRN Weber, Kyra A, NP       haloperidol (HALDOL) tablet 5 mg  5 mg Oral TID PRN Weber, Kyra A, NP       And   diphenhydrAMINE  (BENADRYL ) capsule 50 mg  50 mg Oral TID PRN Weber, Kyra A, NP       haloperidol lactate (HALDOL) injection 5 mg  5 mg Intramuscular TID PRN Weber, Kyra A, NP       And   diphenhydrAMINE  (BENADRYL ) injection 50 mg  50 mg Intramuscular TID PRN Weber, Kyra A, NP       And   LORazepam  (ATIVAN ) injection 2 mg  2 mg Intramuscular TID PRN Weber, Kyra A, NP       haloperidol lactate (HALDOL) injection 10 mg  10 mg Intramuscular TID PRN Weber, Kyra A, NP       And   diphenhydrAMINE  (BENADRYL ) injection 50 mg  50 mg Intramuscular TID PRN Weber, Kyra A, NP       And   LORazepam  (ATIVAN ) injection 2 mg  2 mg Intramuscular TID PRN Weber, Kyra A, NP       magnesium  hydroxide (MILK OF MAGNESIA) suspension 30 mL  30 mL Oral Daily PRN Weber, Kyra A, NP       topiramate  (TOPAMAX ) tablet 100 mg  100 mg Oral BID Zouev, Dmitri, MD   100 mg at 08/23/23 6213   PTA Medications: Medications Prior to Admission  Medication Sig Dispense Refill Last Dose/Taking   cloBAZam  (ONFI ) 10 MG tablet Take 1/2 tablet every night 30 tablet 5    rizatriptan  (MAXALT ) 10 MG tablet Take 1 tablet at onset of migraine. May repeat in 2 hours if needed. Do not take more than 3 a week 10 tablet 6    topiramate  (TOPAMAX ) 100  MG tablet Take 1 tablet in AM, 1 tablet in PM 180 tablet 3     Patient Stressors: Financial difficulties   Health problems   Marital or family conflict    Patient Strengths: Forensic psychologist fund of knowledge  Supportive family/friends   Treatment Modalities: Medication Management, Group therapy, Case management,  1 to 1 session with clinician, Psychoeducation, Recreational therapy.   Physician Treatment Plan for Primary Diagnosis: MDD (major depressive disorder) Long Term Goal(s): Improvement in symptoms so as ready for discharge   Short Term Goals: Ability to identify changes in lifestyle to reduce recurrence of condition will improve Ability to verbalize feelings will improve Ability to disclose and discuss suicidal ideas Ability to demonstrate self-control will improve Ability to identify and develop effective coping behaviors will improve Ability to maintain clinical measurements within normal limits will improve Compliance with prescribed medications will improve Ability to identify triggers associated with substance abuse/mental health issues will improve  Medication Management: Evaluate patient's response, side effects, and tolerance of medication regimen.  Therapeutic Interventions: 1 to 1 sessions, Unit Group sessions and Medication administration.  Evaluation of Outcomes:  Not Progressing  Physician Treatment Plan for Secondary Diagnosis: Principal Problem:   MDD (major depressive disorder)  Long Term Goal(s): Improvement in symptoms so as ready for discharge   Short Term Goals: Ability to identify changes in lifestyle to reduce recurrence of condition will improve Ability to verbalize feelings will improve Ability to disclose and discuss suicidal ideas Ability to demonstrate self-control will improve Ability to identify and develop effective coping behaviors will improve Ability to maintain clinical measurements within normal limits will  improve Compliance with prescribed medications will improve Ability to identify triggers associated with substance abuse/mental health issues will improve     Medication Management: Evaluate patient's response, side effects, and tolerance of medication regimen.  Therapeutic Interventions: 1 to 1 sessions, Unit Group sessions and Medication administration.  Evaluation of Outcomes: Not Progressing   RN Treatment Plan for Primary Diagnosis: MDD (major depressive disorder) Long Term Goal(s): Knowledge of disease and therapeutic regimen to maintain health will improve  Short Term Goals: Ability to remain free from injury will improve, Ability to verbalize frustration and anger appropriately will improve, Ability to demonstrate self-control, Ability to participate in decision making will improve, Ability to verbalize feelings will improve, Ability to disclose and discuss suicidal ideas, Ability to identify and develop effective coping behaviors will improve, and Compliance with prescribed medications will improve  Medication Management: RN will administer medications as ordered by provider, will assess and evaluate patient's response and provide education to patient for prescribed medication. RN will report any adverse and/or side effects to prescribing provider.  Therapeutic Interventions: 1 on 1 counseling sessions, Psychoeducation, Medication administration, Evaluate responses to treatment, Monitor vital signs and CBGs as ordered, Perform/monitor CIWA, COWS, AIMS and Fall Risk screenings as ordered, Perform wound care treatments as ordered.  Evaluation of Outcomes: Not Progressing   LCSW Treatment Plan for Primary Diagnosis: MDD (major depressive disorder) Long Term Goal(s): Safe transition to appropriate next level of care at discharge, Engage patient in therapeutic group addressing interpersonal concerns.  Short Term Goals: Engage patient in aftercare planning with referrals and resources,  Increase social support, Increase ability to appropriately verbalize feelings, Increase emotional regulation, Facilitate acceptance of mental health diagnosis and concerns, Facilitate patient progression through stages of change regarding substance use diagnoses and concerns, Identify triggers associated with mental health/substance abuse issues, and Increase skills for wellness and recovery  Therapeutic Interventions: Assess for all discharge needs, 1 to 1 time with Social worker, Explore available resources and support systems, Assess for adequacy in community support network, Educate family and significant other(s) on suicide prevention, Complete Psychosocial Assessment, Interpersonal group therapy.  Evaluation of Outcomes: Not Progressing   Progress in Treatment: Attending groups: Yes. Participating in groups: Yes. Taking medication as prescribed: Yes. Toleration medication: Yes. Family/Significant other contact made: [name] (boyfriend) [phone] Patient understands diagnosis: Yes. Discussing patient identified problems/goals with staff: Yes. Medical problems stabilized or resolved: Yes. Denies suicidal/homicidal ideation: Yes. Issues/concerns per patient self-inventory: No.  New problem(s) identified:  No  New Short Term/Long Term Goal(s):    medication stabilization, elimination of SI thoughts, development of comprehensive mental wellness plan.   Patient Goals:  I like the group meetings.  Discharge Plan or Barriers:  Patient recently admitted. CSW will continue to follow and assess for appropriate referrals and possible discharge planning.    Reason for Continuation of Hospitalization: Depression Medication stabilization Suicidal ideation  Estimated Length of Stay:  5 - 7 days  Last 3 [place] Suicide Severity Risk Score: Flowsheet Row Admission (  [facility] from 08[facility]20/2023 in [facility] ED from 04/06/2023 in [facility] Department [facility]  C-SSRS RISK CATEGORY Low Risk Low Risk No Risk    Last [facility] 2/9 Scores:    12/20/2022    2:59 PM 07/21/2019    4:10 PM 06/29/2019   11:33 AM  Depression screen PHQ 2/9  Decreased Interest 0 0 0  Down, Depressed, Hopeless 1 0 0  PHQ - 2 Score 1 0 0  Altered sleeping  0 0  Tired, decreased energy  0 0  Change in appetite  0 0  Feeling bad or failure about yourself   0 0  Trouble concentrating  0 0  Moving slowly or fidgety/restless  0 0  Suicidal thoughts  0 0  PHQ-9 Score  0 0    Scribe for Treatment Team: Pippa Hanif O Jaedin Regina, LCSWA 08/23/2023 5:23 PM

## 2023-08-23 NOTE — Plan of Care (Signed)
   Problem: Activity: Goal: Interest or engagement in activities will improve Outcome: Progressing   Problem: Coping: Goal: Ability to verbalize frustrations and anger appropriately will improve Outcome: Progressing   Problem: Safety: Goal: Periods of time without injury will increase Outcome: Progressing

## 2023-08-23 NOTE — Plan of Care (Signed)
  Problem: Activity: Goal: Interest or engagement in activities will improve 08/23/2023 1436 by Jeoffrey Mole, RN Outcome: Progressing 08/23/2023 1402 by Jeoffrey Mole, RN Outcome: Progressing   Problem: Education: Goal: Knowledge of [facility] Education information/materials will improve 08/23/2023 1436 by Jeoffrey Mole, RN Outcome: Progressing 08/23/2023 1402 by Jeoffrey Mole, RN Outcome: Progressing   Problem: Education: Goal: Mental status will improve 08/23/2023 1436 by Jeoffrey Mole, RN Outcome: Progressing 08/23/2023 1402 by Jeoffrey Mole, RN Outcome: Progressing

## 2023-08-23 NOTE — Group Note (Signed)
 Date:  08/23/2023 Time:  12:19 PM  Group Topic/Focus:  Wellness Toolbox:   The focus of this group is to discuss various aspects of wellness, balancing those aspects and exploring ways to increase the ability to experience wellness.  Patients will create a wellness toolbox for use upon discharge.    Participation Level:  Active  Participation Quality:  Appropriate and Attentive  Affect:  Appropriate  Cognitive:  Alert and Appropriate  Insight: Appropriate and Good  Engagement in Group:  Engaged  Modes of Intervention:  Education  Additional Comments:    Tita Form 08/23/2023, 12:19 PM

## 2023-08-23 NOTE — Progress Notes (Signed)
   08/22/23 2158  Psych Admission Type (Psych Patients Only)  Admission Status Involuntary  Psychosocial Assessment  Patient Complaints Anxiety;Depression  Eye Contact Fair  Facial Expression Anxious  Affect Anxious  Speech Logical/coherent  Interaction Assertive  Motor Activity Other (Comment) (WDL)  Appearance/Hygiene Unremarkable  Behavior Characteristics Appropriate to situation  Mood Depressed;Anxious  Thought Process  Coherency WDL  Content WDL  Delusions None reported or observed  Perception WDL  Judgment WDL  Confusion None  Danger to Self  Current suicidal ideation? Denies  Agreement Not to Harm Self Yes  Description of Agreement verbal  Danger to Others  Danger to Others None reported or observed

## 2023-08-23 NOTE — Group Note (Signed)
 Recreation Therapy Group Note   Group Topic:Team Building  Group Date: 08/23/2023 Start Time: 0935 End Time: 1005 Facilitators: Haydan Wedig-McCall, LRT,CTRS Location: [address] Dayroom   Group Topic: Communication, Team Building, Problem Solving  Goal Area(s) Addresses:  Patient will effectively work with peer towards shared goal.  Patient will identify skills used to make activity successful.  Patient will identify how skills used during activity can be applied to reach post d/c goals.   Behavioral Response: Engaged  Intervention: STEM Activity- Glass blower/designer  Activity: Tallest [employer]. In teams of 5-6, patients were given 11 craft pipe cleaners. Using the materials provided, patients were instructed to compete again the opposing team(s) to build the tallest free-standing structure from floor level. The activity was timed; difficulty increased by Clinical research associate as Production designer, theatre/television/film continued.  Systematically resources were removed with additional directions for example, placing one arm behind their back, working in silence, and shape stipulations. LRT facilitated post-activity discussion reviewing team processes and necessary communication skills involved in completion. Patients were encouraged to reflect how the skills utilized, or not utilized, in this activity can be incorporated to positively impact support systems post discharge.  Education: Pharmacist, community, Scientist, physiological, Discharge Planning   Education Outcome: Acknowledges education/In group clarification offered/Needs additional education.    Affect/Mood: Appropriate   Participation Level: Engaged   Participation Quality: Independent   Behavior: Appropriate   Speech/Thought Process: Focused   Insight: Good   Judgement: Good   Modes of Intervention: STEM Activity   Patient Response to Interventions:  Engaged   Education Outcome:  In group clarification offered    Clinical  Observations/Individualized Feedback: Pt attended and participated in group session.     Plan: Continue to engage patient in RT group sessions 2-3x/week.   Ilze Roselli-McCall, LRT,CTRS 08/23/2023 12:18 PM

## 2023-08-23 NOTE — Plan of Care (Signed)

## 2023-08-23 NOTE — [facility] Suicide Risk Assessment (Signed)
 [facility] INPATIENT:  Family/Significant Other Suicide Prevention Education  Suicide Prevention Education:  Education Completed; Natalie Lawrence (boyfriend) [phone],  (name of family member/significant other) has been identified by the patient as the family member/significant other with whom the patient will be residing, and identified as the person(s) who will aid the patient in the event of a mental health crisis (suicidal ideations/suicide attempt).  With written consent from the patient, the family member/significant other has been provided the following suicide prevention education, prior to the and/or following the discharge of the patient.  Natalie Lawrence reported patient will not have access to firearms/guns/weapons to harm himself or others. Natalie Lawrence confirmed he will be able to assist patient should she have a mental health crisis. Natalie Lawrence reported he has not concerns regarding patient's safety and her discharging home once stable.     The suicide prevention education provided includes the following: Suicide risk factors Suicide prevention and interventions National Suicide Hotline telephone number [facility] assessment telephone number [facility] Emergency Assistance 911 [facility] and/or Residential Mobile Crisis Unit telephone number  Request made of family/significant other to: Remove weapons (e.g., guns, rifles, knives), all items previously/currently identified as safety concern.   Remove drugs/medications (over-the-counter, prescriptions, illicit drugs), all items previously/currently identified as a safety concern.  The family member/significant other verbalizes understanding of the suicide prevention education information provided.  The family member/significant other agrees to remove the items of safety concern listed above.  Natalie Lawrence M Natalie Lawrence 08/23/2023, 10:40 AM

## 2023-08-23 NOTE — Group Note (Unsigned)
 Date:  08/23/2023 Time:  9:22 AM  Group Topic/Focus:  Goals Group:   The focus of this group is to help patients establish daily goals to achieve during treatment and discuss how the patient can incorporate goal setting into their daily lives to aide in recovery.     Participation Level:  {B[facility] PARTICIPATION DGUYQ:03474}  Participation Quality:  {B[facility] PARTICIPATION QUALITY:22265}  Affect:  {B[facility] AFFECT:22266}  Cognitive:  {[facility] COGNITIVE:22267}  Insight: {BHH Insight2:20797}  Engagement in Group:  {B[facility] ENGAGEMENT IN QVZDG:38756}  Modes of Intervention:  {B[facility] MODES OF INTERVENTION:22269}  Additional Comments:  ***  Tita Form 08/23/2023, 9:22 AM

## 2023-08-24 DIAGNOSIS — F32A Depression, unspecified: Principal | ICD-10-CM

## 2023-08-24 DIAGNOSIS — F419 Anxiety disorder, unspecified: Secondary | ICD-10-CM | POA: Insufficient documentation

## 2023-08-24 NOTE — Progress Notes (Addendum)
 Called patient's significant other Natalie Lawrence with her permission [phone]). I listened to Natalie Lawrence's complaints about hospitalization including that ER provider had started patient back on her antiepileptic medication that they note had been making her suicidal, that he was unable to reach her over the phone.   We spoke for 15-20 minutes and patient continued to berate and threaten me throughout the whole conversation stating she should have never been held for admission despite me explaining to him multiple risk factors for suicide including prior attempts/gestures documented. I told Natalie Lawrence I was sorry they had such a negative experience at [facility] and encouraged him to file a complaint/grievance at which point he started to threaten legal action and that oh it won't just be a complaint. When I initially asked if he had knowledge of any prior suicide attempts he was rude and asked well what do you mean by attempts? When I explain what I classify as suicide attempts, namely patient trying to kill herself in the past even if it was aborted by another individual, he responded sarcastically and asked why are you explaining that to me? I detailed to significant other patient's hospital course  from day of admission to the ER to days held on [facility] and rational for this. He continued to berate and was irritable and loud over the phone.   Significant other perseverated on berating me most of conversation which I told him was inappropriate  to try to reestablish boundaries and noted that he had done the same with our social workers the last 2 days, yelling over the phone and making inappropriate demands that she be discharged immediately as well as complaining on perceived wrongdoing throughout hospitalization like her belongings being placed in security which I explained is part of normal protocol for patients that are suicidal. He stated he wants all records of her evaluations. I encouraged patient and SO to  file a release through the office. Natalie Lawrence continued to berate me and threaten me with lawsuits. Of note patient' significant other has behaved similarly on calls with social workers when I discussed patient's case and collateral during team meetings the past 2 days.  I had to end the conversation and phone call as significant other was not listening to what I was telling him and would not calm down his tone as I had requested. I notified him that the conversation was not trending in any useful or productive fashion. Discussed safety planning both with SO and patient and that they should call crisis line if she develops SI or to go to the nearest ER.  Nursing staff provide patient with medical records release number.  Natalie Lawrence denies access to guns or weapons at home and has not safety concerns with patient being discharged today. Patient denies that SO is in any way abusive to her and denies any safety concerns at home. States she is not fearful in any way of Natalie Lawrence and that he is supportive. Denies SI, HI or AVH.

## 2023-08-24 NOTE — Plan of Care (Signed)
   Problem: Education: Goal: Emotional status will improve Outcome: Progressing Goal: Mental status will improve Outcome: Progressing   Problem: Activity: Goal: Interest or engagement in activities will improve Outcome: Progressing

## 2023-08-24 NOTE — [facility] Suicide Risk Assessment (Signed)
 [facility] Discharge Suicide Risk Assessment   Principal Problem: Depression Discharge Diagnoses: Principal Problem:   Depression   Total Time spent with patient: 30 minutes  Musculoskeletal: Strength & Muscle Tone: within normal limits Gait & Station: normal Patient leans: N/A  Psychiatric Specialty Exam  Presentation  General Appearance:  Appropriate for Environment  Eye Contact: Good  Speech: Clear and Coherent  Speech Volume: Normal  Handedness:No data recorded  Mood and Affect  Mood: Euthymic  Duration of Depression Symptoms: Greater than two weeks  Affect: Appropriate   Thought Process  Thought Processes: Coherent  Descriptions of Associations:Intact  Orientation:Full (Time, Place and Person)  Thought Content:Logical  History of Schizophrenia/Schizoaffective disorder:No  Duration of Psychotic Symptoms:No data recorded Hallucinations:Hallucinations: None  Ideas of Reference:None  Suicidal Thoughts:Suicidal Thoughts: No  Homicidal Thoughts:Homicidal Thoughts: No   Sensorium  Memory: Immediate Fair  Judgment: Fair  Insight: Fair   Art therapist  Concentration: Fair  Attention Span: Fair  Recall: [employer] of Knowledge: Fair  Language: Fair   Psychomotor Activity  Psychomotor Activity: Psychomotor Activity: Normal   Assets  Assets: Communication Skills; Desire for Improvement; Intimacy; Social Support; Health and safety inspector   Sleep  Sleep: Sleep: Fair  Estimated Sleeping Duration (Last 24 Hours): 6.25-7.00 hours  Physical Exam: Physical Exam ROS Blood pressure 110/89, pulse 82, temperature 98.5 F (36.9 C), temperature source Oral, resp. rate 18, height 5' 6 (1.676 m), weight 102.5 kg, SpO2 100%, unknown if currently breastfeeding. Body mass index is 36.48 kg/m.  Mental Status Per Nursing Assessment::   On Admission:  Self-harm behaviors, Self-harm thoughts, Suicidal ideation indicated by  patient  Demographic Factors:  Low socioeconomic status  Loss Factors: Financial problems/change in socioeconomic status  Historical Factors: Prior suicide attempts  Risk Reduction Factors:   Responsible for children under 69 years of age, Sense of responsibility to family, Positive social support, Positive therapeutic relationship, and Positive coping skills or problem solving skills  Continued Clinical Symptoms:  Previous Psychiatric Diagnoses and Treatments  Cognitive Features That Contribute To Risk:  None    Suicide Risk:  Minimal: No identifiable suicidal ideation.  Patients presenting with no risk factors but with morbid ruminations; may be classified as minimal risk based on the severity of the depressive symptoms  Patient denies SI or HI for >48 hours. Denies wanting to be dead and future oriented. SRA complete and acute risk for suicide is low.   [place] Suicide Risk assessment:  1. Do you wish to be dead? NONE REPORTED 2. Have you wished your dead or wished you could go to sleep and not wake up? NONE REPORTED 3.  Have you actually had thoughts of killing yourself?  NONE REPORTED 4.  Have you been thinking about how you might do this?  NONE REPORTED 5.  Have you had these thoughts and some intention of acting on them? NONE REPORTED 6.  Have you started to work out or worked out the details to kill yourself? NONE REPORTED 7.  Do you intend to carry out this plan? NONE REPORTED 8. On a scale of 1-5 with 1 being the least severe and 5 being the most severe answer the following questions place for intensity of ideation. ZERO 9. How many times have you had these thoughts? NONE REPORTED 10. When you have the thoughts how long to the last?  NONE REPORTED 11. Control ability.  Could you or can you stop thinking about killing herself or wanting to die if you want to?  YES  12. Are there any things anyone or anything family religion pain of death that stop you from wanting to die  or acting on thoughts of committing suicide?  FAMILY 13.  What sort of reason to do have to think about wanting to die or killing yourself? NONE REPORTED 14.Was it to end the pain or stop the way you are feeling in other words you could not go on living with his pain or how you are feeling or was not to get attention revenge or reaction from others?  Or both?  NONE REPORTED  [place] Suicide Risk assessment:  1. Do you wish to be dead? NONE REPORTED 2. Have you wished your dead or wished you could go to sleep and not wake up? NONE REPORTED 3.  Have you actually had thoughts of killing yourself?  NONE REPORTED 4.  Have you been thinking about how you might do this?  NONE REPORTED 5.  Have you had these thoughts and some intention of acting on them? NONE REPORTED 6.  Have you started to work out or worked out the details to kill yourself? NONE REPORTED 7.  Do you intend to carry out this plan? NONE REPORTED 8. On a scale of 1-5 with 1 being the least severe and 5 being the most severe answer the following questions place for intensity of ideation. ZERO 9. How many times have you had these thoughts? NONE REPORTED 10. When you have the thoughts how long to the last?  NONE REPORTED 11. Control ability.  Could you or can you stop thinking about killing herself or wanting to die if you want to?  YES 12. Are there any things anyone or anything family religion pain of death that stop you from wanting to die or acting on thoughts of committing suicide?  FAMILY and my kids 29.  What sort of reason to do have to think about wanting to die or killing yourself? NONE REPORTED 14.Was it to end the pain or stop the way you are feeling in other words you could not go on living with his pain or how you are feeling or was not to get attention revenge or reaction from others?  Or both?  NONE REPORTED    Follow-up Information     [facility] Follow up on 09/03/2023.   Why: You have an  appointment for medication management services on 09/03/23 at 3:30 pm, Virtual.  You have an appointment for therapy services on 09/11/23 at 11:30 am, Virtual. Contact information: [address] [employer] [address] [place], [place] [address]   P: [phone]                Plan Of Care/Follow-up recommendations:  - outpatient psychiatric followup as above  Nuvia Hileman, MD 08/24/2023, 10:37 AM

## 2023-08-24 NOTE — Progress Notes (Signed)
 Adult Psychoeducational Group Note  Date:  08/24/2023 Time:  9:50 AM  Group Topic/Focus:  Goals Group:   The focus of this group is to help patients establish daily goals to achieve during treatment and discuss how the patient can incorporate goal setting into their daily lives to aide in recovery.  Participation Level:  Active  Participation Quality:  Appropriate  Affect:  Appropriate  Cognitive:  Appropriate  Insight: Appropriate  Engagement in Group:  Engaged  Modes of Intervention:  Discussion  Additional Comments:  Pt stated he is feeling good.  Pt goal for the day was to stay positive.  Daine Pillar D 08/24/2023, 9:50 AM

## 2023-08-24 NOTE — Progress Notes (Signed)
Pt discharged to lobby. Pt was stable and appreciative at that time. All papers and electronic prescriptions were given and valuables returned. Suicide safety plan completed. Verbal understanding expressed. Denies SI/HI and A/VH. Pt given opportunity to express concerns and ask questions. 

## 2023-08-24 NOTE — Progress Notes (Signed)
  [facility] Adult Case Management Discharge Plan :  Will you be returning to the same living situation after discharge:  Yes,  patient will return home with boyfriend Joane List At discharge, do you have transportation home?: Yes,  Boyfriend-Corey Badgett will transport patient home Do you have the ability to pay for your medications: Yes,  patient covered by insurance  Release of information consent forms completed and in the chart;  Patient's signature needed at discharge.  Patient to Follow up at:  Follow-up Information     [facility] Follow up on 09/03/2023.   Why: You have an appointment for medication management services on 09/03/23 at 3:30 pm, Virtual.  You have an appointment for therapy services on 09/11/23 at 11:30 am, Virtual. Contact information: [address] [employer] [place], [place] [address]   P: [phone]                Next level of care provider has access to [facility] Link:no  Safety Planning and Suicide Prevention discussed: Yes,  completed 08/23/23 with Joane List     Has patient been referred to the Quitline?: Patient does not use tobacco/nicotine products  Patient has been referred for addiction treatment: No known substance use disorder.  [address] [place], [place] 08/24/2023, 11:48 AM

## 2023-08-24 NOTE — Discharge Summary (Signed)
 Physician Discharge Summary Note  Patient:  Natalie Lawrence is an 33 y.o., female MRN:  [id number] DOB:  1990-04-26 Patient phone:  [phone] (home)  Patient address:   [address] [place] [address],  Total Time spent with patient: 30 minutes  Date of Admission:  08/21/2023 Date of Discharge: 08/24/23  Reason for Admission:   As per H&P: 33 year old female presented as a voluntary walk-in to [facility] due to SI with plan to use a knife. Patient reports history of depression and Epilepsy. Patient denied HI, psychosis and alcohol/drug usage.    Per mental health counselor assessment yesterday: Patient reports onset of SI approx 2 weeks. Patient reports earlier today she was suicidal and ran to try and grab a knife to hurt herself and her children's father stopped her by grabbing the knife. Patient reports stressors/triggers includes her epilepsy/seizures, I feel like a little kid asking for permission all the kind, my family want let me do things alone and always wants someone with me, that's why I live with my children's father sometimes because he is the only one that knows what to do if I have a seizure and what to do to keep me safe. Patient reports worsening depressive symptoms. Patient reports her sleep varies and that her appetite is normal.   Patient reports multiple suicide attempts with last one 8 years when she attempted to jump off a 4th story balcony and someone stopped her before she did it. Patient also reports other suicide attempts in the past included, cutting herself and attempting to overdose on pills. Patient reports history of self-harming behaviors of cutting herself years ago, patient unable to recall timeframe.  Patient is currently receiving therapy and medication management with [facility]. Patient reports she is not taking any psych medications at this time. Patient reports her next therapy appointment is on Friday and that she is seen 1x  weekly. Patient resides reports she and her 2 children (4 and 6) live between homes, with family member and her children's father.  Patient reports her other 3 children (44 and 87 year old twins) live with their father in another state. Patient reports being unemployed and reports having a disability hearing 10/2023. Patient denied access to guns. Patient was anxious and cooperative during assessment.    PER ED note:  presents tearful in Triage with her childs father on speaker phone screaming at her and yelling at her over the phone. Pt reports her child's father weaponizes the kids make her feel suicidal. Pt reports struggling with her seizures and reports constantly being called crazy and reports her childs father makes her feel scared and feel like she has no freedom. Pt reports she has been struggling with mental health for years. Pt reports she was banging her head on the wall and attempted to get a knife to kill herself in the kitchen, but was stopped.  Pt tearful, and wishes to be able to take her kids to live with family in [place] .     Per nursing note from admission: Patient admitted for attempting to stab self with knife. Per patient she had been experiencing an increase in seizure activity over the past few weeks, and moved in with her children's father so he could assist with her health condition. Following a verbal altercation between the two, patient began to bang her head on the floor and wall uncontrolably. When sig other attempt to stop her, she broke free from him and ran to  the kitchen to grab a knife to cut herself. Per patient she has had other attempts as suicide in the past by OD, cutting and attempting to jump off of 4th story building. The couple have a 16yr and 35yr old boys between them, which patient states their father weaponizes the children against because of her seizure disorder. She names her seizures, and changes in medication as major stressor, and what she wants  to work on while she is here. Describes feeling crazy, and hopeless due to disorder. She experiences seizures in warm temperatures and requires a cool environment for comfort. Prescribed Onfi  by her provider, but feels like it contributed to her current SI episode. Patient presents logical/linear, tearful, with depressive affect. She denies current thoughts of SI/HI, AVH. Admission information discussed with patient, understanding verbalized. Patient oriented to the unit and shown to the room.    Patient seen for evaluation and states above information is not correct. Patient is euthymic at time of evaluation. States she has not been feeling depressed for week. States she has not had prior suicide attempt but more than 10 years ago when I was dealing with some emotional abuse with my family, I had thoughts of jumping off the 4th story but I didn't. States she had thoughts of overdosing in the past but did not overdose and had thoughts of cutting but never cut herself. Denies any history of sutures. Patient notes she had a suicidal gesture like she was going to grab a knife leading up to hospitalization however states she never actually got a knife in her hand. Patient denies depression, denies anhedonia and states she enjoys spending time with her children. Patient is seen interacting with peers on the unit. Patient states she has never been admitted to inpatient psychiatry previously. Patient states she became suicidal after taking seizure medication and no longer feels suicidal. States she wants to live for her children. Denies any abuse or fear of significant other. She reports she works with a Theme park manager and with a a therapist for couples counseling the last several months. Does report that at one point she was prescribed sertraline but never took it and states she does not wish to take any psychotropic medications. Patient is agreeable to groups and notes benefit from groups she has gone to  since yesterday. Reports good sleep and appetite. Denies feeling hopeless or helpless. Adamantly denies SI the past 2 weeks and states that's a mistake I had suicidal thoughts just 2 days before I got here. Denies SI today. Patient denies that her significant other screams at her and states this was an error and he is supportive.      Associated Signs/Symptoms: Depression Symptoms:  depressed mood, Duration of Depression Symptoms: patient states 2 days prior to admission and due to Clobazem her AED which DR. Georjean messaged me we can discontinue.   (Hypo) Manic Symptoms:  denies history of manic episodes Anxiety Symptoms:  Excessive Worry, Psychotic Symptoms:  denies AVH, denies paranoid delusions PTSD Symptoms: Had a traumatic exposure:  reports emotional abuse by family 10 years ago but states she no longer has nightmares, flashbacks, hypervigilance or avoidance Re-experiencing:  None Hypervigilance:  No Hyperarousal:  None Avoidance:  None  Principal Problem: Depression Discharge Diagnoses: Principal Problem:   Depression Active Problems:   Anxiety disorder, unspecified   Past Psychiatric History:  Previous Psych Diagnoses:  depression, anxiety (patient reports prior diagnoses of OCD and PTSD which she goes to therapy for but denies symptoms consistent  with OCD or PTSD at this time) Prior inpatient treatment: patient denies Current/prior outpatient treatment: yes, therapist and psychiatrist but states she has never taken medications, was prescribed Zoloft for PTSD previously but does not wish to take it Prior rehab [id number] Psychotherapy hx: currently receiving therapy and medication management with [facility]. History of suicide: multiple suicidal gestures with last one 8 years when she states she was thinking about jumping from 4th story but didn't do it. Patient reported in ED cutting herself and attempting to overdose on pills however to me states that his  never happened and that she stopped herself. Patient reports history of self-harming behaviors of cutting herself years ago but states it was superficical History of homicide: denies Psychiatric medication history: denies Neuromodulation history:  Current Psychiatrist: denies  Past Medical History:  Past Medical History:  Diagnosis Date   Anxiety    Depression 2021   Epilepsy (HCC)    Epilepsy (HCC) 04/2013   GBS (group B Streptococcus carrier), +RV culture, currently pregnant 07/01/2019   Seizures (HCC)     Past Surgical History:  Procedure Laterality Date   CESAREAN SECTION  2012   TUBAL LIGATION Bilateral 07/03/2019   Procedure: POST PARTUM TUBAL LIGATION;  Surgeon: Lorence Ozell CROME, MD;  Location: [facility];  Service: Gynecology;  Laterality: Bilateral;   Family History:  Family History  Problem Relation Age of Onset   Diabetes Father    High blood pressure Father    Kidney disease Father        2 kdney transplants   Hypertension Paternal Grandmother    Diabetes Paternal Grandmother    Breast cancer Paternal Grandmother    Seizures Paternal Grandmother    Arthritis Paternal Grandmother    Asthma Paternal Grandmother    High Cholesterol Paternal Grandmother    Hypertension Paternal Grandfather    Arthritis Paternal Grandfather    Diabetes Paternal Grandfather    High Cholesterol Paternal Grandfather    Family Psychiatric  History:   Psych: denies Psych Rx: denies SA/HA: denies Substance use family hx: denies Social History:  Social History   Substance and Sexual Activity  Alcohol Use No     Social History   Substance and Sexual Activity  Drug Use No   Comment: h/o smoking marijuana     Social History   Socioeconomic History   Marital status: Single    Spouse name: Not on file   Number of children: 4   Years of education: Not on file   Highest education level: 12th grade  Occupational History   Not on file  Tobacco Use   Smoking status: Former     Types: Cigarettes    Passive exposure: Past   Smokeless tobacco: Never  Vaping Use   Vaping status: Never Used  Substance and Sexual Activity   Alcohol use: No   Drug use: No    Comment: h/o smoking marijuana    Sexual activity: Yes  Other Topics Concern   Not on file  Social History Narrative   Lives with boyfriend and children in a one story home   Right handed   Caffeine: none   Social Drivers of Corporate investment banker Strain: Low Risk  (12/20/2022)   Overall Financial Resource Strain (CARDIA)    Difficulty of Paying Living Expenses: Not hard at all  Food Insecurity: No Food Insecurity (08/22/2023)   Hunger Vital Sign    Worried About Running Out of Food in the Last  Year: Never true    Ran Out of Food in the Last Year: Never true  Transportation Needs: No Transportation Needs (08/22/2023)   PRAPARE - Administrator, Civil Service (Medical): No    Lack of Transportation (Non-Medical): No  Physical Activity: Insufficiently Active (12/20/2022)   Exercise Vital Sign    Days of Exercise per Week: 2 days    Minutes of Exercise per Session: 10 min  Stress: Stress Concern Present (12/20/2022)   [employer] of Occupational Health - Occupational Stress Questionnaire    Feeling of Stress : Very much  Social Connections: Unknown (12/20/2022)   Social Connection and Isolation Panel    Frequency of Communication with Friends and Family: Twice a week    Frequency of Social Gatherings with Friends and Family: Once a week    Attends Religious Services: Patient declined    Database administrator or Organizations: No    Attends Engineer, structural: Not on file    Marital Status: Never married    Hospital Course:   Patient was admitted to inpatient psychiatry at [facility] for safety and stabilization. Patient was provided safe and therapeutic milieu, psychiatric and medical assessment, care and treatment, as well as support from nursing, behavioral  health staff. Both psychotherapy and psychoeducation groups were provided. Different coping skills such as journaling, CBT and art therapy groups were offered. Additional consultation was provided by hospitalist for H&P and medical needs.  Patient presenting to hospital after having suicidal thought of grabbing a knife with her significant other stopping her before she got to knife. Patient declined to start any medications for depression or anxiety, stating that the suicidality she had been experiencing was due to an antiepileptic medication. Patient attended group therapy for 2 days and was euthymic and denied all symptoms of MDD and GAD. There was concern due to multiple inconsistencies given to multiple providers and patient's psychiatric history changing from multiple suicidal gestures from previously reported suicide attempts. Patient however appeared euthymic for >48 hours with good sleep, appetite, energy. Denied anhedonia, difficulty concentrating. Patient did not exhibit any symptoms of mania or psychosis.  Patient was interactive with peers and did not appear severely depressed. She classified her thoughts of wanting to grab a knife as a suicidal gesture and states she did it for attention when I asked her on day of discharge. Also stated she engaged in prior suicidal gestures for attention and stated that her significant other was always there for her. Stated that she never actually overdosed or jumped off the fourth story as she had reported to ED provider. I discussed cluster B traits with patient however she denied that she had symptoms of borderline personality disorder.    On the day of discharge, the chart was reviewed, case was discussed with staff and the patient was seen in person. Patient's overall mood has improved, patient reports feeling good.... I just want to see my children. Patient was seen on the phone the last 2 days calling her children. She has been attending groups and  notes benefit from therapy. Adamantly declines medications stating she is not clinically depressed or anxious. Patient was calm and cooperative and did not appear anxious. Patient reported adequate sleep and stable mood. Patient was tolerating medications well without side effects reported or noted. Patient denied suicidal ideation, plan or intent, denied hopelessness, helplessness or worthlessness, and denied homicidal ideation. Insight and judgement have improved. Patient demonstrated future orientation and was motivated to  follow-up with aftercare. Patient was encouraged to be adherent with medications. Patient was instructed to call 911, ask for help to go to the closest emergency room or crisis center, call crisis hotlines for help if in critical status or when symptoms were worsening. Patient voiced understanding of this information. At the time of discharge, patient had reached maximum benefit from hospitalization, was no longer considered to be dangerous to self or others, and was psychiatrically stable and otherwise appropriate for discharge to less restrictive care in the community.   Medical Hospital Course: Patient was seen by the hospitalist for routine admission examination. Medications for chronic conditions were continued.  Medical hospital course was otherwise unremarkable.    Musculoskeletal: Strength & Muscle Tone: within normal limits Gait & Station: normal Patient leans: N/A   Psychiatric Specialty Exam:  Presentation  General Appearance:  Appropriate for Environment  Eye Contact: Good  Speech: Clear and Coherent  Speech Volume: Normal  Handedness:No data recorded  Mood and Affect  Mood: Euthymic  Affect: Appropriate   Thought Process  Thought Processes: Coherent  Descriptions of Associations:Intact  Orientation:Full (Time, Place and Person)  Thought Content:Logical  History of Schizophrenia/Schizoaffective disorder:No  Duration of Psychotic  Symptoms:No data recorded Hallucinations:Hallucinations: None  Ideas of Reference:None  Suicidal Thoughts:Suicidal Thoughts: No  Homicidal Thoughts:Homicidal Thoughts: No   Sensorium  Memory: Immediate Fair  Judgment: Fair  Insight: Fair   Art therapist  Concentration: Fair  Attention Span: Fair  Recall: [employer] of Knowledge: Fair  Language: Fair   Psychomotor Activity  Psychomotor Activity: Psychomotor Activity: Normal   Assets  Assets: Communication Skills; Desire for Improvement; Intimacy; Social Support; Health and safety inspector   Sleep  Sleep: Sleep: Fair  Estimated Sleeping Duration (Last 24 Hours): 6.25-7.00 hours   Physical Exam: General: Well developed, overweight [place] female Pupils: Normal at 3mm Respiratory: Breathing is unlabored.  Cardiovascular: No edema.  Language: No anomia, no aphasia Muscle strength and tone-pt moving all extremities.  Gait not assessed as pt remained in bed.  Neuro: Facial muscles are symmetric. Pt without tremor, no evidence of hyperarousal.   Review of Systems  Constitutional: Negative.   HENT: Negative.    Eyes: Negative.   Respiratory: Negative.    Cardiovascular: Negative.   Gastrointestinal: Negative.   Genitourinary: Negative.   Musculoskeletal: Negative.   Skin: Negative.   Neurological: Negative.   Endo/Heme/Allergies: Negative.   Psychiatric/Behavioral:  negative for depression and negative for suicidal ideas. The patient denies anxiety     Blood pressure 110/89, pulse 82, temperature 98.5 F (36.9 C), temperature source Oral, resp. rate 18, height 5' 6 (1.676 m), weight 102.5 kg, SpO2 100%, unknown if currently breastfeeding. Body mass index is 36.48 kg/m.   Social History   Tobacco Use  Smoking Status Former   Types: Cigarettes   Passive exposure: Past  Smokeless Tobacco Never   Tobacco Cessation:  N/A, patient does not currently use tobacco  products   Blood Alcohol level:  Lab Results  Component Value Date   [facility] <15 08/20/2023   ETH <10 05/16/2018    Metabolic Disorder Labs:  Lab Results  Component Value Date   HGBA1C 5.0 05/19/2019   No results found for: PROLACTIN No results found for: CHOL, TRIG, HDL, CHOLHDL, VLDL, LDLCALC  See Psychiatric Specialty Exam and Suicide Risk Assessment completed by Attending Physician prior to discharge.  Discharge destination:  Home  Is patient on multiple antipsychotic therapies at discharge:  No   Has Patient had three  or more failed trials of antipsychotic monotherapy by history:  No  Recommended Plan for Multiple Antipsychotic Therapies: NA   Allergies as of 08/24/2023   No Known Allergies      Medication List     STOP taking these medications    cloBAZam  10 MG tablet Commonly known as: ONFI        TAKE these medications      Indication  rizatriptan  10 MG tablet Commonly known as: Maxalt  Take 1 tablet at onset of migraine. May repeat in 2 hours if needed. Do not take more than 3 a week  Indication: Migraine Headache, per neurologist   topiramate  100 MG tablet Commonly known as: TOPAMAX  Take 1 tablet in AM, 1 tablet in PM  Indication: per neurologist for epilepsy        Follow-up Information     [facility] Follow up on 09/03/2023.   Why: You have an appointment for medication management services on 09/03/23 at 3:30 pm, Virtual.  You have an appointment for therapy services on 09/11/23 at 11:30 am, Virtual. Contact information: [address] [place], [place] [address]   P: [phone]                Follow-up recommendations:  outpatient psychotherapy referral as above  Signed: Ayshia Gramlich, MD 08/24/2023, 2:26 PM

## 2023-08-24 NOTE — Progress Notes (Signed)
   08/24/23 1000  Psych Admission Type (Psych Patients Only)  Admission Status Involuntary  Psychosocial Assessment  Patient Complaints None  Eye Contact Fair  Facial Expression Anxious  Affect Appropriate to circumstance  Speech Logical/coherent  Interaction Assertive  Motor Activity Other (Comment) (level 3 observation)  Appearance/Hygiene Unremarkable  Behavior Characteristics Cooperative;Appropriate to situation  Mood Pleasant  Thought Process  Coherency WDL  Content WDL  Delusions None reported or observed  Perception WDL  Hallucination None reported or observed  Judgment WDL  Confusion None  Danger to Self  Current suicidal ideation? Denies  Danger to Others  Danger to Others None reported or observed

## 2023-08-24 NOTE — Group Note (Signed)
 LCSW Group Therapy Note 08/24/2023 Type of Therapy and Topic:  Group Therapy: Gratitude Participation Level:  Active Description of Group:   In this group, patients shared and discussed a positive experience that had beneficial impact on their life and those around them.  The group discussed how bringing the positive elements of their lives to the forefront of their minds can help with recovery from a physical or mental illness.  An exercise was done as a group to state mindfulness routines in order to reduce stress or anxiety level encourage participants to consider other potential positives in their lives.  Therapeutic Goals: Patients will participate by sharing a positive or impactful event that assisted to increase self-awareness and self-esteem.  Patients will discuss how to apply mindfulness technique with any situations. Patients will explore other coping skills to utilize during emotional dysregulation to assist with their daily tasks.   Summary of Patient Progress:  The patient shared their memories of successful event when client attend her therapy session and is grateful for going to therapy regularly.  The patient was engage and was responsive to group members. Therapeutic Modalities:   Solution-Focused Therapy Activity  Natalie Lawrence Natalie Lawrence, LCSWA 08/24/2023  1:31 PM

## 2023-09-12 ENCOUNTER — Encounter: Payer: Self-pay | Admitting: Neurology

## 2023-09-12 NOTE — Telephone Encounter (Signed)
Patient scheduled for 6/15

## 2023-09-17 ENCOUNTER — Encounter: Payer: Self-pay | Admitting: Neurology

## 2023-09-17 ENCOUNTER — Telehealth (INDEPENDENT_AMBULATORY_CARE_PROVIDER_SITE_OTHER): Admitting: Neurology

## 2023-09-17 VITALS — Ht 66.0 in | Wt 226.0 lb

## 2023-09-17 DIAGNOSIS — G40009 Localization-related (focal) (partial) idiopathic epilepsy and epileptic syndromes with seizures of localized onset, not intractable, without status epilepticus: Secondary | ICD-10-CM | POA: Diagnosis not present

## 2023-09-17 DIAGNOSIS — G43009 Migraine without aura, not intractable, without status migrainosus: Secondary | ICD-10-CM

## 2023-09-17 MED ORDER — DIVALPROEX SODIUM ER 250 MG PO TB24
ORAL_TABLET | ORAL | 6 refills | Status: DC
Start: 2023-09-17 — End: 2023-10-29

## 2023-09-17 NOTE — Progress Notes (Unsigned)
 Doing good, feeling time has picked up, feel it coming then go away, almost every 30 mins yesterday 7/10: morning getting up from sleep and fiance said started making clicking gargling noise, convulsing, confused after, had migraine after on the right side; sz in the evening, sitting after cooking dinner, felt the feelings coming, put wash cloth and symptosm stopped; then lost awareness, body twitching, not responding  6/18-6/21: SI approx 2 weeks. Patient reports earlier today she was suicidal and ran to try and grab a knife to hurt herself and her children's father stopped her by grabbing the knife.  Patient reports multiple suicide attempts with last one 8 years when she attempted to jump off a 4th story balcony and someone stopped her before she did it. Patient also reports other suicide attempts in the past included, cutting herself and attempting to overdose on pills.   Tubes tied Denies any stress at home  I had the pleasure of seeing Natalie Lawrence in follow-up in the neurology clinic on 07/15/2023.  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 She is alone in the office today.  Records and images were personally reviewed where available.  She contacted our office about a seizure on 3/17 while in the car, then again after she got out and fell on the stairs. She reported a different type of seizure on 4/17 where she was told it was like she was speaking a different language after it happened. She had 3 seizures on 4/28, one was nocturnal, she bit both sides of her tongue and reported a headache. She reports the Epidiolex  caused side effects. Clobazam  was added to Topiramate  100mg  BID, however she reports she has been unable to afford the $4 co-pay and has not started it yet. No seizures since 4/28 but she woke up dizzy and has been intermittently feeling it. She is not sure if it is the weather, she has been taking it slow. She is feeling okay right now. She  continues to report migraines, taking Maxalt  three times a week on average. It eases off the edge but does not do too much. No falls.    History on Initial Assessment 08/28/2019: The patient was seen as a virtual video visit on 08/28/2019. She has seen several neurologists, records have been reviewed and will be summarized as follows. She is a 33 year old right-handed woman with a history of seizures diagnosed at age 73. She states majority of her seizures are nocturnal. The first seizure occurred in 2015. She is noted to have convulsions with head version to the left. She would feel hot and cold flashes followed by dizziness prior to the convulsions. She has had only 2-3 seizures during wakefulness, last occurred a year ago. Sometimes she gets a warning up to 1 hour prior to the convulsions. She is confused and amnestic of events, sleepy after, with headaches and jaw pain. No focal weakness. She had been seeing Dr. Maree at [facility] from 2018-2019, then Dr. Ines last 05/2019 on the third trimester of her pregnancy. On her visit with Dr. Ines, she was noted to have seizures in February, March, she reports a seizure at the end of May/beginning of June, then her last seizure was 08/18/19. Triggers include heat, stress, when she is overworked. She has occasional episodes where she feels very hot and dizzy like she will fall. She denies any olfactory/gustatory hallucinations, deja vu, rising epigastric sensation, focal numbness/tingling/weakness, myoclonic jerks. She denies any staring/unresponsive  episodes or gaps in time. She has tried Levetiracetam , Lamotrigine, carbamazepine, oxcarbazepine, phenytoin. She was last prescribed Levetiracetam  during her visit with Dr. Ines in 05/2019, however she would start and stop it during her pregnancies, she is not taking seizure medication. She states it was the 4th or 5th medication, the worst I've been on. She does not think it works, she feels spaced out and her  depth perception would be different.   She has migraines 4-5 times a month, mostly on the right side with occasional nausea. She does not take any prn medications. She denies any diplopia, dysarthria/dysphagia, neck/back pain, bowel/bladder dysfunction. She feels her memory started to fade after seizures, she could not remember things from 2 days ago. She has 2 children, a 73 and a half year old and her 59 month old baby. She is breastfeeding and does not want to start any seizure medications at this time. She has no future pregnancy plans, she has had tubal ligation.  Prior AEDs: Levetiracetam , Lamotrigine, oxcarbazepine, Phenytoin, carbamazepine, Zonisamide , Xcopri , Epidiolex   Diagnostic Data: EEGs: Routine EEG in 01/2016 showed diffuse beta activity, otherwise normal.  72-hour EEG in 12/2017 was abnormal due to right > left frontal epileptiform discharges, typical events not captured.  72-hour EEG in 08/2022 which was normal. She reported headaches, facial tingling, with no EEG correlate seen  MRI: MRI brain with and without contrast 10/2019 normal, few small foci of T2 and FLAIR signal in the frontal white matter, a common finding.  PAST MEDICAL HISTORY: Past Medical History:  Diagnosis Date   Anxiety    Depression 2021   Epilepsy (HCC)    Epilepsy (HCC) 04/2013   GBS (group B Streptococcus carrier), +RV culture, currently pregnant 07/01/2019   Seizures (HCC)     MEDICATIONS: Current Outpatient Medications on File Prior to Visit  Medication Sig Dispense Refill   rizatriptan  (MAXALT ) 10 MG tablet Take 1 tablet at onset of migraine. May repeat in 2 hours if needed. Do not take more than 3 a week 10 tablet 6   topiramate  (TOPAMAX ) 100 MG tablet Take 1 tablet in AM, 1 tablet in PM 180 tablet 3   No current facility-administered medications on file prior to visit.    ALLERGIES: No Known Allergies  FAMILY HISTORY: Family History  Problem Relation Age of Onset   Diabetes Father     High blood pressure Father    Kidney disease Father        2 kdney transplants   Hypertension Paternal Grandmother    Diabetes Paternal Grandmother    Breast cancer Paternal Grandmother    Seizures Paternal Grandmother    Arthritis Paternal Grandmother    Asthma Paternal Grandmother    High Cholesterol Paternal Grandmother    Hypertension Paternal Grandfather    Arthritis Paternal Grandfather    Diabetes Paternal Grandfather    High Cholesterol Paternal Grandfather     SOCIAL HISTORY: Social History   Socioeconomic History   Marital status: Single    Spouse name: Not on file   Number of children: 4   Years of education: Not on file   Highest education level: 12th grade  Occupational History   Not on file  Tobacco Use   Smoking status: Former    Types: Cigarettes    Passive exposure: Past   Smokeless tobacco: Never  Vaping Use   Vaping status: Never Used  Substance and Sexual Activity   Alcohol use: No   Drug use: No    Comment:  h/o smoking marijuana    Sexual activity: Yes  Other Topics Concern   Not on file  Social History Narrative   Lives with boyfriend and children in a one story home   Right handed   Caffeine: none   Social Drivers of Corporate investment banker Strain: Low Risk  (12/20/2022)   Overall Financial Resource Strain (CARDIA)    Difficulty of Paying Living Expenses: Not hard at all  Food Insecurity: No Food Insecurity (08/22/2023)   Hunger Vital Sign    Worried About Running Out of Food in the Last Year: Never true    Ran Out of Food in the Last Year: Never true  Transportation Needs: No Transportation Needs (08/22/2023)   PRAPARE - Administrator, Civil Service (Medical): No    Lack of Transportation (Non-Medical): No  Physical Activity: Insufficiently Active (12/20/2022)   Exercise Vital Sign    Days of Exercise per Week: 2 days    Minutes of Exercise per Session: 10 min  Stress: Stress Concern Present (12/20/2022)   [employer] of Occupational Health - Occupational Stress Questionnaire    Feeling of Stress : Very much  Social Connections: Unknown (12/20/2022)   Social Connection and Isolation Panel    Frequency of Communication with Friends and Family: Twice a week    Frequency of Social Gatherings with Friends and Family: Once a week    Attends Religious Services: Patient declined    Database administrator or Organizations: No    Attends Engineer, structural: Not on file    Marital Status: Never married  Intimate Partner Violence: Not At Risk (08/22/2023)   Humiliation, Afraid, Rape, and Kick questionnaire    Fear of Current or Ex-Partner: No    Emotionally Abused: No    Physically Abused: No    Sexually Abused: No     PHYSICAL EXAM: There were no vitals filed for this visit.  General: No acute distress Head:  Normocephalic/atraumatic Skin/Extremities: No rash, no edema Neurological Exam: alert and awake. No aphasia or dysarthria. Fund of knowledge is appropriate. Attention and concentration are normal.   Cranial nerves: Pupils equal, round. Extraocular movements intact with no nystagmus. Visual fields full.  No facial asymmetry.  Motor: Bulk and tone normal, muscle strength 5/5 throughout with no pronator drift.   Finger to nose testing intact.  Gait narrow-based and steady, able to tandem walk adequately.  Romberg negative.   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 Prior prolonged EEG reported right > left frontal epileptiform discharges. MRI brain no acute changes. She has had side effects to multiple medications and continues to have frequent seizures, last was on 4/28 when she had 3 in one day. She was prescribed Clobazam  but has not started it yet, start 10mg  1/2 tab at bedtime, hopefull she can tolerate it as she has been sensitive to medications in the past. We also discussed other options such  as VNS placement, we discussed benefits of VNS, she would like to read more about it. Continue Topiramate  100mg  BID, prn Maxalt . She does not drive. She was given information for the [facility] [place] to help with medication support. Follow-up in 3 months, call for any changes.   Thank you for allowing me to participate in her care.  Please do not hesitate to call for any questions or concerns.    Darice Shivers,  M.D.   CC: Bettey Mackintosh, NP-C

## 2023-09-17 NOTE — Patient Instructions (Signed)
 Good to see you.  Start the Depakote  ER 250mg : Take 1 tablet every night  2. Continue Topiramate  100mg  twice a day  3. Let's start getting the ball rolling for the VNS  4. Follow-up in 3 months, call for any changes   Seizure Precautions: 1. If medication has been prescribed for you to prevent seizures, take it exactly as directed.  Do not stop taking the medicine without talking to your doctor first, even if you have not had a seizure in a long time.   2. Avoid activities in which a seizure would cause danger to yourself or to others.  Don't operate dangerous machinery, swim alone, or climb in high or dangerous places, such as on ladders, roofs, or girders.  Do not drive unless your doctor says you may.  3. If you have any warning that you may have a seizure, lay down in a safe place where you can't hurt yourself.    4.  No driving for 6 months from last seizure, as per [place]  state law.   Please refer to the following link on the [employer]'s website for more information: http://www.epilepsyfoundation.org/answerplace/Social/driving/drivingu.cfm   5.  Maintain good sleep hygiene. Avoid alcohol.  6.  Notify your neurology if you are planning pregnancy or if you become pregnant.  7.  Contact your doctor if you have any problems that may be related to the medicine you are taking.  8.  Call 911 and bring the patient back to the ED if:        A.  The seizure lasts longer than 5 minutes.       B.  The patient doesn't awaken shortly after the seizure  C.  The patient has new problems such as difficulty seeing, speaking or moving  D.  The patient was injured during the seizure  E.  The patient has a temperature over 102 F (39C)  F.  The patient vomited and now is having trouble breathing

## 2023-10-15 ENCOUNTER — Encounter: Payer: Self-pay | Admitting: Neurology

## 2023-10-15 DIAGNOSIS — G43009 Migraine without aura, not intractable, without status migrainosus: Secondary | ICD-10-CM

## 2023-10-15 DIAGNOSIS — G40009 Localization-related (focal) (partial) idiopathic epilepsy and epileptic syndromes with seizures of localized onset, not intractable, without status epilepticus: Secondary | ICD-10-CM

## 2023-10-15 MED ORDER — TOPIRAMATE 100 MG PO TABS: ORAL_TABLET | ORAL | 3 refills | Status: DC

## 2023-10-16 ENCOUNTER — Encounter: Payer: Self-pay | Admitting: Neurology

## 2023-10-28 ENCOUNTER — Encounter: Payer: Self-pay | Admitting: Neurology

## 2023-10-29 ENCOUNTER — Encounter: Payer: Self-pay | Admitting: Neurology

## 2023-10-29 ENCOUNTER — Telehealth (INDEPENDENT_AMBULATORY_CARE_PROVIDER_SITE_OTHER): Admitting: Neurology

## 2023-10-29 VITALS — Ht 66.0 in | Wt 235.0 lb

## 2023-10-29 DIAGNOSIS — G40009 Localization-related (focal) (partial) idiopathic epilepsy and epileptic syndromes with seizures of localized onset, not intractable, without status epilepticus: Secondary | ICD-10-CM | POA: Diagnosis not present

## 2023-10-29 DIAGNOSIS — G43009 Migraine without aura, not intractable, without status migrainosus: Secondary | ICD-10-CM | POA: Diagnosis not present

## 2023-10-29 MED ORDER — DIVALPROEX SODIUM ER 250 MG PO TB24: ORAL_TABLET | ORAL | 6 refills | Status: DC

## 2023-10-29 MED ORDER — SUMATRIPTAN SUCCINATE 50 MG PO TABS: ORAL_TABLET | ORAL | 11 refills | Status: AC

## 2023-10-29 NOTE — Patient Instructions (Signed)
 Good to see you!  Start low dose Depakote  ER 250mg : take 1 tablet every night  2. A prescription was sent for as needed Sumatriptan  50mg : take 1 tablet at onset of migraine. May take second dose after 2 hours if needed. Do not take more than 3 a week  3. Continue Topiramate  100mg : take 1 and 1/2 tablets twice a day  4. I will ask the VNS representative to set you up with a patient ambassador  5. Follow-up in 3 months, call for any changes.   Seizure Precautions: 1. If medication has been prescribed for you to prevent seizures, take it exactly as directed.  Do not stop taking the medicine without talking to your doctor first, even if you have not had a seizure in a long time.   2. Avoid activities in which a seizure would cause danger to yourself or to others.  Don't operate dangerous machinery, swim alone, or climb in high or dangerous places, such as on ladders, roofs, or girders.  Do not drive unless your doctor says you may.  3. If you have any warning that you may have a seizure, lay down in a safe place where you can't hurt yourself.    4.  No driving for 6 months from last seizure, as per [place]  state law.   Please refer to the following link on the [employer]ile[employer]'s website for more information: http://www.epilepsyfoundation.org/answerplace/Social/driving/drivingu.cfm   5.  Maintain good sleep hygiene. Avoid alcohol.  6.  Notify your neurology if you are planning pregnancy or if you become pregnant.  7.  Contact your doctor if you have any problems that may be related to the medicine you are taking.  8.  Call 911 and bring the patient back to the ED if:        A.  The seizure lasts longer than 5 minutes.       B.  The patient doesn't awaken shortly after the seizure  C.  The patient has new problems such as difficulty seeing, speaking or moving  D.  The patient was injured during the seizure  E.  The patient has a temperature over 102 F (39C)  F.  The  patient vomited and now is having trouble breathing

## 2023-10-29 NOTE — Progress Notes (Signed)
 Virtual Visit via Video Note The purpose of this virtual visit is to provide medical care while limiting exposure to the novel coronavirus.    Consent was obtained for video visit:  Yes.   Answered questions that patient had about telehealth interaction:  Yes.     Pt location: Home Physician Location: office Name of referring provider:  Lendia Boby CROME, NP-C I connected with Natalie Lawrence at patients initiation/request on 10/29/2023 at 12:00 PM EDT by video enabled telemedicine application and verified that I am speaking with the correct person using two identifiers. Pt MRN:  [id number] Pt DOB:  01-18-1991 Video Participants:  Natalie Lawrence   History of Present Illness:  The patient had virtual video visit on 10/29/2023. She was last evaluated in the Neurology clinic a month ago for intractable epilepsy. On her last visit, we discussed consideration for VNS and adding low dose Depakote  for seizure and migraine prophylaxis. She did not start the Depakote  due to concern for similar side effects to Clobazam  that affected her mental health. She contacted our office on 8/12 that she was having the seizure-like feeling on a near daily basis. That morning, she felt it then lost consciousness and woke up on the floor with a severe headache. She was instructed to increase the Topiramate  to 150mg  BID. That evening, she had another one lasting 2 minutes and was told it was the longest and more severe than prior ones. She bit both sides and the front of her tongue. She feels like her memory is getting worse, she keeps reminding herself and writes little sticky notes but keeps forgetting. She denies any seizures since 8/12 but continues to have the seizure-like feelings with dizziness and tingling almost daily. Her migraines are also bad, the first day is worst then the pressure loosens up in the next 2-3 days.     History on Initial Assessment 08/28/2019: The patient was seen as a virtual video  visit on 08/28/2019. She has seen several neurologists, records have been reviewed and will be summarized as follows. She is a 33 year old right-handed woman with a history of seizures diagnosed at age 18. She states majority of her seizures are nocturnal. The first seizure occurred in 2015. She is noted to have convulsions with head version to the left. She would feel hot and cold flashes followed by dizziness prior to the convulsions. She has had only 2-3 seizures during wakefulness, last occurred a year ago. Sometimes she gets a warning up to 1 hour prior to the convulsions. She is confused and amnestic of events, sleepy after, with headaches and jaw pain. No focal weakness. She had been seeing Dr. Maree at [facility] from 2018-2019, then Dr. Ines last 05/2019 on the third trimester of her pregnancy. On her visit with Dr. Ines, she was noted to have seizures in February, March, she reports a seizure at the end of May/beginning of June, then her last seizure was 08/18/19. Triggers include heat, stress, when she is overworked. She has occasional episodes where she feels very hot and dizzy like she will fall. She denies any olfactory/gustatory hallucinations, deja vu, rising epigastric sensation, focal numbness/tingling/weakness, myoclonic jerks. She denies any staring/unresponsive episodes or gaps in time. She has tried Levetiracetam , Lamotrigine, carbamazepine, oxcarbazepine, phenytoin. She was last prescribed Levetiracetam  during her visit with Dr. Ines in 05/2019, however she would start and stop it during her pregnancies, she is not taking seizure medication. She states it was the 4th or 5th  medication, the worst I've been on. She does not think it works, she feels spaced out and her depth perception would be different.   She has migraines 4-5 times a month, mostly on the right side with occasional nausea. She does not take any prn medications. She denies any diplopia, dysarthria/dysphagia, neck/back  pain, bowel/bladder dysfunction. She feels her memory started to fade after seizures, she could not remember things from 2 days ago. She has 2 children, a 35 and a half year old and her 44 month old baby. She is breastfeeding and does not want to start any seizure medications at this time. She has no future pregnancy plans, she has had tubal ligation.  Prior AEDs: Levetiracetam , Lamotrigine, oxcarbazepine, Phenytoin, carbamazepine, Zonisamide , Xcopri , Epidiolex , Clobazam  (suicidal ideation) Prior migraine rescue: Maxalt ,   Diagnostic Data: EEGs: Routine EEG in 01/2016 showed diffuse beta activity, otherwise normal.  72-hour EEG in 12/2017 was abnormal due to right > left frontal epileptiform discharges, typical events not captured.  72-hour EEG in 08/2022 which was normal. She reported headaches, facial tingling, with no EEG correlate seen  MRI: MRI brain with and without contrast 10/2019 normal, few small foci of T2 and FLAIR signal in the frontal white matter, a common finding.    Current Outpatient Medications on File Prior to Visit  Medication Sig Dispense Refill   divalproex  (DEPAKOTE  ER) 250 MG 24 hr tablet Take 1 tablet every night 30 tablet 6   topiramate  (TOPAMAX ) 100 MG tablet Take 1 and 1/2 tablets twice a day 270 tablet 3   rizatriptan  (MAXALT ) 10 MG tablet Take 1 tablet at onset of migraine. May repeat in 2 hours if needed. Do not take more than 3 a week (Patient not taking: Reported on 10/29/2023) 10 tablet 6   No current facility-administered medications on file prior to visit.     Observations/Objective:   Vitals:   10/29/23 1117  Weight: 235 lb (106.6 kg)  Height: 5' 6 (1.676 m)   GEN:  The patient appears stated age and is in NAD.  Neurological examination: Patient is awake, alert. No aphasia or dysarthria. Intact fluency and comprehension. Cranial nerves: Extraocular movements intact. No facial asymmetry. Motor: moves all extremities symmetrically, at least  anti-gravity x 4.    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 Prior prolonged EEG reported right > left frontal epileptiform discharges. MRI brain no acute changes. She has had side effects to multiple medications and continues to have frequent seizures, last seizures was 8/12. She is on Topiramate  150mg  BID. She continues to report daily seizure-like feeling and migraines, and states she will start the Depakote  ER 250mg  at bedtime. She is half-half on the fence with the VNS, scared about surgery and having a device in her body, she is agreeable to talking to one of the VNS patient ambassadors. A prescription for prn sumatriptan  50mg  was sent for migraine rescue, side effects discussed. She does not drive. Follow-up in 3 months, call for any changes.    Follow Up Instructions:    -I discussed the assessment and treatment plan with the patient. The patient was provided an opportunity to ask questions and all were answered. The patient agreed with the plan and demonstrated an understanding of the instructions.   The patient was advised to call back or seek an in-person evaluation if the symptoms worsen or if the condition fails to  improve as anticipated.     Darice CHRISTELLA Shivers, MD

## 2024-01-18 ENCOUNTER — Inpatient Hospital Stay (HOSPITAL_COMMUNITY)
Admission: EM | Admit: 2024-01-18 | Discharge: 2024-01-20 | DRG: 399 | Disposition: A | Discharge status: Home / Self Care | Attending: Emergency Medicine | Admitting: Emergency Medicine

## 2024-01-18 ENCOUNTER — Encounter (HOSPITAL_COMMUNITY): Payer: Self-pay | Admitting: Emergency Medicine

## 2024-01-18 ENCOUNTER — Ambulatory Visit (HOSPITAL_COMMUNITY)
Admission: EM | Admit: 2024-01-18 | Discharge: 2024-01-18 | Disposition: A | Discharge status: Home / Self Care | Attending: Family Medicine | Admitting: Family Medicine

## 2024-01-18 ENCOUNTER — Emergency Department (HOSPITAL_COMMUNITY)

## 2024-01-18 ENCOUNTER — Encounter (HOSPITAL_COMMUNITY): Payer: Self-pay

## 2024-01-18 ENCOUNTER — Other Ambulatory Visit: Payer: Self-pay

## 2024-01-18 DIAGNOSIS — Z22330 Carrier of Group B streptococcus: Secondary | ICD-10-CM

## 2024-01-18 DIAGNOSIS — G40909 Epilepsy, unspecified, not intractable, without status epilepticus: Secondary | ICD-10-CM | POA: Diagnosis present

## 2024-01-18 DIAGNOSIS — R112 Nausea with vomiting, unspecified: Secondary | ICD-10-CM | POA: Diagnosis not present

## 2024-01-18 DIAGNOSIS — Z87891 Personal history of nicotine dependence: Secondary | ICD-10-CM

## 2024-01-18 DIAGNOSIS — Z9851 Tubal ligation status: Secondary | ICD-10-CM

## 2024-01-18 DIAGNOSIS — K3532 Acute appendicitis with perforation and localized peritonitis, without abscess: Principal | ICD-10-CM | POA: Diagnosis present

## 2024-01-18 DIAGNOSIS — Z79899 Other long term (current) drug therapy: Secondary | ICD-10-CM

## 2024-01-18 DIAGNOSIS — R1030 Lower abdominal pain, unspecified: Secondary | ICD-10-CM | POA: Diagnosis not present

## 2024-01-18 DIAGNOSIS — R319 Hematuria, unspecified: Secondary | ICD-10-CM | POA: Diagnosis present

## 2024-01-18 DIAGNOSIS — K3580 Unspecified acute appendicitis: Principal | ICD-10-CM | POA: Diagnosis present

## 2024-01-18 DIAGNOSIS — R197 Diarrhea, unspecified: Secondary | ICD-10-CM

## 2024-01-18 DIAGNOSIS — R300 Dysuria: Secondary | ICD-10-CM | POA: Diagnosis present

## 2024-01-18 LAB — COMPREHENSIVE METABOLIC PANEL WITH GFR
ALT: 14 U/L (ref 0–44)
AST: 16 U/L (ref 15–41)
Albumin: 4 g/dL (ref 3.5–5.0)
Alkaline Phosphatase: 91 U/L (ref 38–126)
Anion gap: 11 (ref 5–15)
BUN: 13 mg/dL (ref 6–20)
CO2: 24 mmol/L (ref 22–32)
Calcium: 9.3 mg/dL (ref 8.9–10.3)
Chloride: 99 mmol/L (ref 98–111)
Creatinine, Ser: 0.8 mg/dL (ref 0.44–1.00)
GFR, Estimated: >60 mL/min (ref >60)
Glucose, Bld: 108 mg/dL — ABNORMAL HIGH (ref 70–99)
Potassium: 3.4 mmol/L — ABNORMAL LOW (ref 3.5–5.1)
Sodium: 134 mmol/L — ABNORMAL LOW (ref 135–145)
Total Bilirubin: 0.8 mg/dL (ref 0.0–1.2)
Total Protein: 8.1 g/dL (ref 6.5–8.1)

## 2024-01-18 LAB — CBC
HCT: 40 % (ref 36.0–46.0)
Hemoglobin: 12.9 g/dL (ref 12.0–15.0)
MCH: 27.8 pg (ref 26.0–34.0)
MCHC: 32.3 g/dL (ref 30.0–36.0)
MCV: 86.2 fL (ref 80.0–100.0)
Platelets: 294 K/uL (ref 150–400)
RBC: 4.64 MIL/uL (ref 3.87–5.11)
RDW: 13 % (ref 11.5–15.5)
WBC: 20.6 K/uL — ABNORMAL HIGH (ref 4.0–10.5)
nRBC: 0 % (ref 0.0–0.2)

## 2024-01-18 LAB — URINALYSIS, ROUTINE W REFLEX MICROSCOPIC
Bilirubin Urine: NEGATIVE
Glucose, UA: NEGATIVE mg/dL
Ketones, ur: 20 mg/dL — AB
Nitrite: NEGATIVE
Protein, ur: 30 mg/dL — AB
Specific Gravity, Urine: 1.027 (ref 1.005–1.030)
pH: 5 (ref 5.0–8.0)

## 2024-01-18 LAB — MRSA NEXT GEN BY PCR, NASAL: MRSA by PCR Next Gen: NOT DETECTED

## 2024-01-18 LAB — HCG, SERUM, QUALITATIVE: Preg, Serum: NEGATIVE

## 2024-01-18 LAB — LIPASE, BLOOD: Lipase: 43 U/L (ref 11–51)

## 2024-01-18 MED ORDER — DIVALPROEX SODIUM ER 250 MG PO TB24: 250.0000 mg | ORAL_TABLET | Freq: Every day | ORAL | Status: DC

## 2024-01-18 MED ORDER — SODIUM CHLORIDE 0.9 % IV SOLN
2.0000 g | INTRAVENOUS | Status: DC
  Administered 2024-01-18 – 2024-01-19 (×2): 2 g via INTRAVENOUS
  Filled 2024-01-18 (×2): qty 20

## 2024-01-18 MED ORDER — MORPHINE SULFATE (PF) 4 MG/ML IV SOLN: 4.0000 mg | INTRAVENOUS | Status: DC | PRN

## 2024-01-18 MED ORDER — SODIUM CHLORIDE 0.9 % IV BOLUS
1000.0000 mL | Freq: Once | INTRAVENOUS | Status: AC
  Administered 2024-01-18: 1000 mL via INTRAVENOUS

## 2024-01-18 MED ORDER — IOHEXOL 300 MG/ML  SOLN
100.0000 mL | Freq: Once | INTRAMUSCULAR | Status: AC | PRN
  Administered 2024-01-18: 100 mL via INTRAVENOUS

## 2024-01-18 MED ORDER — SODIUM CHLORIDE 0.9 % IV SOLN: INTRAVENOUS | Status: AC

## 2024-01-18 MED ORDER — DIPHENHYDRAMINE HCL 25 MG PO CAPS
25.0000 mg | ORAL_CAPSULE | Freq: Four times a day (QID) | ORAL | Status: DC | PRN
Start: 2024-01-18 — End: 2024-01-20

## 2024-01-18 MED ORDER — DIPHENHYDRAMINE HCL 50 MG/ML IJ SOLN: 25.0000 mg | Freq: Four times a day (QID) | INTRAMUSCULAR | Status: DC | PRN

## 2024-01-18 MED ORDER — METRONIDAZOLE 500 MG/100ML IV SOLN
500.0000 mg | Freq: Two times a day (BID) | INTRAVENOUS | Status: DC
  Administered 2024-01-18 – 2024-01-20 (×4): 500 mg via INTRAVENOUS
  Filled 2024-01-18 (×4): qty 100

## 2024-01-18 MED ORDER — ONDANSETRON 4 MG PO TBDP: 4.0000 mg | ORAL_TABLET | Freq: Four times a day (QID) | ORAL | Status: DC | PRN

## 2024-01-18 MED ORDER — TOPIRAMATE 25 MG PO TABS
150.0000 mg | ORAL_TABLET | Freq: Once | ORAL | Status: AC
  Administered 2024-01-18: 150 mg via ORAL
  Filled 2024-01-18: qty 2

## 2024-01-18 MED ORDER — ONDANSETRON HCL 4 MG/2ML IJ SOLN: 4.0000 mg | Freq: Four times a day (QID) | INTRAMUSCULAR | Status: DC | PRN

## 2024-01-18 MED ORDER — ACETAMINOPHEN 325 MG PO TABS
650.0000 mg | ORAL_TABLET | Freq: Four times a day (QID) | ORAL | Status: DC | PRN
  Administered 2024-01-18: 650 mg via ORAL
  Filled 2024-01-18: qty 2

## 2024-01-18 MED ORDER — MORPHINE SULFATE (PF) 4 MG/ML IV SOLN
4.0000 mg | Freq: Once | INTRAVENOUS | Status: AC
  Administered 2024-01-18: 4 mg via INTRAVENOUS
  Filled 2024-01-18: qty 1

## 2024-01-18 MED ORDER — TOPIRAMATE 25 MG PO TABS
150.0000 mg | ORAL_TABLET | Freq: Two times a day (BID) | ORAL | Status: DC
  Administered 2024-01-19 – 2024-01-20 (×3): 150 mg via ORAL
  Filled 2024-01-18 (×3): qty 2

## 2024-01-18 MED ORDER — ONDANSETRON HCL 4 MG/2ML IJ SOLN
4.0000 mg | Freq: Once | INTRAMUSCULAR | Status: AC
  Administered 2024-01-18: 4 mg via INTRAVENOUS
  Filled 2024-01-18: qty 2

## 2024-01-18 MED ORDER — MELATONIN 3 MG PO TABS: 3.0000 mg | ORAL_TABLET | Freq: Every evening | ORAL | Status: DC | PRN

## 2024-01-18 MED ORDER — ACETAMINOPHEN 650 MG RE SUPP: 650.0000 mg | Freq: Four times a day (QID) | RECTAL | Status: DC | PRN

## 2024-01-18 NOTE — ED Provider Notes (Signed)
 [place] EMERGENCY DEPARTMENT AT [facility] Provider Note   CSN: [id number] Arrival date & time: 01/18/24  1456     Patient presents with: Abdominal Pain   Natalie Lawrence is a 33 y.o. female.  With a history of seizure disorder and status post tubal ligation who presents to the ED for abdominal pain.  2 days of right lower abdominal pain with associated nausea vomiting.  Several episodes of nonbloody diarrhea as well.  Unable to tolerate p.o. at home.  Did have a seizure 2 days ago and has not been able to keep her Topamax  down in the last 2 days.  No seizures today.  Seen earlier in urgent care and directed here for further evaluation.  Last menstrual period ended on 1030.  Does note some dysuria with increased pelvic pressure and hematuria.  No fevers chills chest pain shortness of breath.    Abdominal Pain      Prior to Admission medications   Medication Sig Start Date End Date Taking? Authorizing Provider  topiramate  (TOPAMAX ) 100 MG tablet Take 1 and 1/2 tablets twice a day 10/15/23  Yes Georjean Darice HERO, MD  divalproex  (DEPAKOTE  ER) 250 MG 24 hr tablet Take 1 tablet every night Patient not taking: Reported on 01/18/2024 10/29/23   Georjean Darice HERO, MD  SUMAtriptan  (IMITREX ) 50 MG tablet Take 1 tablet at onset of migraine. May repeat in 2 hours if headache persists or recurs. Do not take more than 3 a week. Patient not taking: Reported on 01/18/2024 10/29/23   Georjean Darice HERO, MD    Allergies: Patient has no known allergies.    Review of Systems  Gastrointestinal:  Positive for abdominal pain.    Updated Vital Signs BP 114/73 (BP Location: Left Arm)   Pulse 94   Temp 98.8 F (37.1 C) (Oral)   Resp 14   Ht 5' 6 (1.676 m)   Wt 104.3 kg   LMP 01/02/2024   SpO2 100%   Breastfeeding No   BMI 37.12 kg/m   Physical Exam Vitals and nursing note reviewed.  HENT:     Head: Normocephalic and atraumatic.  Eyes:     Pupils: Pupils are equal, round, and  reactive to light.  Cardiovascular:     Rate and Rhythm: Normal rate and regular rhythm.  Pulmonary:     Effort: Pulmonary effort is normal.     Breath sounds: Normal breath sounds.  Abdominal:     Palpations: Abdomen is soft.     Tenderness: There is abdominal tenderness in the right lower quadrant.  Skin:    General: Skin is warm and dry.  Neurological:     Mental Status: She is alert.  Psychiatric:        Mood and Affect: Mood normal.     (all labs ordered are listed, but only abnormal results are displayed) Labs Reviewed  COMPREHENSIVE METABOLIC PANEL WITH GFR - Abnormal; Notable for the following components:      Result Value   Sodium 134 (*)    Potassium 3.4 (*)    Glucose, Bld 108 (*)    All other components within normal limits  CBC - Abnormal; Notable for the following components:   WBC 20.6 (*)    All other components within normal limits  URINALYSIS, ROUTINE W REFLEX MICROSCOPIC - Abnormal; Notable for the following components:   Color, Urine AMBER (*)    APPearance HAZY (*)    Hgb urine dipstick LARGE (*)  Ketones, ur 20 (*)    Protein, ur 30 (*)    Leukocytes,Ua TRACE (*)    Bacteria, UA RARE (*)    All other components within normal limits  MRSA NEXT GEN BY PCR, NASAL  LIPASE, BLOOD  HCG, SERUM, QUALITATIVE  HIV ANTIBODY (ROUTINE TESTING W REFLEX)    EKG: None  Radiology: CT ABDOMEN PELVIS W CONTRAST Result Date: 01/18/2024 EXAM: CT ABDOMEN AND PELVIS WITH CONTRAST 01/18/2024 07:42:35 PM TECHNIQUE: CT of the abdomen and pelvis was performed with the administration of 100 mL of iohexol (OMNIPAQUE) 300 MG/ML solution. Multiplanar reformatted images are provided for review. Automated exposure control, iterative reconstruction, and/or weight-based adjustment of the mA/kV was utilized to reduce the radiation dose to as low as reasonably achievable. COMPARISON: None available. CLINICAL HISTORY: RLQ abdominal pain FINDINGS: LOWER CHEST: No acute abnormality.  LIVER: The liver is unremarkable. GALLBLADDER AND BILE DUCTS: Gallbladder is unremarkable. No biliary ductal dilatation. SPLEEN: No acute abnormality. PANCREAS: No acute abnormality. ADRENAL GLANDS: No acute abnormality. KIDNEYS, URETERS AND BLADDER: No stones in the kidneys or ureters. No hydronephrosis. No perinephric or periureteral stranding. Urinary bladder is unremarkable. GI AND BOWEL: Stomach demonstrates no acute abnormality. Fluid-filled appendix with caliber enlargement measuring up to 17 mm. Associated periappendiceal fat stranding and appendicolith within the appendiceal lumen base. No appendiceal wall discontinuity. No small or large bowel thickening or dilatation. Slight parasigmoid fat stranding appears to be reactive in etiology with adjacent appendix. Few scattered transverse colon diverticula. There is no bowel obstruction. PERITONEUM AND RETROPERITONEUM: No ascites. No free air. VASCULATURE: Aorta is normal in caliber. LYMPH NODES: No lymphadenopathy. REPRODUCTIVE ORGANS: Uterus is unremarkable. No adnexal mass. BONES AND SOFT TISSUES: Tiny fat containing umbilical hernia. No acute osseous abnormality. No focal soft tissue abnormality. IMPRESSION: 1. Acute appendicitis; no evidence of perforation. Electronically signed by: Morgane Naveau MD 01/18/2024 07:50 PM EST RP Workstation: [id number]     Procedures   Medications Ordered in the ED  topiramate  (TOPAMAX ) tablet 150 mg (has no administration in time range)  0.9 %  sodium chloride  infusion ( Intravenous New Bag/Given 01/18/24 2147)  cefTRIAXone (ROCEPHIN) 2 g in sodium chloride  0.9 % 100 mL IVPB (2 g Intravenous New Bag/Given 01/18/24 2149)    And  metroNIDAZOLE (FLAGYL) IVPB 500 mg (500 mg Intravenous New Bag/Given 01/18/24 2217)  acetaminophen  (TYLENOL ) tablet 650 mg (has no administration in time range)    Or  acetaminophen  (TYLENOL ) suppository 650 mg (has no administration in time range)  morphine (PF) 4 MG/ML injection 4 mg  (has no administration in time range)  melatonin tablet 3 mg (has no administration in time range)  diphenhydrAMINE  (BENADRYL ) capsule 25 mg (has no administration in time range)    Or  diphenhydrAMINE  (BENADRYL ) injection 25 mg (has no administration in time range)  ondansetron  (ZOFRAN -ODT) disintegrating tablet 4 mg (has no administration in time range)    Or  ondansetron  (ZOFRAN ) injection 4 mg (has no administration in time range)  sodium chloride  0.9 % bolus 1,000 mL (1,000 mLs Intravenous New Bag/Given 01/18/24 1806)  ondansetron  (ZOFRAN ) injection 4 mg (4 mg Intravenous Given 01/18/24 1804)  topiramate  (TOPAMAX ) tablet 150 mg (150 mg Oral Given 01/18/24 1826)  morphine (PF) 4 MG/ML injection 4 mg (4 mg Intravenous Given 01/18/24 1905)  iohexol (OMNIPAQUE) 300 MG/ML solution 100 mL (100 mLs Intravenous Contrast Given 01/18/24 1941)    Clinical Course as of 01/18/24 2224  Sat Jan 18, 2024  2020 CT shows acute appendicitis without  abscess or perforation.  Discussed with Dr.Tsuei (general surgery) who will admit patient to surgical service with plan for appendectomy tomorrow. [MP]    Clinical Course User Index [MP] Pamella Ozell LABOR, DO                                 Medical Decision Making  33 year old female with history as above presenting for right lower quadrant abdominal pain nausea vomiting diarrhea for the last 2 days.  Seizure history on Topamax .  Seizure yesterday but none today.  Difficulty tolerating meds and other p.o. intake at home.  Exam notable for right lower quadrant tenderness.  Afebrile slightly tachycardic dry heaving on my exam.   Differential diagnosis includes: Acute intra-abdominal infectious/inflammatory process such as appendicitis, diverticulitis, pancreatitis and cholecystitis Urinary tract infection Ovarian cyst Atypical presentation for pneumonia Viral gastroenteritis  Will obtain laboratory workup including CBC with differential, metabolic panel,  lipase and urinalysis along with CT abdomen pelvis Will provide IV fluids Zofran  for nausea relief  Amount and/or Complexity of Data Reviewed Labs: ordered. Radiology: ordered.  Risk Prescription drug management. Decision regarding hospitalization.        Final diagnoses:  Acute appendicitis, unspecified acute appendicitis type    ED Discharge Orders     None          Pamella Ozell LABOR, DO 01/18/24 2225

## 2024-01-18 NOTE — ED Triage Notes (Signed)
 Abdominal pain 2 days ago.  Vomiting started 2 days ago.  Reports 2 episodes today.  Patient is unable to hold any medications down.  Has not been able to take seizure medicine in 2 days.  Feels pressure in lower abdomen with urination and notes brownish tint to toilet paper   Patient has not had any medications  Reports having a seizure yesterday and now has a migraine (typical pattern for patient's seizures)  No one else is sick at home

## 2024-01-18 NOTE — ED Triage Notes (Signed)
 Pt reports upper abdominal pain that radiates into her pelvic area, onset two days ago after eating food from [employer]. She reports she hasn't actually vomited today, its more just dry heaving. He reports she is unable to take her seizure meds because she can't keep anything down. Last took Topamax  2 days ago. Self reported last seizure was yesterday.

## 2024-01-18 NOTE — ED Notes (Signed)
 Patient is being discharged from the Urgent Care and sent to the Emergency Department via pov . Per Dr Vonna, patient is in need of higher level of care due to significant abdominal pain, seizure history. Patient is aware and verbalizes understanding of plan of care.  Vitals:   01/18/24 1413  BP: 113/87  Pulse: (!) 104  Resp: 18  Temp: 99 F (37.2 C)  SpO2: 100%

## 2024-01-18 NOTE — ED Notes (Signed)
 Spoke to dr vonna about patient and complaint.  Patient to be moved to a treatment room-placed in room 10

## 2024-01-18 NOTE — Discharge Instructions (Signed)
She will go to the ER.
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
## 2024-01-18 NOTE — ED Notes (Signed)
 Pt attempting to provide urine sample

## 2024-01-19 ENCOUNTER — Observation Stay (HOSPITAL_COMMUNITY): Admitting: Anesthesiology

## 2024-01-19 ENCOUNTER — Encounter (HOSPITAL_COMMUNITY): Admission: EM | Disposition: A | Discharge status: Home / Self Care | Payer: Self-pay | Source: Home / Self Care

## 2024-01-19 DIAGNOSIS — R300 Dysuria: Secondary | ICD-10-CM | POA: Diagnosis present

## 2024-01-19 DIAGNOSIS — K35891 Other acute appendicitis without perforation, with gangrene: Secondary | ICD-10-CM

## 2024-01-19 DIAGNOSIS — Z79899 Other long term (current) drug therapy: Secondary | ICD-10-CM | POA: Diagnosis not present

## 2024-01-19 DIAGNOSIS — Z9851 Tubal ligation status: Secondary | ICD-10-CM | POA: Diagnosis not present

## 2024-01-19 DIAGNOSIS — Z22330 Carrier of Group B streptococcus: Secondary | ICD-10-CM | POA: Diagnosis not present

## 2024-01-19 DIAGNOSIS — K3532 Acute appendicitis with perforation and localized peritonitis, without abscess: Secondary | ICD-10-CM | POA: Diagnosis present

## 2024-01-19 DIAGNOSIS — R319 Hematuria, unspecified: Secondary | ICD-10-CM | POA: Diagnosis present

## 2024-01-19 DIAGNOSIS — Z87891 Personal history of nicotine dependence: Secondary | ICD-10-CM | POA: Diagnosis not present

## 2024-01-19 DIAGNOSIS — G40909 Epilepsy, unspecified, not intractable, without status epilepticus: Secondary | ICD-10-CM | POA: Diagnosis present

## 2024-01-19 HISTORY — PX: LAPAROSCOPIC APPENDECTOMY: SHX408

## 2024-01-19 LAB — CBC
HCT: 37.3 % (ref 36.0–46.0)
Hemoglobin: 11.7 g/dL — ABNORMAL LOW (ref 12.0–15.0)
MCH: 27.5 pg (ref 26.0–34.0)
MCHC: 31.4 g/dL (ref 30.0–36.0)
MCV: 87.8 fL (ref 80.0–100.0)
Platelets: 238 K/uL (ref 150–400)
RBC: 4.25 MIL/uL (ref 3.87–5.11)
RDW: 13.1 % (ref 11.5–15.5)
WBC: 16.4 K/uL — ABNORMAL HIGH (ref 4.0–10.5)
nRBC: 0 % (ref 0.0–0.2)

## 2024-01-19 LAB — CREATININE, SERUM
Creatinine, Ser: 0.79 mg/dL (ref 0.44–1.00)
GFR, Estimated: >60 mL/min (ref >60)

## 2024-01-19 SURGERY — APPENDECTOMY, LAPAROSCOPIC
Anesthesia: General | Site: Abdomen

## 2024-01-19 MED ORDER — SCOPOLAMINE 1 MG/3DAYS TD PT72: 1.0000 | MEDICATED_PATCH | TRANSDERMAL | Status: DC

## 2024-01-19 MED ORDER — BUPIVACAINE-EPINEPHRINE (PF) 0.25% -1:200000 IJ SOLN
INTRAMUSCULAR | Status: DC | PRN
  Administered 2024-01-19: 15 mL

## 2024-01-19 MED ORDER — SUCCINYLCHOLINE CHLORIDE 200 MG/10ML IV SOSY
PREFILLED_SYRINGE | INTRAVENOUS | Status: AC
  Filled 2024-01-19: qty 10

## 2024-01-19 MED ORDER — LACTATED RINGERS IR SOLN
Status: DC | PRN
  Administered 2024-01-19: 1000 mL

## 2024-01-19 MED ORDER — ONDANSETRON HCL 4 MG/2ML IJ SOLN
INTRAMUSCULAR | Status: AC
  Filled 2024-01-19: qty 2

## 2024-01-19 MED ORDER — LIDOCAINE HCL (PF) 2 % IJ SOLN
INTRAMUSCULAR | Status: AC
  Filled 2024-01-19: qty 5

## 2024-01-19 MED ORDER — BUPIVACAINE-EPINEPHRINE (PF) 0.25% -1:200000 IJ SOLN
INTRAMUSCULAR | Status: AC
  Filled 2024-01-19: qty 30

## 2024-01-19 MED ORDER — LACTATED RINGERS IV SOLN: INTRAVENOUS | Status: DC | PRN

## 2024-01-19 MED ORDER — ENOXAPARIN SODIUM 60 MG/0.6ML IJ SOSY
50.0000 mg | PREFILLED_SYRINGE | INTRAMUSCULAR | Status: DC
  Administered 2024-01-20: 50 mg via SUBCUTANEOUS
  Filled 2024-01-19: qty 0.6

## 2024-01-19 MED ORDER — FENTANYL CITRATE (PF) 250 MCG/5ML IJ SOLN
INTRAMUSCULAR | Status: DC | PRN
  Administered 2024-01-19: 100 ug via INTRAVENOUS

## 2024-01-19 MED ORDER — SUGAMMADEX SODIUM 200 MG/2ML IV SOLN
INTRAVENOUS | Status: AC
  Filled 2024-01-19: qty 2

## 2024-01-19 MED ORDER — KETOROLAC TROMETHAMINE 30 MG/ML IJ SOLN
INTRAMUSCULAR | Status: AC
  Filled 2024-01-19: qty 1

## 2024-01-19 MED ORDER — MIDAZOLAM HCL (PF) 2 MG/2ML IJ SOLN
INTRAMUSCULAR | Status: DC | PRN
Start: 2024-01-19 — End: 2024-01-19
  Administered 2024-01-19: 2 mg via INTRAVENOUS
  Administered 2024-01-19: 1 mg via INTRAVENOUS

## 2024-01-19 MED ORDER — MIDAZOLAM HCL 2 MG/2ML IJ SOLN
INTRAMUSCULAR | Status: AC
  Filled 2024-01-19: qty 2

## 2024-01-19 MED ORDER — HYDROMORPHONE HCL 2 MG/ML IJ SOLN
INTRAMUSCULAR | Status: AC
  Filled 2024-01-19: qty 1

## 2024-01-19 MED ORDER — ONDANSETRON HCL 4 MG/2ML IJ SOLN
INTRAMUSCULAR | Status: DC | PRN
  Administered 2024-01-19: 4 mg via INTRAVENOUS

## 2024-01-19 MED ORDER — OXYCODONE HCL 5 MG PO TABS
5.0000 mg | ORAL_TABLET | ORAL | Status: DC | PRN
  Administered 2024-01-20: 5 mg via ORAL
  Filled 2024-01-19: qty 1

## 2024-01-19 MED ORDER — SUCCINYLCHOLINE CHLORIDE 200 MG/10ML IV SOSY
PREFILLED_SYRINGE | INTRAVENOUS | Status: DC | PRN
Start: 2024-01-19 — End: 2024-01-19
  Administered 2024-01-19: 200 mg via INTRAVENOUS

## 2024-01-19 MED ORDER — PROPOFOL 10 MG/ML IV BOLUS
INTRAVENOUS | Status: AC
  Filled 2024-01-19: qty 20

## 2024-01-19 MED ORDER — HYDROMORPHONE HCL 1 MG/ML IJ SOLN
INTRAMUSCULAR | Status: DC | PRN
  Administered 2024-01-19 (×2): 1 mg via INTRAVENOUS

## 2024-01-19 MED ORDER — LIDOCAINE HCL (CARDIAC) PF 100 MG/5ML IV SOSY
PREFILLED_SYRINGE | INTRAVENOUS | Status: DC | PRN
Start: 2024-01-19 — End: 2024-01-19
  Administered 2024-01-19: 20 mg via INTRAVENOUS

## 2024-01-19 MED ORDER — SUGAMMADEX SODIUM 200 MG/2ML IV SOLN
INTRAVENOUS | Status: DC | PRN
  Administered 2024-01-19: 200 mg via INTRAVENOUS

## 2024-01-19 MED ORDER — ROCURONIUM BROMIDE 10 MG/ML (PF) SYRINGE
PREFILLED_SYRINGE | INTRAVENOUS | Status: DC | PRN
Start: 2024-01-19 — End: 2024-01-19
  Administered 2024-01-19: 40 mg via INTRAVENOUS

## 2024-01-19 MED ORDER — PROPOFOL 10 MG/ML IV BOLUS
INTRAVENOUS | Status: DC | PRN
Start: 2024-01-19 — End: 2024-01-19
  Administered 2024-01-19: 200 mg via INTRAVENOUS

## 2024-01-19 MED ORDER — FENTANYL CITRATE (PF) 100 MCG/2ML IJ SOLN
INTRAMUSCULAR | Status: AC
  Filled 2024-01-19: qty 2

## 2024-01-19 MED ORDER — ACETAMINOPHEN 10 MG/ML IV SOLN
INTRAVENOUS | Status: DC | PRN
  Administered 2024-01-19: 1000 mg via INTRAVENOUS

## 2024-01-19 MED ORDER — METHOCARBAMOL 500 MG PO TABS
500.0000 mg | ORAL_TABLET | Freq: Four times a day (QID) | ORAL | Status: DC | PRN
Start: 2024-01-19 — End: 2024-01-20

## 2024-01-19 MED ORDER — DEXAMETHASONE SOD PHOSPHATE PF 10 MG/ML IJ SOLN
INTRAMUSCULAR | Status: DC | PRN
  Administered 2024-01-19: 10 mg via INTRAVENOUS

## 2024-01-19 MED ORDER — ACETAMINOPHEN 10 MG/ML IV SOLN
INTRAVENOUS | Status: AC
  Filled 2024-01-19: qty 100

## 2024-01-19 MED ORDER — KETOROLAC TROMETHAMINE 30 MG/ML IJ SOLN
INTRAMUSCULAR | Status: DC | PRN
  Administered 2024-01-19: 30 mg via INTRAVENOUS

## 2024-01-19 MED ORDER — ROCURONIUM BROMIDE 10 MG/ML (PF) SYRINGE
PREFILLED_SYRINGE | INTRAVENOUS | Status: AC
  Filled 2024-01-19: qty 10

## 2024-01-19 SURGICAL SUPPLY — 37 items
BAG COUNTER SPONGE SURGICOUNT (BAG) IMPLANT
BENZOIN TINCTURE PRP APPL 2/3 (GAUZE/BANDAGES/DRESSINGS) IMPLANT
CLIP APPLIE 5 13 M/L LIGAMAX5 (MISCELLANEOUS) IMPLANT
CLIP APPLIE ROT 10 11.4 M/L (STAPLE) IMPLANT
CLSR STERI-STRIP ANTIMIC 1/2X4 (GAUZE/BANDAGES/DRESSINGS) IMPLANT
COVER SURGICAL LIGHT HANDLE (MISCELLANEOUS) ×1 IMPLANT
CUTTER ECHEON FLEX ENDO 45 340 (ENDOMECHANICALS) IMPLANT
DRSG TEGADERM 2-3/8X2-3/4 SM (GAUZE/BANDAGES/DRESSINGS) IMPLANT
ELECT REM PT RETURN 15FT ADLT (MISCELLANEOUS) ×1 IMPLANT
GAUZE SPONGE 2X2 8PLY STRL LF (GAUZE/BANDAGES/DRESSINGS) IMPLANT
GLOVE BIO SURGEON STRL SZ7 (GLOVE) ×1 IMPLANT
GLOVE BIOGEL PI IND STRL 7.0 (GLOVE) ×1 IMPLANT
GLOVE BIOGEL PI IND STRL 7.5 (GLOVE) ×1 IMPLANT
GOWN STRL REUS W/ TWL LRG LVL3 (GOWN DISPOSABLE) ×2 IMPLANT
IRRIGATION SUCT STRKRFLW 2 WTP (MISCELLANEOUS) ×1 IMPLANT
KIT BASIN OR (CUSTOM PROCEDURE TRAY) ×1 IMPLANT
KIT TURNOVER KIT A (KITS) ×1 IMPLANT
NS IRRIG 1000ML POUR BTL (IV SOLUTION) ×1 IMPLANT
PENCIL SMOKE EVACUATOR (MISCELLANEOUS) IMPLANT
RELOAD STAPLE 45 2.6 WHT THIN (STAPLE) IMPLANT
RELOAD STAPLE 45 3.6 BLU REG (STAPLE) IMPLANT
SCISSORS LAP 5X35 DISP (ENDOMECHANICALS) ×1 IMPLANT
SET TUBE SMOKE EVAC HIGH FLOW (TUBING) ×1 IMPLANT
SHEARS HARMONIC 36 ACE (MISCELLANEOUS) IMPLANT
SLEEVE ADV FIXATION 5X100MM (TROCAR) IMPLANT
SLEEVE Z-THREAD 5X100MM (TROCAR) ×1 IMPLANT
SPIKE FLUID TRANSFER (MISCELLANEOUS) ×1 IMPLANT
STRIP CLOSURE SKIN 1/2X4 (GAUZE/BANDAGES/DRESSINGS) ×1 IMPLANT
SUT MNCRL AB 4-0 PS2 18 (SUTURE) ×1 IMPLANT
SUT VICRYL 0 ENDOLOOP (SUTURE) IMPLANT
SUT VICRYL 0 UR6 27IN ABS (SUTURE) ×1 IMPLANT
SYSTEM BAG RETRIEVAL 10MM (BASKET) ×1 IMPLANT
TOWEL OR DSP ST BLU DLX 10/PK (DISPOSABLE) ×1 IMPLANT
TRAY FOLEY MTR SLVR 16FR STAT (SET/KITS/TRAYS/PACK) IMPLANT
TRAY LAPAROSCOPIC (CUSTOM PROCEDURE TRAY) ×1 IMPLANT
TROCAR BALLN 12MMX100 BLUNT (TROCAR) ×1 IMPLANT
TROCAR Z-THREAD OPTICAL 5X100M (TROCAR) ×1 IMPLANT

## 2024-01-19 NOTE — Anesthesia Procedure Notes (Signed)
 Procedure Name: Intubation Date/Time: 01/19/2024 7:39 AM  Performed by: Dasie Nena PARAS, CRNAPre-anesthesia Checklist: Patient identified, Emergency Drugs available, Suction available, Patient being monitored and Timeout performed Patient Re-evaluated:Patient Re-evaluated prior to induction Oxygen Delivery Method: Circle system utilized Preoxygenation: Pre-oxygenation with 100% oxygen Induction Type: IV induction, Rapid sequence and Cricoid Pressure applied Laryngoscope Size: Miller and 3 Grade View: Grade I Tube type: Oral Tube size: 7.0 mm Number of attempts: 1 Airway Equipment and Method: Stylet Placement Confirmation: ETT inserted through vocal cords under direct vision, positive ETCO2 and breath sounds checked- equal and bilateral Secured at: 23 cm Tube secured with: Tape Dental Injury: Teeth and Oropharynx as per pre-operative assessment

## 2024-01-19 NOTE — Plan of Care (Signed)
  Problem: Activity: Goal: Risk for activity intolerance will decrease Outcome: Progressing   Problem: Nutrition: Goal: Adequate nutrition will be maintained Outcome: Progressing   Problem: Elimination: Goal: Will not experience complications related to urinary retention Outcome: Progressing   

## 2024-01-19 NOTE — Op Note (Signed)
 Appendectomy, Laparoscopic, Procedure Note  Indications: This is a 33 year old female with a past medical history of seizure disorder and abdominal surgery of tubal ligation who presents with 2 days of lower abdominal pain with associated nausea and vomiting. Several episodes of diarrhea. Her last seizure was 2 days ago. She denies any fever. She presented to the emergency department for evaluation and was found to have appendicitis with no sign of perforation.   Pre-operative Diagnosis: Acute appendicitis without mention of peritonitis  Post-operative Diagnosis: Gangrenous appendicitis  Surgeon: Donnice MARLA Lima   Assistants: none  Anesthesia: General endotracheal anesthesia  ASA Class: 2  Procedure Details  The patient was seen again in the Holding Room. The risks, benefits, complications, treatment options, and expected outcomes were discussed with the patient and/or family. The possibilities of reaction to medication, perforation of viscus, bleeding, recurrent infection, finding a normal appendix, the need for additional procedures, failure to diagnose a condition, and creating a complication requiring transfusion or operation were discussed. There was concurrence with the proposed plan and informed consent was obtained. The site of surgery was properly noted. The patient was taken to Operating Room, identified as Natalie Lawrence and the procedure verified as Appendectomy. A Time Out was held and the above information confirmed.  The patient was placed in the supine position and general anesthesia was induced.  The abdomen was prepped and draped in a sterile fashion. A one centimeter infraumbilical incision was made using her old scar.  Dissection was carried down to the fascia bluntly.  The fascia was incised vertically.  She has a very small umbilical hernia defect that was included in our fascial opening.  We entered the peritoneal cavity bluntly.  A pursestring suture was passed around  the fascial opening with a 0 Vicryl.  The Hasson cannula was introduced into the abdomen and the tails of the suture were used to hold the Hasson in place.   The pneumoperitoneum was then established maintaining a maximum pressure of 15 mmHg.  Additional 5 mm cannulas then placed in the left lower quadrant of the abdomen and the epigastrium under direct visualization. A careful evaluation of the entire abdomen was carried out. The patient was placed in Trendelenburg and left lateral decubitus position.  The scope was moved to the right upper quadrant port site. The cecum was mobilized medially.  The appendix has some adjacent small bowel that was adherent to its anterior surface.  We bluntly dissected the bowel away from the appendix.  The appendix is very inflamed and appears gangrenous.  There is no sign of perforation.  The tip of the appendix is adherent down in the right pelvis.  We were able to bluntly dissect the tip of the appendix out of the pelvis.  It appears gangrenous, but there is no abscess or obvious perforation.  The appendix was carefully dissected. The appendix was skeletonized with the harmonic scalpel.   The base of the appendix appears normal.  The appendix was divided at its base using an endo-GIA stapler. No stump was left in place. We placed the appendix in a retrieval bag, which was removed through the umbilical port site.  There was no evidence of bleeding, leakage, or complication after division of the appendix. Irrigation was also performed and irrigate suctioned from the abdomen as well.  The umbilical port site was closed with the purse string suture. There was no residual palpable fascial defect.  The trocar site skin wounds were closed with 4-0 Monocryl.  Instrument, sponge, and needle counts were correct at the conclusion of the case.   Findings: The appendix was found to be inflamed. There were signs of necrosis.  There was not perforation. There was not abscess  formation.  Estimated Blood Loss:  Minimal         Drains: none         Specimens: appendix         Complications:  None; patient tolerated the procedure well.         Disposition: PACU - hemodynamically stable.         Condition: stable  Donnice POUR. Belinda, MD, [facility]  General Surgery   01/19/2024 8:35 AM

## 2024-01-19 NOTE — H&P (Signed)
 Natalie Lawrence is an 33 y.o. female.   Chief Complaint: Abdominal pain HPI: This is a 33 year old female with a past medical history of seizure disorder and abdominal surgery of tubal ligation who presents with 2 days of lower abdominal pain with associated nausea and vomiting.  Several episodes of diarrhea.  Her last seizure was 2 days ago.  She denies any fever.  She presented to the emergency department for evaluation and was found to have appendicitis with no sign of perforation.  Past Medical History:  Diagnosis Date   Anxiety    Depression 2021   Epilepsy [facility])    Epilepsy (HCC) 04/2013   GBS (group B Streptococcus carrier), +RV culture, currently pregnant 07/01/2019   Seizures (HCC)     Past Surgical History:  Procedure Laterality Date   CESAREAN SECTION  2012   TUBAL LIGATION Bilateral 07/03/2019   Procedure: POST PARTUM TUBAL LIGATION;  Surgeon: Lorence Ozell CROME, MD;  Location: [facility] LD ORS;  Service: Gynecology;  Laterality: Bilateral;    Family History  Problem Relation Age of Onset   Diabetes Father    High blood pressure Father    Kidney disease Father        2 kdney transplants   Hypertension Paternal Grandmother    Diabetes Paternal Grandmother    Breast cancer Paternal Grandmother    Seizures Paternal Grandmother    Arthritis Paternal Grandmother    Asthma Paternal Grandmother    High Cholesterol Paternal Grandmother    Hypertension Paternal Grandfather    Arthritis Paternal Grandfather    Diabetes Paternal Grandfather    High Cholesterol Paternal Grandfather    Social History:  reports that she has quit smoking. Her smoking use included cigarettes. She has been exposed to tobacco smoke. She has never used smokeless tobacco. She reports that she does not drink alcohol and does not use drugs.  Allergies: No Known Allergies  Medications Prior to Admission  Medication Sig Dispense Refill   topiramate  (TOPAMAX ) 100 MG tablet Take 1 and 1/2 tablets twice a day  270 tablet 3   divalproex  (DEPAKOTE  ER) 250 MG 24 hr tablet Take 1 tablet every night (Patient not taking: Reported on 01/18/2024) 30 tablet 6   SUMAtriptan  (IMITREX ) 50 MG tablet Take 1 tablet at onset of migraine. May repeat in 2 hours if headache persists or recurs. Do not take more than 3 a week. (Patient not taking: Reported on 01/18/2024) 10 tablet 11    Results for orders placed or performed during the hospital encounter of 01/18/24 (from the past 48 hours)  Lipase, blood     Status: None   Collection Time: 01/18/24  4:07 PM  Result Value Ref Range   Lipase 43 11 - 51 U/L    Comment: Performed at [facility], [address] [place], [place] [address]  Comprehensive metabolic panel     Status: Abnormal   Collection Time: 01/18/24  4:07 PM  Result Value Ref Range   Sodium 134 (L) 135 - 145 mmol/L   Potassium 3.4 (L) 3.5 - 5.1 mmol/L   Chloride 99 98 - 111 mmol/L   CO2 24 22 - 32 mmol/L   Glucose, Bld 108 (H) 70 - 99 mg/dL    Comment: Glucose reference range applies only to samples taken after fasting for at least 8 hours.   BUN 13 6 - 20 mg/dL   Creatinine, Ser 9.19 0.44 - 1.00 mg/dL   Calcium 9.3 8.9 - 89.6 mg/dL  Total Protein 8.1 6.5 - 8.1 g/dL   Albumin 4.0 3.5 - 5.0 g/dL   AST 16 15 - 41 U/L   ALT 14 0 - 44 U/L   Alkaline Phosphatase 91 38 - 126 U/L   Total Bilirubin 0.8 0.0 - 1.2 mg/dL   GFR, Estimated >39 >39 mL/min    Comment: (NOTE) Calculated using the CKD-EPI Creatinine Equation (2021)    Anion gap 11 5 - 15    Comment: Performed at [facility], [address] [place], [place] [address]  CBC     Status: Abnormal   Collection Time: 01/18/24  4:07 PM  Result Value Ref Range   WBC 20.6 (H) 4.0 - 10.5 K/uL   RBC 4.64 3.87 - 5.11 MIL/uL   Hemoglobin 12.9 12.0 - 15.0 g/dL   HCT 59.9 63.9 - 53.9 %   MCV 86.2 80.0 - 100.0 fL   MCH 27.8 26.0 - 34.0 pg   MCHC 32.3 30.0 - 36.0 g/dL   RDW 86.9 88.4 - 84.4 %   Platelets 294  150 - 400 K/uL   nRBC 0.0 0.0 - 0.2 %    Comment: Performed at [facility], [address], [place], [place] [address]  hCG, serum, qualitative     Status: None   Collection Time: 01/18/24  4:07 PM  Result Value Ref Range   Preg, Serum NEGATIVE NEGATIVE    Comment:        THE SENSITIVITY OF THIS METHODOLOGY IS >10 mIU/mL. Performed at [facility] [address] [place], [place] [address]   Urinalysis, Routine w reflex microscopic -Urine, Clean Catch     Status: Abnormal   Collection Time: 01/18/24  4:37 PM  Result Value Ref Range   Color, Urine AMBER (A) YELLOW    Comment: BIOCHEMICALS MAY BE AFFECTED BY COLOR   APPearance HAZY (A) CLEAR   Specific Gravity, Urine 1.027 1.005 - 1.030   pH 5.0 5.0 - 8.0   Glucose, UA NEGATIVE NEGATIVE mg/dL   Hgb urine dipstick LARGE (A) NEGATIVE   Bilirubin Urine NEGATIVE NEGATIVE   Ketones, ur 20 (A) NEGATIVE mg/dL   Protein, ur 30 (A) NEGATIVE mg/dL   Nitrite NEGATIVE NEGATIVE   Leukocytes,Ua TRACE (A) NEGATIVE   RBC / HPF 11-20 0 - 5 RBC/hpf   WBC, UA 11-20 0 - 5 WBC/hpf   Bacteria, UA RARE (A) NONE SEEN   Squamous Epithelial / HPF 6-10 0 - 5 /HPF   Mucus PRESENT     Comment: Performed at [facility], [address] [place], [place] [address]  MRSA Next Gen by PCR, Nasal     Status: None   Collection Time: 01/18/24 10:32 PM   Specimen: Nasal Mucosa; Nasal Swab  Result Value Ref Range   MRSA by PCR Next Gen NOT DETECTED NOT DETECTED    Comment: (NOTE) The GeneXpert MRSA Assay (FDA approved for NASAL specimens only), is one component of a comprehensive MRSA colonization surveillance program. It is not intended to diagnose MRSA infection nor to guide or monitor treatment for MRSA infections. Test performance is not FDA approved in patients less than 77 years old. Performed at [facility], [address], [place], [place] [address]    CT ABDOMEN PELVIS W  CONTRAST Result Date: 01/18/2024 EXAM: CT ABDOMEN AND PELVIS WITH CONTRAST 01/18/2024 07:42:35 PM TECHNIQUE: CT of the abdomen and pelvis was performed with the administration of 100 mL of  iohexol (OMNIPAQUE) 300 MG/ML solution. Multiplanar reformatted images are provided for review. Automated exposure control, iterative reconstruction, and/or weight-based adjustment of the mA/kV was utilized to reduce the radiation dose to as low as reasonably achievable. COMPARISON: None available. CLINICAL HISTORY: RLQ abdominal pain FINDINGS: LOWER CHEST: No acute abnormality. LIVER: The liver is unremarkable. GALLBLADDER AND BILE DUCTS: Gallbladder is unremarkable. No biliary ductal dilatation. SPLEEN: No acute abnormality. PANCREAS: No acute abnormality. ADRENAL GLANDS: No acute abnormality. KIDNEYS, URETERS AND BLADDER: No stones in the kidneys or ureters. No hydronephrosis. No perinephric or periureteral stranding. Urinary bladder is unremarkable. GI AND BOWEL: Stomach demonstrates no acute abnormality. Fluid-filled appendix with caliber enlargement measuring up to 17 mm. Associated periappendiceal fat stranding and appendicolith within the appendiceal lumen base. No appendiceal wall discontinuity. No small or large bowel thickening or dilatation. Slight parasigmoid fat stranding appears to be reactive in etiology with adjacent appendix. Few scattered transverse colon diverticula. There is no bowel obstruction. PERITONEUM AND RETROPERITONEUM: No ascites. No free air. VASCULATURE: Aorta is normal in caliber. LYMPH NODES: No lymphadenopathy. REPRODUCTIVE ORGANS: Uterus is unremarkable. No adnexal mass. BONES AND SOFT TISSUES: Tiny fat containing umbilical hernia. No acute osseous abnormality. No focal soft tissue abnormality. IMPRESSION: 1. Acute appendicitis; no evidence of perforation. Electronically signed by: Morgane Naveau MD 01/18/2024 07:50 PM EST RP Workstation: [id number]    Review of Systems  HENT:  Negative  for ear discharge, ear pain, hearing loss and tinnitus.   Eyes:  Negative for photophobia and pain.  Respiratory:  Negative for cough and shortness of breath.   Cardiovascular:  Negative for chest pain.  Gastrointestinal:  Positive for abdominal pain. Negative for nausea and vomiting.  Genitourinary:  Negative for dysuria, flank pain, frequency and urgency.  Musculoskeletal:  Negative for back pain, myalgias and neck pain.  Neurological:  Positive for seizures. Negative for dizziness and headaches.  Hematological:  Does not bruise/bleed easily.  Psychiatric/Behavioral:  The patient is not nervous/anxious.     Blood pressure (!) 140/89, pulse (!) 109, temperature (!) 100.4 F (38 C), temperature source Oral, resp. rate 15, height 5' 6 (1.676 m), weight 104.3 kg, last menstrual period 01/02/2024, SpO2 99%, not currently breastfeeding. Physical Exam  Constitutional:  WDWN in NAD, conversant, no obvious deformities; lying in bed comfortably Eyes:  Pupils equal, round; sclera anicteric; moist conjunctiva; no lid lag HENT:  Oral mucosa moist; good dentition  Neck:  No masses palpated, trachea midline; no thyromegaly Lungs:  CTA bilaterally; normal respiratory effort CV:  Regular rate and rhythm; no murmurs; extremities well-perfused with no edema Abd:  +bowel sounds, soft, tender in RLQ, no palpable organomegaly; no palpable hernias Musc:  Unable to assess gait; no apparent clubbing or cyanosis in extremities Lymphatic:  No palpable cervical or axillary lymphadenopathy Skin:  Warm, dry; no sign of jaundice Psychiatric - alert and oriented x 4; calm mood and affect  Assessment/Plan Acute appendicitis  Recommend laparoscopic appendectomy.The surgical procedure has been discussed with the patient.  Potential risks, benefits, alternative treatments, and expected outcomes have been explained.  All of the patient's questions at this time have been answered.  The likelihood of reaching the  patient's treatment goal is good.  The patient understand the proposed surgical procedure and wishes to proceed.   Donnice MARLA Lima, MD 01/19/2024, 7:00 AM

## 2024-01-19 NOTE — Anesthesia Postprocedure Evaluation (Signed)
 Anesthesia Post Note  Patient: Natalie Lawrence  Procedure(s) Performed: APPENDECTOMY, LAPAROSCOPIC (Abdomen)     Patient location during evaluation: PACU Anesthesia Type: General Level of consciousness: awake and alert, patient cooperative and oriented Pain management: pain level controlled Vital Signs Assessment: post-procedure vital signs reviewed and stable Respiratory status: spontaneous breathing, nonlabored ventilation and respiratory function stable Cardiovascular status: blood pressure returned to baseline and stable Postop Assessment: no apparent nausea or vomiting Anesthetic complications: no   No notable events documented.  Last Vitals:  Vitals:   01/19/24 0900 01/19/24 0915  BP: 105/75 109/66  Pulse: 94 78  Resp: 17 15  Temp: 37.1 C   SpO2: 97% 95%    Last Pain:  Vitals:   01/19/24 0915  TempSrc:   PainSc: 0-No pain                 Ephraim Reichel,E. Rayna Brenner

## 2024-01-19 NOTE — Transfer of Care (Signed)
 Immediate Anesthesia Transfer of Care Note  Patient: Natalie Lawrence  Procedure(s) Performed: APPENDECTOMY, LAPAROSCOPIC (Abdomen)  Patient Location: PACU  Anesthesia Type:General  Level of Consciousness: awake, drowsy, and patient cooperative  Airway & Oxygen Therapy: Patient Spontanous Breathing and Patient connected to face mask oxygen  Post-op Assessment: Report given to RN and Post -op Vital signs reviewed and stable  Post vital signs: Reviewed and stable  Last Vitals:  Vitals Value Taken Time  BP 124/83 01/19/24 08:35  Temp 37.1 C 01/19/24 08:35  Pulse 103 01/19/24 08:37  Resp 17 01/19/24 08:37  SpO2 100 % 01/19/24 08:37  Vitals shown include unfiled device data.  Last Pain:  Vitals:   01/19/24 0835  TempSrc:   PainSc: Asleep      Patients Stated Pain Goal: 0 (01/18/24 2130)  Complications: No notable events documented.

## 2024-01-19 NOTE — Plan of Care (Signed)

## 2024-01-19 NOTE — Anesthesia Preprocedure Evaluation (Addendum)
 Anesthesia Evaluation  Patient identified by MRN, date of birth, ID band Patient awake    Reviewed: Allergy & Precautions, NPO status , Patient's Chart, lab work & pertinent test results  History of Anesthesia Complications Negative for: history of anesthetic complications  Airway Mallampati: I  TM Distance: >3 FB Neck ROM: Full    Dental  (+) Dental Advisory Given   Pulmonary former smoker   breath sounds clear to auscultation       Cardiovascular negative cardio ROS  Rhythm:Regular Rate:Normal     Neuro/Psych  Headaches, Seizures -, Poorly Controlled,   Anxiety Depression       GI/Hepatic Neg liver ROS,,,N/V with acute appy   Endo/Other  BMI 37  Renal/GU negative Renal ROS     Musculoskeletal   Abdominal   Peds  Hematology Hb 12.9, plt 294k   Anesthesia Other Findings   Reproductive/Obstetrics                              Anesthesia Physical Anesthesia Plan  ASA: 2  Anesthesia Plan: General   Post-op Pain Management: Ofirmev  IV (intra-op)*   Induction: Rapid sequence and Intravenous  PONV Risk Score and Plan: 3 and Scopolamine patch - Pre-op, Dexamethasone and Ondansetron   Airway Management Planned: Oral ETT  Additional Equipment: None  Intra-op Plan:   Post-operative Plan: Extubation in OR  Informed Consent: I have reviewed the patients History and Physical, chart, labs and discussed the procedure including the risks, benefits and alternatives for the proposed anesthesia with the patient or authorized representative who has indicated his/her understanding and acceptance.     Dental advisory given  Plan Discussed with: CRNA and Surgeon  Anesthesia Plan Comments:          Anesthesia Quick Evaluation

## 2024-01-20 ENCOUNTER — Encounter (HOSPITAL_COMMUNITY): Payer: Self-pay | Admitting: Surgery

## 2024-01-20 ENCOUNTER — Other Ambulatory Visit (HOSPITAL_COMMUNITY): Payer: Self-pay

## 2024-01-20 LAB — CBC
HCT: 33.7 % — ABNORMAL LOW (ref 36.0–46.0)
Hemoglobin: 10.4 g/dL — ABNORMAL LOW (ref 12.0–15.0)
MCH: 27.3 pg (ref 26.0–34.0)
MCHC: 30.9 g/dL (ref 30.0–36.0)
MCV: 88.5 fL (ref 80.0–100.0)
Platelets: 245 K/uL (ref 150–400)
RBC: 3.81 MIL/uL — ABNORMAL LOW (ref 3.87–5.11)
RDW: 13 % (ref 11.5–15.5)
WBC: 16.1 K/uL — ABNORMAL HIGH (ref 4.0–10.5)
nRBC: 0 % (ref 0.0–0.2)

## 2024-01-20 LAB — BASIC METABOLIC PANEL WITH GFR
Anion gap: 9 (ref 5–15)
BUN: 11 mg/dL (ref 6–20)
CO2: 20 mmol/L — ABNORMAL LOW (ref 22–32)
Calcium: 8.7 mg/dL — ABNORMAL LOW (ref 8.9–10.3)
Chloride: 110 mmol/L (ref 98–111)
Creatinine, Ser: 0.65 mg/dL (ref 0.44–1.00)
GFR, Estimated: >60 mL/min (ref >60)
Glucose, Bld: 108 mg/dL — ABNORMAL HIGH (ref 70–99)
Potassium: 3.7 mmol/L (ref 3.5–5.1)
Sodium: 139 mmol/L (ref 135–145)

## 2024-01-20 MED ORDER — OXYCODONE HCL 5 MG PO TABS
5.0000 mg | ORAL_TABLET | Freq: Four times a day (QID) | ORAL | 0 refills | Status: AC | PRN
  Filled 2024-01-20: qty 15, 4d supply, fill #0

## 2024-01-20 MED ORDER — ACETAMINOPHEN 500 MG PO TABS: 1000.0000 mg | ORAL_TABLET | Freq: Four times a day (QID) | ORAL | Status: AC | PRN

## 2024-01-20 MED ORDER — AMOXICILLIN-POT CLAVULANATE 875-125 MG PO TABS
1.0000 | ORAL_TABLET | Freq: Two times a day (BID) | ORAL | 0 refills | Status: AC
  Filled 2024-01-20: qty 10, 5d supply, fill #0
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
## 2024-01-20 NOTE — Plan of Care (Signed)
   Problem: Education: Goal: Knowledge of General Education information will improve Description Including pain rating scale, medication(s)/side effects and non-pharmacologic comfort measures Outcome: Progressing   Problem: Health Behavior/Discharge Planning: Goal: Ability to manage health-related needs will improve Outcome: Progressing

## 2024-01-20 NOTE — Discharge Instructions (Signed)

## 2024-01-20 NOTE — Progress Notes (Signed)
 Discharge medications delivered to patient at the bedside in a secure bag.

## 2024-01-20 NOTE — Progress Notes (Signed)
 Patient was given discharge instructions by [facility] RN, and all questions were answered. Patient was stable for discharge and will be taken to the main exit by wheelchair when her ride arrives.

## 2024-01-20 NOTE — Progress Notes (Signed)
   01/20/24 1006  TOC Brief Assessment  Insurance and Status Reviewed  Patient has primary care physician Yes  Home environment has been reviewed single family home  Prior level of function: independent  Prior/Current Home Services No current home services  Social Drivers of Health Review SDOH reviewed no interventions necessary  Readmission risk has been reviewed Yes  Transition of care needs no transition of care needs at this time    Signed: Heather Saltness, MSW, LCSW Clinical Social Worker Inpatient Care Management 01/20/2024 10:06 AM

## 2024-01-21 LAB — MISC LABCORP TEST (SEND OUT): Labcorp test code: 83935

## 2024-01-22 LAB — SURGICAL PATHOLOGY

## 2024-02-19 ENCOUNTER — Ambulatory Visit: Admitting: Neurology

## 2024-02-19 ENCOUNTER — Encounter: Payer: Self-pay | Admitting: Neurology

## 2024-02-19 VITALS — BP 116/81 | HR 63 | Ht 66.0 in | Wt 242.4 lb

## 2024-02-19 DIAGNOSIS — G43009 Migraine without aura, not intractable, without status migrainosus: Secondary | ICD-10-CM

## 2024-02-19 DIAGNOSIS — G40009 Localization-related (focal) (partial) idiopathic epilepsy and epileptic syndromes with seizures of localized onset, not intractable, without status epilepticus: Secondary | ICD-10-CM | POA: Diagnosis not present

## 2024-02-19 MED ORDER — TOPIRAMATE 100 MG PO TABS: ORAL_TABLET | ORAL | 3 refills | Status: AC

## 2024-02-19 NOTE — Progress Notes (Unsigned)
 Medication Samples have been provided to the patient.  Drug name: aptiom       Strength: 200 mg        Qty: 2  LOT: [id number]  Exp.Date: 10/2024  Dosing instructions: take as directed   The patient has been instructed regarding the correct time, dose, and frequency of taking this medication, including desired effects and most common side effects.
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
## 2024-02-19 NOTE — Progress Notes (Unsigned)
 NEUROLOGY FOLLOW UP OFFICE NOTE  Natalie Lawrence [id number] 09/07/1990  HISTORY OF PRESENT ILLNESS: I had the pleasure of seeing Natalie Lawrence in follow-up in the neurology clinic on 02/19/2024.  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 She is alone in the office today. Records and images were personally reviewed where available.  ***.  Last Monday In Oct had one of the feelings, slowly dropped to my knees and went to the ground Feelings are pretty frequent during the week, a few times during the week Moved to new house in Nov; a lot of stress, having more sz    The patient had virtual video visit on 10/29/2023. She was last evaluated in the Neurology clinic a month ago for intractable epilepsy. On her last visit, we discussed consideration for VNS and adding low dose Depakote  for seizure and migraine prophylaxis. She did not start the Depakote  due to concern for similar side effects to Clobazam  that affected her mental health. She contacted our office on 8/12 that she was having the seizure-like feeling on a near daily basis. That morning, she felt it then lost consciousness and woke up on the floor with a severe headache. She was instructed to increase the Topiramate  to 150mg  BID. That evening, she had another one lasting 2 minutes and was told it was the longest and more severe than prior ones. She bit both sides and the front of her tongue. She feels like her memory is getting worse, she keeps reminding herself and writes little sticky notes but keeps forgetting. She denies any seizures since 8/12 but continues to have the seizure-like feelings with dizziness and tingling almost daily. Her migraines are also bad, the first day is worst then the pressure loosens up in the next 2-3 days.     History on Initial Assessment 08/28/2019: The patient was seen as a virtual video visit on 08/28/2019. She has seen several neurologists, records have been reviewed and will be  summarized as follows. She is a 33 year old right-handed woman with a history of seizures diagnosed at age 83. She states majority of her seizures are nocturnal. The first seizure occurred in 2015. She is noted to have convulsions with head version to the left. She would feel hot and cold flashes followed by dizziness prior to the convulsions. She has had only 2-3 seizures during wakefulness, last occurred a year ago. Sometimes she gets a warning up to 1 hour prior to the convulsions. She is confused and amnestic of events, sleepy after, with headaches and jaw pain. No focal weakness. She had been seeing Dr. Maree at [facility] from 2018-2019, then Dr. Ines last 05/2019 on the third trimester of her pregnancy. On her visit with Dr. Ines, she was noted to have seizures in February, March, she reports a seizure at the end of May/beginning of June, then her last seizure was 08/18/19. Triggers include heat, stress, when she is overworked. She has occasional episodes where she feels very hot and dizzy like she will fall. She denies any olfactory/gustatory hallucinations, deja vu, rising epigastric sensation, focal numbness/tingling/weakness, myoclonic jerks. She denies any staring/unresponsive episodes or gaps in time. She has tried Levetiracetam , Lamotrigine, carbamazepine, oxcarbazepine, phenytoin. She was last prescribed Levetiracetam  during her visit with Dr. Ines in 05/2019, however she would start and stop it during her pregnancies, she is not taking seizure medication. She states it was the 4th or 5th medication, the worst I've been on. She does not  think it works, she feels spaced out and her depth perception would be different.   She has migraines 4-5 times a month, mostly on the right side with occasional nausea. She does not take any prn medications. She denies any diplopia, dysarthria/dysphagia, neck/back pain, bowel/bladder dysfunction. She feels her memory started to fade after seizures, she  could not remember things from 2 days ago. She has 2 children, a 31 and a half year old and her 60 month old baby. She is breastfeeding and does not want to start any seizure medications at this time. She has no future pregnancy plans, she has had tubal ligation.  Prior AEDs: Levetiracetam , Lamotrigine, oxcarbazepine, Phenytoin, carbamazepine, Zonisamide , Xcopri , Epidiolex , Clobazam  (suicidal ideation), Depakote   Prior migraine rescue: Maxalt    Diagnostic Data: EEGs: Routine EEG in 01/2016 showed diffuse beta activity, otherwise normal.  72-hour EEG in 12/2017 was abnormal due to right > left frontal epileptiform discharges, typical events not captured.  72-hour EEG in 08/2022 which was normal. She reported headaches, facial tingling, with no EEG correlate seen  MRI: MRI brain with and without contrast 10/2019 normal, few small foci of T2 and FLAIR signal in the frontal white matter, a common finding.  PAST MEDICAL HISTORY: Past Medical History:  Diagnosis Date   Anxiety    Depression 2021   Epilepsy (HCC)    Epilepsy (HCC) 04/2013   GBS (group B Streptococcus carrier), +RV culture, currently pregnant 07/01/2019   Seizures (HCC)     MEDICATIONS: Medications Ordered Prior to Encounter[1]  ALLERGIES: Allergies[2]  FAMILY HISTORY: Family History  Problem Relation Age of Onset   Diabetes Father    High blood pressure Father    Kidney disease Father        2 kdney transplants   Hypertension Paternal Grandmother    Diabetes Paternal Grandmother    Breast cancer Paternal Grandmother    Seizures Paternal Grandmother    Arthritis Paternal Grandmother    Asthma Paternal Grandmother    High Cholesterol Paternal Grandmother    Hypertension Paternal Grandfather    Arthritis Paternal Grandfather    Diabetes Paternal Grandfather    High Cholesterol Paternal Grandfather     SOCIAL HISTORY: Social History   Socioeconomic History   Marital status: Single    Spouse name: Not on file    Number of children: 4   Years of education: Not on file   Highest education level: 12th grade  Occupational History   Not on file  Tobacco Use   Smoking status: Former    Types: Cigarettes    Passive exposure: Past   Smokeless tobacco: Never  Vaping Use   Vaping status: Never Used  Substance and Sexual Activity   Alcohol use: No   Drug use: No    Comment: h/o smoking marijuana    Sexual activity: Yes  Other Topics Concern   Not on file  Social History Narrative   Lives with boyfriend and children in a one story home   Right handed   Caffeine: none   Social Drivers of Health   Tobacco Use: Medium Risk (02/19/2024)   Patient History    Smoking Tobacco Use: Former    Smokeless Tobacco Use: Never    Passive Exposure: Past  Physicist, Medical Strain: Low Risk (12/20/2022)   Overall Financial Resource Strain (CARDIA)    Difficulty of Paying Living Expenses: Not hard at all  Food Insecurity: No Food Insecurity (01/18/2024)   Epic    Worried About Running Out of  Food in the Last Year: Never true    Ran Out of Food in the Last Year: Never true  Transportation Needs: No Transportation Needs (01/18/2024)   Epic    Lack of Transportation (Medical): No    Lack of Transportation (Non-Medical): No  Physical Activity: Insufficiently Active (12/20/2022)   Exercise Vital Sign    Days of Exercise per Week: 2 days    Minutes of Exercise per Session: 10 min  Stress: Stress Concern Present (12/20/2022)   [employer] of Occupational Health - Occupational Stress Questionnaire    Feeling of Stress : Very much  Social Connections: Unknown (12/20/2022)   Social Connection and Isolation Panel    Frequency of Communication with Friends and Family: Twice a week    Frequency of Social Gatherings with Friends and Family: Once a week    Attends Religious Services: Patient declined    Active Member of Clubs or Organizations: No    Attends Engineer, Structural: Not on file     Marital Status: Never married  Intimate Partner Violence: Not At Risk (01/18/2024)   Epic    Fear of Current or Ex-Partner: No    Emotionally Abused: No    Physically Abused: No    Sexually Abused: No  Depression (PHQ2-9): Low Risk (12/20/2022)   Depression (PHQ2-9)    PHQ-2 Score: 1  Alcohol Screen: Low Risk (08/22/2023)   Alcohol Screen    Last Alcohol Screening Score (AUDIT): 0  Housing: Low Risk (01/18/2024)   Epic    Unable to Pay for Housing in the Last Year: No    Number of Times Moved in the Last Year: 0    Homeless in the Last Year: No  Utilities: Not At Risk (01/18/2024)   Epic    Threatened with loss of utilities: No  Health Literacy: Not on file     PHYSICAL EXAM: Vitals:   02/19/24 1429  BP: 116/81  Pulse: 63  SpO2: 100%   General: No acute distress Head:  Normocephalic/atraumatic Skin/Extremities: No rash, no edema Neurological Exam: alert and oriented to person, place, and time. No aphasia or dysarthria. Fund of knowledge is appropriate.  Recent and remote memory are intact.  Attention and concentration are normal.   Cranial nerves: Pupils equal, round. Extraocular movements intact with no nystagmus. Visual fields full.  No facial asymmetry.  Motor: Bulk and tone normal, muscle strength 5/5 throughout with no pronator drift.   Finger to nose testing intact.  Gait narrow-based and steady, able to tandem walk adequately.  Romberg negative.   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 Prior prolonged EEG reported right > left frontal epileptiform discharges. MRI brain no acute changes. She has had side effects to multiple medications and continues to have frequent seizures, last seizures was 8/12. She is on Topiramate  150mg  BID. She continues to report daily seizure-like feeling and migraines, and states she will start the Depakote  ER 250mg  at bedtime. She is half-half on the  fence with the VNS, scared about surgery and having a device in her body, she is agreeable to talking to one of the VNS patient ambassadors. A prescription for prn sumatriptan  50mg  was sent for migraine rescue, side effects discussed. She does not drive. Follow-up in 3 months, call for any changes.     Thank you for allowing me to participate in *** care.  Please do not hesitate to call  for any questions or concerns.  The duration of this appointment visit was *** minutes of face-to-face time with the patient.  Greater than 50% of this time was spent in counseling, explanation of diagnosis, planning of further management, and coordination of care.   Darice Shivers, M.D.   CC: ***       [1]  Current Outpatient Medications on File Prior to Visit  Medication Sig Dispense Refill   acetaminophen  (TYLENOL ) 500 MG tablet Take 2 tablets (1,000 mg total) by mouth every 6 (six) hours as needed for mild pain (pain score 1-3) or fever (or temp > 100).     topiramate  (TOPAMAX ) 100 MG tablet Take 1 and 1/2 tablets twice a day 270 tablet 3   divalproex  (DEPAKOTE  ER) 250 MG 24 hr tablet Take 1 tablet every night (Patient not taking: Reported on 01/18/2024) 30 tablet 6   oxyCODONE  (OXY IR/ROXICODONE ) 5 MG immediate release tablet Take 1 tablet (5 mg total) by mouth every 6 (six) hours as needed (pain). 15 tablet 0   SUMAtriptan  (IMITREX ) 50 MG tablet Take 1 tablet at onset of migraine. May repeat in 2 hours if headache persists or recurs. Do not take more than 3 a week. (Patient not taking: Reported on 01/18/2024) 10 tablet 11   No current facility-administered medications on file prior to visit.  [2] No Known Allergies

## 2024-05-29 ENCOUNTER — Ambulatory Visit: Payer: Self-pay | Admitting: Neurology
